# Patient Record
Sex: Female | Born: 1990 | Race: White | Hispanic: No | Marital: Married | Smoking: Never smoker
Health system: Southern US, Community
[De-identification: ages and names within clinical notes are randomized; demographics above are authoritative.]

## PROBLEM LIST (undated history)

## (undated) ENCOUNTER — Inpatient Hospital Stay: Payer: Self-pay

## (undated) DIAGNOSIS — D269 Other benign neoplasm of uterus, unspecified: Secondary | ICD-10-CM

## (undated) DIAGNOSIS — F32A Depression, unspecified: Secondary | ICD-10-CM

## (undated) DIAGNOSIS — D649 Anemia, unspecified: Secondary | ICD-10-CM

## (undated) DIAGNOSIS — F419 Anxiety disorder, unspecified: Secondary | ICD-10-CM

## (undated) DIAGNOSIS — E282 Polycystic ovarian syndrome: Secondary | ICD-10-CM

## (undated) DIAGNOSIS — J45909 Unspecified asthma, uncomplicated: Secondary | ICD-10-CM

## (undated) HISTORY — DX: Unspecified asthma, uncomplicated: J45.909

## (undated) HISTORY — DX: Anemia, unspecified: D64.9

## (undated) HISTORY — PX: WISDOM TOOTH EXTRACTION: SHX21

## (undated) HISTORY — DX: Polycystic ovarian syndrome: E28.2

## (undated) HISTORY — DX: Other benign neoplasm of uterus, unspecified: D26.9

## (undated) HISTORY — DX: Depression, unspecified: F32.A

## (undated) HISTORY — DX: Anxiety disorder, unspecified: F41.9

## (undated) HISTORY — PX: GASTRIC ROUX-EN-Y: SHX5262

---
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
## 2013-09-08 HISTORY — PX: WISDOM TOOTH EXTRACTION: SHX21

## 2017-01-04 DIAGNOSIS — E282 Polycystic ovarian syndrome: Secondary | ICD-10-CM | POA: Insufficient documentation

## 2017-02-24 HISTORY — PX: LAPAROSCOPIC GASTRIC BYPASS: SUR771

## 2017-04-13 ENCOUNTER — Encounter: Payer: Self-pay | Admitting: Obstetrics and Gynecology

## 2017-04-13 ENCOUNTER — Ambulatory Visit (INDEPENDENT_AMBULATORY_CARE_PROVIDER_SITE_OTHER): Payer: BC Managed Care – PPO | Admitting: Obstetrics and Gynecology

## 2017-04-13 VITALS — BP 110/76 | HR 83 | Ht 61.0 in | Wt 201.0 lb

## 2017-04-13 DIAGNOSIS — Z01419 Encounter for gynecological examination (general) (routine) without abnormal findings: Secondary | ICD-10-CM

## 2017-04-13 MED ORDER — NORETHIN-ETH ESTRAD-FE BIPHAS 1 MG-10 MCG / 10 MCG PO TABS: 1.0000 | ORAL_TABLET | Freq: Every day | ORAL | 3 refills | Status: DC

## 2017-04-13 NOTE — Patient Instructions (Signed)
Preventive Care 18-39 Years, Female Preventive care refers to lifestyle choices and visits with your health care provider that can promote health and wellness. What does preventive care include?  A yearly physical exam. This is also called an annual well check.  Dental exams once or twice a year.  Routine eye exams. Ask your health care provider how often you should have your eyes checked.  Personal lifestyle choices, including: ? Daily care of your teeth and gums. ? Regular physical activity. ? Eating a healthy diet. ? Avoiding tobacco and drug use. ? Limiting alcohol use. ? Practicing safe sex. ? Taking vitamin and mineral supplements as recommended by your health care provider. What happens during an annual well check? The services and screenings done by your health care provider during your annual well check will depend on your age, overall health, lifestyle risk factors, and family history of disease. Counseling Your health care provider may ask you questions about your:  Alcohol use.  Tobacco use.  Drug use.  Emotional well-being.  Home and relationship well-being.  Sexual activity.  Eating habits.  Work and work Statistician.  Method of birth control.  Menstrual cycle.  Pregnancy history.  Screening You may have the following tests or measurements:  Height, weight, and BMI.  Diabetes screening. This is done by checking your blood sugar (glucose) after you have not eaten for a while (fasting).  Blood pressure.  Lipid and cholesterol levels. These may be checked every 5 years starting at age 66.  Skin check.  Hepatitis C blood test.  Hepatitis B blood test.  Sexually transmitted disease (STD) testing.  BRCA-related cancer screening. This may be done if you have a family history of breast, ovarian, tubal, or peritoneal cancers.  Pelvic exam and Pap test. This may be done every 3 years starting at age 40. Starting at age 59, this may be done every 5  years if you have a Pap test in combination with an HPV test.  Discuss your test results, treatment options, and if necessary, the need for more tests with your health care provider. Vaccines Your health care provider may recommend certain vaccines, such as:  Influenza vaccine. This is recommended every year.  Tetanus, diphtheria, and acellular pertussis (Tdap, Td) vaccine. You may need a Td booster every 10 years.  Varicella vaccine. You may need this if you have not been vaccinated.  HPV vaccine. If you are 69 or younger, you may need three doses over 6 months.  Measles, mumps, and rubella (MMR) vaccine. You may need at least one dose of MMR. You may also need a second dose.  Pneumococcal 13-valent conjugate (PCV13) vaccine. You may need this if you have certain conditions and were not previously vaccinated.  Pneumococcal polysaccharide (PPSV23) vaccine. You may need one or two doses if you smoke cigarettes or if you have certain conditions.  Meningococcal vaccine. One dose is recommended if you are age 27-21 years and a first-year college student living in a residence hall, or if you have one of several medical conditions. You may also need additional booster doses.  Hepatitis A vaccine. You may need this if you have certain conditions or if you travel or work in places where you may be exposed to hepatitis A.  Hepatitis B vaccine. You may need this if you have certain conditions or if you travel or work in places where you may be exposed to hepatitis B.  Haemophilus influenzae type b (Hib) vaccine. You may need this if  you have certain risk factors.  Talk to your health care provider about which screenings and vaccines you need and how often you need them. This information is not intended to replace advice given to you by your health care provider. Make sure you discuss any questions you have with your health care provider. Document Released: 10/21/2001 Document Revised: 05/14/2016  Document Reviewed: 06/26/2015 Elsevier Interactive Patient Education  2017 [employer].

## 2017-04-13 NOTE — Progress Notes (Signed)
Patient ID: Wendy Johnston, female   DOB: [birth year], 26 y.o.   MRN: [id number]     Gynecology Annual Exam  PCP: Practice, [facility]  Chief Complaint:  Chief Complaint  Patient presents with  . Gynecologic Exam    History of Present Illness: Patient is a 26 y.o. G0P0000 presents for annual exam. The patient has no complaints today.   LMP: Patient's last menstrual period was 02/05/2017. Has had one menstrual cycle since her gastric bypass, lost 30lbs.  She completed 3 cycles of IUI before her bypass.  Is interested in contraception until cleared to try to achieve pregnancy by her bariatric surgeon.  The patient is sexually active. She currently uses none for contraception. She denies dyspareunia.  The patient does perform self breast exams.  There is no notable family history of breast or ovarian cancer in her family.  The patient wears seatbelts: yes.  The patient has regular exercise: not asked.    The patient denies current symptoms of depression.    Review of Systems: Review of Systems  Constitutional: Negative for chills and fever.  HENT: Negative for congestion.   Respiratory: Negative for cough and shortness of breath.   Cardiovascular: Negative for chest pain and palpitations.  Gastrointestinal: Negative for abdominal pain, constipation, diarrhea, heartburn, nausea and vomiting.  Genitourinary: Negative for dysuria, frequency and urgency.  Skin: Negative for itching and rash.  Neurological: Negative for dizziness and headaches.  Endo/Heme/Allergies: Negative for polydipsia.  Psychiatric/Behavioral: Negative for depression.    Past Medical History:  History reviewed. No pertinent past medical history.  Past Surgical History:  Past Surgical History:  Procedure Laterality Date  . 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 Contraception: none Last Pap: Results were: 03/14/15 no  abnormalities   Obstetric History: G0P0000  Family History:  History reviewed. No pertinent family history.  Social History:  Social History   Social History  . Marital status: Married    Spouse name: N/A  . Number of children: N/A  . Years of education: N/A   Occupational History  . Not on file.   Social History Main Topics  . Smoking status: Never Smoker  . Smokeless tobacco: Never Used  . Alcohol use No  . Drug use: No  . Sexual activity: Yes    Partners: Male    Birth control/ protection: None   Other Topics Concern  . Not on file   Social History Narrative  . No narrative on file    Allergies:  Allergies no known allergies  Medications: Prior to Admission medications   Medication Sig Start Date End Date Taking? Authorizing Provider  escitalopram (LEXAPRO) 10 MG tablet Take 10 mg by mouth daily.   Yes [provider]    Physical Exam Vitals: Blood pressure 110/76, pulse 83, height 5\' 1"  (1.549 m), weight 201 lb (91.2 kg), last menstrual period 02/05/2017.  General: NAD HEENT: normocephalic, anicteric Thyroid: no enlargement, no palpable nodules Pulmonary: No increased work of breathing, CTAB Cardiovascular: RRR, distal pulses 2+ Breast: Breast symmetrical, no tenderness, no palpable nodules or masses, no skin or nipple retraction present, no nipple discharge.  No axillary or supraclavicular lymphadenopathy. Abdomen: NABS, soft, non-tender, non-distended.  Umbilicus without lesions.  No hepatomegaly, splenomegaly or masses palpable. No evidence of hernia  Genitourinary:  External: Normal external female genitalia.  Normal urethral meatus, normal  Bartholin's and Skene's glands.    Vagina: Normal vaginal mucosa, no  evidence of prolapse.    Cervix: Grossly normal in appearance, no bleeding  Uterus: Non-enlarged, mobile, normal contour.  No CMT  Adnexa: ovaries non-enlarged, no adnexal masses  Rectal: deferred  Lymphatic: no evidence of inguinal  lymphadenopathy Extremities: no edema, erythema, or tenderness Neurologic: Grossly intact Psychiatric: mood appropriate, affect full  Female chaperone present for pelvic and breast  portions of the physical exam    Assessment: 26 y.o. [place] annual exam  Plan: Problem List Items Addressed This Visit    None    Visit Diagnoses    Encounter for gynecological examination without abnormal finding    -  Primary      1) 4) Gardasil Series discussed and if applicable offered to patient - Patient has previously completed 3 shot series   2) STI screening was offered and declined   3) ASCCP guidelines and rational discussed.  Patient opts for every 3 years screening interval  4) Contraception - Education given regarding options for contraception, including barrier methods, injectable contraception, IUD placement, oral contraceptives, NuvaRing, Nexplanon.  We discussed safe sex practices to reduce her furture risk of STI's.  Patient is interested in OCP  - Rx lo loestrin fe  5) Follow up 1 year for routine annual exam

## 2018-06-11 ENCOUNTER — Other Ambulatory Visit: Payer: Self-pay | Admitting: Obstetrics and Gynecology

## 2018-06-16 ENCOUNTER — Telehealth: Payer: Self-pay | Admitting: Obstetrics and Gynecology

## 2018-06-16 NOTE — Telephone Encounter (Signed)
Called and left voicemail for patient to call back to be schedule.mychart message sent

## 2018-06-16 NOTE — Telephone Encounter (Signed)
-----   Message from Malachy Mood, MD sent at 06/11/2018  8:01 PM EDT ----- Regarding: Needs annual  Patient needs annual exam.   Malachy Mood, MD, [facility], [facility] 06/11/2018, 8:01 PM

## 2018-07-06 ENCOUNTER — Other Ambulatory Visit: Payer: Self-pay

## 2018-07-06 MED ORDER — NORETHIN-ETH ESTRAD-FE BIPHAS 1 MG-10 MCG / 10 MCG PO TABS: 1.0000 | ORAL_TABLET | Freq: Every day | ORAL | 0 refills | Status: DC

## 2018-07-27 ENCOUNTER — Ambulatory Visit (INDEPENDENT_AMBULATORY_CARE_PROVIDER_SITE_OTHER): Payer: BC Managed Care – PPO | Admitting: Obstetrics and Gynecology

## 2018-07-27 ENCOUNTER — Other Ambulatory Visit (HOSPITAL_COMMUNITY)
Admission: RE | Admit: 2018-07-27 | Discharge: 2018-07-27 | Disposition: A | Discharge status: Home / Self Care | Payer: BC Managed Care – PPO | Source: Ambulatory Visit | Attending: Obstetrics and Gynecology | Admitting: Obstetrics and Gynecology

## 2018-07-27 ENCOUNTER — Encounter: Payer: Self-pay | Admitting: Obstetrics and Gynecology

## 2018-07-27 VITALS — BP 110/60 | HR 82 | Wt 160.0 lb

## 2018-07-27 DIAGNOSIS — Z01419 Encounter for gynecological examination (general) (routine) without abnormal findings: Secondary | ICD-10-CM | POA: Diagnosis not present

## 2018-07-27 DIAGNOSIS — Z124 Encounter for screening for malignant neoplasm of cervix: Secondary | ICD-10-CM

## 2018-07-27 DIAGNOSIS — Z113 Encounter for screening for infections with a predominantly sexual mode of transmission: Secondary | ICD-10-CM | POA: Diagnosis present

## 2018-07-27 DIAGNOSIS — Z1239 Encounter for other screening for malignant neoplasm of breast: Secondary | ICD-10-CM

## 2018-07-27 MED ORDER — NORETHIN-ETH ESTRAD-FE BIPHAS 1 MG-10 MCG / 10 MCG PO TABS: 1.0000 | ORAL_TABLET | Freq: Every day | ORAL | 3 refills | Status: DC

## 2018-07-27 NOTE — Progress Notes (Signed)
Gynecology Annual Exam   PCP: Practice, [facility]  Chief Complaint:  Chief Complaint  Patient presents with  . Gynecologic Exam    History of Present Illness: Patient is a 27 y.o. G0P0000 presents for annual exam. The patient has no complaints today.   LMP: Patient's last menstrual period was 07/07/2018 (exact date). Average Interval: regular, 28 days Duration of flow: 3 days Heavy Menses: no Clots: no Intermenstrual Bleeding: no Postcoital Bleeding: no Dysmenorrhea: no  The patient is sexually active. She currently uses OCP (estrogen/progesterone) for contraception. She denies dyspareunia.  The patient does perform self breast exams.  There is no notable family history of breast or ovarian cancer in her family.  The patient wears seatbelts: yes.   The patient has regular exercise: not asked.    The patient denies current symptoms of depression.    Review of Systems: Review of Systems  Constitutional: Negative for chills and fever.  HENT: Negative for congestion.   Respiratory: Negative for cough and shortness of breath.   Cardiovascular: Negative for chest pain and palpitations.  Gastrointestinal: Negative for abdominal pain, constipation, diarrhea, heartburn, nausea and vomiting.  Genitourinary: Negative for dysuria, frequency and urgency.  Skin: Negative for itching and rash.  Neurological: Negative for dizziness and headaches.  Endo/Heme/Allergies: Negative for polydipsia.  Psychiatric/Behavioral: Negative for depression.    Past Medical History:  History reviewed. No pertinent past medical history.  Past Surgical History:  Past Surgical History:  Procedure Laterality Date  . 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 Contraception: OCP (estrogen/progesterone) Last Pap: Results were:03/13/16 no abnormalities   Obstetric History: G0P0000  Family History:    Family History  Problem Relation Age of Onset  . Cervical cancer Mother 59       total hysterectomy    Social History:  Social History   Socioeconomic History  . Marital status: Married    Spouse name: Not on file  . Number of children: Not on file  . Years of education: Not on file  . Highest education level: Not on file  Occupational History  . Not on file  Social Needs  . Financial resource strain: Not on file  . Food insecurity:    Worry: Not on file    Inability: Not on file  . Transportation needs:    Medical: Not on file    Non-medical: Not on file  Tobacco Use  . Smoking status: Never Smoker  . Smokeless tobacco: Never Used  Substance and Sexual Activity  . Alcohol use: No  . Drug use: No  . Sexual activity: Yes    Partners: Male    Birth control/protection: Pill  Lifestyle  . Physical activity:    Days per week: Not on file    Minutes per session: Not on file  . Stress: Not on file  Relationships  . Social connections:    Talks on phone: Not on file    Gets together: Not on file    Attends religious service: Not on file    Active member of club or organization: Not on file    Attends meetings of clubs or organizations: Not on file    Relationship status: Not on file  . Intimate partner violence:    Fear of current or ex partner: Not on file    Emotionally abused: Not on file    Physically abused: Not on file  Forced sexual activity: Not on file  Other Topics Concern  . Not on file  Social History Narrative  . Not on file    Allergies:  No Known Allergies  Medications: Prior to Admission medications   Medication Sig Start Date End Date Taking? Authorizing Provider  escitalopram (LEXAPRO) 10 MG tablet Take 10 mg by mouth daily.   Yes [provider]  Norethindrone-Ethinyl Estradiol-Fe Biphas (LO LOESTRIN FE) 1 MG-10 MCG / 10 MCG tablet Take 1 tablet by mouth daily. 07/06/18  Yes Malachy Mood, MD    Physical Exam Vitals:  Blood pressure 110/60, pulse 82, weight 160 lb (72.6 kg), last menstrual period 07/07/2018.  General: NAD HEENT: normocephalic, anicteric Thyroid: no enlargement, no palpable nodules Pulmonary: No increased work of breathing, CTAB Cardiovascular: RRR, distal pulses 2+ Breast: Breast symmetrical, no tenderness, no palpable nodules or masses, no skin or nipple retraction present, no nipple discharge.  No axillary or supraclavicular lymphadenopathy. Abdomen: NABS, soft, non-tender, non-distended.  Umbilicus without lesions.  No hepatomegaly, splenomegaly or masses palpable. No evidence of hernia  Genitourinary:  External: Normal external female genitalia.  Normal urethral meatus, normal Bartholin's and Skene's glands.    Vagina: Normal vaginal mucosa, no evidence of prolapse.    Cervix: Grossly normal in appearance, no bleeding  Uterus: Non-enlarged, mobile, normal contour.  No CMT  Adnexa: ovaries non-enlarged, no adnexal masses  Rectal: deferred  Lymphatic: no evidence of inguinal lymphadenopathy Extremities: no edema, erythema, or tenderness Neurologic: Grossly intact Psychiatric: mood appropriate, affect full  Female chaperone present for pelvic and breast  portions of the physical exam    Assessment: 27 y.o. G0P0000 routine annual exam  Plan: Problem List Items Addressed This Visit    None    Visit Diagnoses    Encounter for gynecological examination without abnormal finding    -  Primary   Screening for malignant neoplasm of cervix       Relevant Orders   HEP, RPR, HIV Panel   Breast screening       Routine screening for STI (sexually transmitted infection)       Relevant Orders   Cytology - PAP   HEP, RPR, HIV Panel      2) STI screening  wasoffered and accepted  2)  ASCCP guidelines and rational discussed.  Patient opts for every 3 years screening interval  3) Contraception - the patient is currently using  OCP (estrogen/progesterone).  She is interested in  starting Contraception: IUD.  Patient plans IUD, and will schedule soon.  Risks and benefits of IUD discussed including the risks of irregular bleeding, cramping, infection, malpositioning, expulsion or uterine perforation of the IUD (1:1000 placements)  which may require further procedures such as laparoscopy.  IUDs while effective at preventing pregnancy do not prevent transmission of sexually transmitted diseases and use of barrier methods for this purpose was discussed.  Low overall incidence of failure with 99.7% efficacy rate in typical use.  The patient has not contraindication to IUD placement.  4) Routine healthcare maintenance including cholesterol, diabetes screening discussed managed by PCP  5) Return in about 1 year (around 07/28/2019) for annual.   Malachy Mood, MD, [place] 07/27/2018, 3:11 PM

## 2018-07-27 NOTE — Patient Instructions (Signed)
Preventive Care 18-39 Years, Female Preventive care refers to lifestyle choices and visits with your health care provider that can promote health and wellness. What does preventive care include?  A yearly physical exam. This is also called an annual well check.  Dental exams once or twice a year.  Routine eye exams. Ask your health care provider how often you should have your eyes checked.  Personal lifestyle choices, including: ? Daily care of your teeth and gums. ? Regular physical activity. ? Eating a healthy diet. ? Avoiding tobacco and drug use. ? Limiting alcohol use. ? Practicing safe sex. ? Taking vitamin and mineral supplements as recommended by your health care provider. What happens during an annual well check? The services and screenings done by your health care provider during your annual well check will depend on your age, overall health, lifestyle risk factors, and family history of disease. Counseling Your health care provider may ask you questions about your:  Alcohol use.  Tobacco use.  Drug use.  Emotional well-being.  Home and relationship well-being.  Sexual activity.  Eating habits.  Work and work Statistician.  Method of birth control.  Menstrual cycle.  Pregnancy history.  Screening You may have the following tests or measurements:  Height, weight, and BMI.  Diabetes screening. This is done by checking your blood sugar (glucose) after you have not eaten for a while (fasting).  Blood pressure.  Lipid and cholesterol levels. These may be checked every 5 years starting at age 66.  Skin check.  Hepatitis C blood test.  Hepatitis B blood test.  Sexually transmitted disease (STD) testing.  BRCA-related cancer screening. This may be done if you have a family history of breast, ovarian, tubal, or peritoneal cancers.  Pelvic exam and Pap test. This may be done every 3 years starting at age 40. Starting at age 59, this may be done every 5  years if you have a Pap test in combination with an HPV test.  Discuss your test results, treatment options, and if necessary, the need for more tests with your health care provider. Vaccines Your health care provider may recommend certain vaccines, such as:  Influenza vaccine. This is recommended every year.  Tetanus, diphtheria, and acellular pertussis (Tdap, Td) vaccine. You may need a Td booster every 10 years.  Varicella vaccine. You may need this if you have not been vaccinated.  HPV vaccine. If you are 69 or younger, you may need three doses over 6 months.  Measles, mumps, and rubella (MMR) vaccine. You may need at least one dose of MMR. You may also need a second dose.  Pneumococcal 13-valent conjugate (PCV13) vaccine. You may need this if you have certain conditions and were not previously vaccinated.  Pneumococcal polysaccharide (PPSV23) vaccine. You may need one or two doses if you smoke cigarettes or if you have certain conditions.  Meningococcal vaccine. One dose is recommended if you are age 27-21 years and a first-year college student living in a residence hall, or if you have one of several medical conditions. You may also need additional booster doses.  Hepatitis A vaccine. You may need this if you have certain conditions or if you travel or work in places where you may be exposed to hepatitis A.  Hepatitis B vaccine. You may need this if you have certain conditions or if you travel or work in places where you may be exposed to hepatitis B.  Haemophilus influenzae type b (Hib) vaccine. You may need this if  you have certain risk factors.  Talk to your health care provider about which screenings and vaccines you need and how often you need them. This information is not intended to replace advice given to you by your health care provider. Make sure you discuss any questions you have with your health care provider. Document Released: 10/21/2001 Document Revised: 05/14/2016  Document Reviewed: 06/26/2015 Elsevier Interactive Patient Education  [employer].

## 2018-07-28 LAB — HEP, RPR, HIV PANEL
HEP B S AG: NEGATIVE
HIV SCREEN 4TH GENERATION: NONREACTIVE
RPR: NONREACTIVE

## 2018-07-30 LAB — CYTOLOGY - PAP
CHLAMYDIA, DNA PROBE: NEGATIVE
Diagnosis: NEGATIVE
NEISSERIA GONORRHEA: NEGATIVE

## 2020-07-06 ENCOUNTER — Ambulatory Visit: Payer: BC Managed Care – PPO | Admitting: Obstetrics and Gynecology

## 2020-08-27 ENCOUNTER — Other Ambulatory Visit: Payer: Self-pay

## 2020-08-27 ENCOUNTER — Encounter: Payer: Self-pay | Admitting: Obstetrics and Gynecology

## 2020-08-27 ENCOUNTER — Ambulatory Visit (INDEPENDENT_AMBULATORY_CARE_PROVIDER_SITE_OTHER): Payer: BC Managed Care – PPO | Admitting: Obstetrics and Gynecology

## 2020-08-27 VITALS — BP 118/82 | Ht 61.0 in | Wt 169.0 lb

## 2020-08-27 DIAGNOSIS — Z6830 Body mass index (BMI) 30.0-30.9, adult: Secondary | ICD-10-CM

## 2020-08-27 DIAGNOSIS — Z01419 Encounter for gynecological examination (general) (routine) without abnormal findings: Secondary | ICD-10-CM | POA: Diagnosis not present

## 2020-08-27 DIAGNOSIS — Z3041 Encounter for surveillance of contraceptive pills: Secondary | ICD-10-CM

## 2020-08-27 DIAGNOSIS — Z713 Dietary counseling and surveillance: Secondary | ICD-10-CM

## 2020-08-27 DIAGNOSIS — E669 Obesity, unspecified: Secondary | ICD-10-CM

## 2020-08-27 DIAGNOSIS — Z1239 Encounter for other screening for malignant neoplasm of breast: Secondary | ICD-10-CM | POA: Diagnosis not present

## 2020-08-27 MED ORDER — PHENTERMINE HCL 37.5 MG PO TABS: 37.5000 mg | ORAL_TABLET | Freq: Every day | ORAL | 0 refills | Status: DC

## 2020-08-27 MED ORDER — MEDROXYPROGESTERONE ACETATE 10 MG PO TABS: 10.0000 mg | ORAL_TABLET | Freq: Every day | ORAL | 0 refills | Status: DC

## 2020-08-27 NOTE — Patient Instructions (Signed)
Wendy [email or web address]

## 2020-08-27 NOTE — Progress Notes (Signed)
Gynecology Annual Exam   PCP: Practice, [facility]  Chief Complaint:  Chief Complaint  Patient presents with  . Gynecologic Exam    Annual - Iron low having syncopal spells on menstrual, refill cymbalta. RM 5     History of Present Illness: Patient is a 29 y.o. G0P0000 presents for annual exam. The patient has no complaints today.   LMP: Patient's last menstrual period was 07/19/2020. No menstrual complaints  The patient is sexually active. She currently uses none for contraception. Discontinued Lo Loestrin FE in February.  She denies dyspareunia.  The patient does perform self breast exams.  There is no notable family history of breast or ovarian cancer in her family.  The patient wears seatbelts: yes.   The patient has regular exercise: not asked.    The patient denies current symptoms of depression.     Patientis a 29 y.o. [place] female, who presents for the evaluation of weight gain. She has gained 10 pounds primarily over 2 year. The patient states the following issues have contributed to her weight problem: lifestyle limitations.  The patient has no additional symptoms. The patient specifically denies memory loss, muscle weakness, excessive thirst, and polyuria. Weight related co-morbidities include PCOS. The patient's past medical history is notable for non-contributory.   Review of Systems: Review of Systems  Constitutional: Negative for chills and fever.  HENT: Negative for congestion.   Respiratory: Negative for cough and shortness of breath.   Cardiovascular: Negative for chest pain and palpitations.  Gastrointestinal: Negative for abdominal pain, constipation, diarrhea, heartburn, nausea and vomiting.  Genitourinary: Negative for dysuria, frequency and urgency.  Skin: Negative for itching and rash.  Neurological: Negative for dizziness and headaches.  Endo/Heme/Allergies: Negative for polydipsia.  Psychiatric/Behavioral: Negative for depression.     Past Medical History:  There are no problems to display for this patient.   Past Surgical History:  Past Surgical History:  Procedure Laterality Date  . LAPAROSCOPIC GASTRIC BYPASS  02/24/2017    Gynecologic History:  Patient's last menstrual period was 07/19/2020. Contraception: OCP (estrogen/progesterone) Last Pap: Results were:07/27/2018 no abnormalities   Obstetric History: G0P0000  Family History:  Family History  Problem Relation Age of Onset  . Cervical cancer Mother 47       total hysterectomy    Social History:  Social History   Socioeconomic History  . Marital status: Married    Spouse name: Not on file  . Number of children: Not on file  . Years of education: Not on file  . Highest education level: Not on file  Occupational History  . Not on file  Tobacco Use  . Smoking status: Never Smoker  . Smokeless tobacco: Never Used  Vaping Use  . Vaping Use: Never used  Substance and Sexual Activity  . Alcohol use: No  . Drug use: No  . Sexual activity: Yes    Partners: Male    Birth control/protection: Pill  Other Topics Concern  . Not on file  Social History Narrative  . Not on file   Social Determinants of Health   Financial Resource Strain: Not on file  Food Insecurity: Not on file  Transportation Needs: Not on file  Physical Activity: Not on file  Stress: Not on file  Social Connections: Not on file  Intimate Partner Violence: Not on file    Allergies:  No Known Allergies  Medications: Prior to Admission medications   Medication Sig Start Date End Date Taking? Authorizing Provider  Norethindrone-Ethinyl Estradiol-Fe Biphas (LO LOESTRIN FE) 1 MG-10 MCG / 10 MCG tablet Take 1 tablet by mouth daily. 07/27/18   Malachy Mood, MD    Physical Exam Vitals:  Blood pressure 118/82, height 5\' 1"  (1.549 m), weight 169 lb (76.7 kg), last menstrual period 07/19/2020.  Body mass index is 31.93 kg/m.  General: NAD HEENT: normocephalic,  anicteric Thyroid: no enlargement, no palpable nodules Pulmonary: No increased work of breathing, CTAB Cardiovascular: RRR, distal pulses 2+ Breast: Breast symmetrical, no tenderness, no palpable nodules or masses, no skin or nipple retraction present, no nipple discharge.  No axillary or supraclavicular lymphadenopathy. Abdomen: NABS, soft, non-tender, non-distended.  Umbilicus without lesions.  No hepatomegaly, splenomegaly or masses palpable. No evidence of hernia  Genitourinary:  External: Normal external female genitalia.  Normal urethral meatus, normal Bartholin's and Skene's glands.    Vagina: Normal vaginal mucosa, no evidence of prolapse.    Cervix: Grossly normal in appearance, no bleeding  Uterus: Non-enlarged, mobile, normal contour.  No CMT  Adnexa: ovaries non-enlarged, no adnexal masses  Rectal: deferred  Lymphatic: no evidence of inguinal lymphadenopathy Extremities: no edema, erythema, or tenderness Neurologic: Grossly intact Psychiatric: mood appropriate, affect full  Female chaperone present for pelvic and breast  portions of the physical exam  Immunization History  Administered Date(s) Administered  . Moderna Sars-Covid-2 Vaccination 11/03/2019, 12/01/2019    Assessment: 29 y.o. G0P0000 routine annual exam  Plan: Problem List Items Addressed This Visit   None   Visit Diagnoses    Encounter for weight loss counseling    -  Primary   Breast screening       Oral contraceptive pill surveillance       Class 1 obesity without serious comorbidity with body mass index (BMI) of 30.0 to 30.9 in adult, unspecified obesity type       Relevant Medications   phentermine (ADIPEX-P) 37.5 MG tablet     Annual  1) STI screening  was notoffered and therefore not obtained  2)  ASCCP guidelines and rational discussed.  Patient opts for every 3 years screening interval  3) Contraception - the patient is currently using  none.  She is attempting to conceive in the near  future New partner in [place] until February interested in [place] course +/- metformin Provera to start menses  4) Routine healthcare maintenance including cholesterol, diabetes screening discussed managed by PCP   5) Return in about 4 weeks (around 09/24/2020) for medication follow up phone.   Obesity   1) 1500 Calorie ADA Diet  2) Patient education given regarding appropriate lifestyle changes for weight loss including: regular physical activity, healthy coping strategies, caloric restriction and healthy eating patterns.  3) Patient will be started on weight loss medication. The risks and benefits and side effects of medication, such as Adipex (Phenteramine) ,  Tenuate (Diethylproprion), Belviq (lorcarsin), Contrave (buproprion/naltrexone), Qsymia (phentermine/topiramate), and Saxenda (liraglutide) is discussed. The pros and cons of suppressing appetite and boosting metabolism is discussed. Risks of tolerence and addiction is discussed for selected agents discussed. Use of medicine will ne short term, such as 3-4 months at a time followed by a period of time off of the medicine to avoid these risks and side effects for Adipex, Qsymia, and Tenuate discussed. Pt to call with any negative side effects and agrees to keep follow up appts.  4) Comorbidity Screening - hypothyroidism screening, diabetes, and hyperlipidemia screening offered  5) Encouraged weekly weight monitorig to track progress and sample 1 week food diary  6) 15 minutes face-to-face; counseling/coordination of care > 50 percent of visit  7) Follow up in 4 weeks to assess response   Malachy Mood, MD, [facility] 08/27/2020, 11:35 AM

## 2020-09-24 ENCOUNTER — Ambulatory Visit: Payer: BC Managed Care – PPO | Admitting: Obstetrics and Gynecology

## 2020-09-27 ENCOUNTER — Ambulatory Visit (INDEPENDENT_AMBULATORY_CARE_PROVIDER_SITE_OTHER): Payer: Self-pay | Admitting: Obstetrics and Gynecology

## 2020-09-27 ENCOUNTER — Other Ambulatory Visit: Payer: Self-pay

## 2020-09-27 VITALS — Wt 159.0 lb

## 2020-09-27 DIAGNOSIS — Z6830 Body mass index (BMI) 30.0-30.9, adult: Secondary | ICD-10-CM

## 2020-09-27 DIAGNOSIS — E669 Obesity, unspecified: Secondary | ICD-10-CM

## 2020-09-27 MED ORDER — PHENTERMINE HCL 37.5 MG PO TABS: 37.5000 mg | ORAL_TABLET | Freq: Every day | ORAL | 0 refills | Status: DC

## 2020-09-27 MED ORDER — METFORMIN HCL 500 MG PO TABS: 500.0000 mg | ORAL_TABLET | Freq: Two times a day (BID) | ORAL | 5 refills | Status: DC

## 2020-09-27 NOTE — Progress Notes (Signed)
I connected with Wendy Johnston on 09/27/20 at  3:10 PM EST by telephone and verified that I am speaking with the correct person using two identifiers.   I discussed the limitations, risks, security and privacy concerns of performing an evaluation and management service by telephone and the availability of in person appointments. I also discussed with the patient that there may be a patient responsible charge related to this service. The patient expressed understanding and agreed to proceed.  The patient was at home I spoke with the patient from my workstation phone The names of people involved in this encounter were: Jodi Mourning , and Malachy Mood    Gynecology Office Visit  Chief Complaint: No chief complaint on file.   History of Present Illness: Patientis a 30 y.o. [place] female, who presents for the evaluation of the desire to lose weight. She has lost 10lbs pounds 1 months. The patient states the following symptoms since starting her weight loss therapy: appetite suppression, energy, and weight loss.  The patient also reports no other ill effects. The patient specifically denies heart palpitations, anxiety, and insomnia.    Review of Systems: 10 point review of systems negative unless otherwise noted in HPI  Past Medical History:  No past medical history on file.  Past Surgical History:  Past Surgical History:  Procedure Laterality Date  . LAPAROSCOPIC GASTRIC BYPASS  02/24/2017    Gynecologic History: No LMP recorded. (Menstrual status: Oral contraceptives).  Obstetric History: G0P0000  Family History:  Family History  Problem Relation Age of Onset  . Cervical cancer Mother 13       total hysterectomy    Social History:  Social History   Socioeconomic History  . Marital status: Married    Spouse name: Not on file  . Number of children: Not on file  . Years of education: Not on file  . Highest education level: Not on file  Occupational History   . Not on file  Tobacco Use  . Smoking status: Never Smoker  . Smokeless tobacco: Never Used  Vaping Use  . Vaping Use: Never used  Substance and Sexual Activity  . Alcohol use: No  . Drug use: No  . Sexual activity: Yes    Partners: Male    Birth control/protection: Pill  Other Topics Concern  . Not on file  Social History Narrative  . Not on file   Social Determinants of Health   Financial Resource Strain: Not on file  Food Insecurity: Not on file  Transportation Needs: Not on file  Physical Activity: Not on file  Stress: Not on file  Social Connections: Not on file  Intimate Partner Violence: Not on file    Allergies:  No Known Allergies  Medications: Prior to Admission medications   Medication Sig Start Date End Date Taking? Authorizing Provider  DULoxetine (CYMBALTA) 60 MG capsule Take 60 mg by mouth daily.    [provider]  medroxyPROGESTERone (PROVERA) 10 MG tablet Take 1 tablet (10 mg total) by mouth daily. 08/27/20   Malachy Mood, MD  phentermine (ADIPEX-P) 37.5 MG tablet Take 1 tablet (37.5 mg total) by mouth daily before breakfast. 08/27/20   Malachy Mood, MD    Physical Exam There were no vitals taken for this visit. Wt Readings from Last 3 Encounters:  09/27/20 159 lb (72.1 kg)  08/27/20 169 lb (76.7 kg)  07/27/18 160 lb (72.6 kg)  Body mass index is 30.04 kg/m.  No physical exam as  this was a remote telephone visit to promote social distancing during the current COVID-19 Pandemic  Assessment: 30 y.o. G0P0000 follow up medical weight loss  Plan: Problem List Items Addressed This Visit   None   Visit Diagnoses    Class 1 obesity without serious comorbidity with body mass index (BMI) of 30.0 to 30.9 in adult, unspecified obesity type    -  Primary   Relevant Medications   metFORMIN (GLUCOPHAGE) 500 MG tablet   phentermine (ADIPEX-P) 37.5 MG tablet      1) 1500 Calorie ADA Diet  2) Patient education given regarding  appropriate lifestyle changes for weight loss including: regular physical activity, healthy coping strategies, caloric restriction and healthy eating patterns.  3) Patient will be started on weight loss medication. The risks and benefits and side effects of medication, such as Adipex (Phenteramine) ,  Tenuate (Diethylproprion), Belviq (lorcarsin), Contrave (buproprion/naltrexone), Qsymia (phentermine/topiramate), and Saxenda (liraglutide) is discussed. The pros and cons of suppressing appetite and boosting metabolism is discussed. Risks of tolerence and addiction is discussed for selected agents discussed. Use of medicine will ne short term, such as 3-4 months at a time followed by a period of time off of the medicine to avoid these risks and side effects for Adipex, Qsymia, and Tenuate discussed. Pt to call with any negative side effects and agrees to keep follow up appts.  4) Patient to take medication, with the benefits of appetite suppression and metabolism boost d/w pt, along with the side effects and risk factors of long term use that will be avoided with our use of short bursts of therapy. Rx provided. Will also add metformin 500mg  po bid.  Still no menses despite provera course.     5) Telephone 9:6min   6)  Return in about 4 weeks (around 10/25/2020) for medication follow.    Malachy Mood, MD, [facility], [facility] 09/27/2020, 3:40 PM

## 2020-10-01 ENCOUNTER — Other Ambulatory Visit: Payer: Self-pay | Admitting: Obstetrics and Gynecology

## 2020-10-01 DIAGNOSIS — N970 Female infertility associated with anovulation: Secondary | ICD-10-CM

## 2020-10-01 MED ORDER — LETROZOLE 2.5 MG PO TABS: 2.5000 mg | ORAL_TABLET | Freq: Every day | ORAL | 0 refills | Status: AC

## 2020-10-01 NOTE — Progress Notes (Signed)
LMP 09/30/2020 cycle I letrozle 2.5mg  po with day 21 progesterone 10/22/2020

## 2020-10-22 ENCOUNTER — Other Ambulatory Visit: Payer: BC Managed Care – PPO

## 2020-10-22 ENCOUNTER — Other Ambulatory Visit: Payer: Self-pay

## 2020-10-22 DIAGNOSIS — N970 Female infertility associated with anovulation: Secondary | ICD-10-CM

## 2020-10-23 LAB — PROGESTERONE: Progesterone: 11.7 ng/mL

## 2020-11-16 ENCOUNTER — Telehealth: Payer: Self-pay | Admitting: Oncology

## 2020-11-16 NOTE — Telephone Encounter (Signed)
Patient referred by Charlott Holler, FNP-C for Iron Def/Anemia.  Appt made for 11/26/20 Labs 10:30 am - Consult 11:00 am

## 2020-11-25 ENCOUNTER — Other Ambulatory Visit: Payer: Self-pay | Admitting: Oncology

## 2020-11-25 DIAGNOSIS — D508 Other iron deficiency anemias: Secondary | ICD-10-CM

## 2020-11-25 NOTE — Progress Notes (Signed)
[facility]  [address] [place],  [place]  [address] [phone]  Clinic Day:  11/26/2020  Referring physician: Practice, Darel [facility]*   HISTORY OF PRESENT ILLNESS:  The patient is a 30 y.o. female  who I was asked to consult upon for iron deficiency anemia.  The patient clams to have a long history of this.  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  She also had gastric bypass surgery a few years ago.   She has been more fatigued recently and has cravings for ice.  Of note, she took iron for years, but it led to severe hemorrhoids and rectal bleeding.  Furthermore, as it caused her GI upset, she eventually stopped taking iron pills.     PAST MEDICAL HISTORY:   Past Medical History:  Diagnosis Date  . Anemia   . Anxiety   . Depression   . Uterine adenomyoma    JUVENILE CYSTIC    PAST SURGICAL HISTORY:   Past Surgical History:  Procedure Laterality Date  . GASTRIC ROUX-EN-Y    . LAPAROSCOPIC GASTRIC BYPASS  02/24/2017  . WISDOM TOOTH EXTRACTION      CURRENT MEDICATIONS:   Current Outpatient Medications  Medication Sig Dispense Refill  . DULoxetine (CYMBALTA) 60 MG capsule Take 60 mg by mouth daily.    . medroxyPROGESTERone (PROVERA) 10 MG tablet Take 1 tablet (10 mg total) by mouth daily. 10 tablet 0  . metFORMIN (GLUCOPHAGE) 500 MG tablet Take 1 tablet (500 mg total) by mouth in the morning and at bedtime. Take one tablet by mouth daily for one week. Then increase to one tablet twice a day for one week.  Then two tablets twice a day. 60 tablet 5  . phentermine (ADIPEX-P) 37.5 MG tablet Take 1 tablet (37.5 mg total) by mouth daily before breakfast. 30 tablet 0   No current facility-administered medications for this visit.    ALLERGIES:  No Known Allergies  FAMILY HISTORY:   Family History  Problem Relation Age of Onset  . Cervical cancer Mother 57       total  hysterectomy    SOCIAL HISTORY:  The patient was born and raised in [place].  She is married to her 2nd husband.  She is an Automotive engineer.  There is no history of alcohol or tobacco abuse.    REVIEW OF SYSTEMS:  Review of Systems  Constitutional: Positive for fatigue. Negative for fever.       Night sweats  HENT:   Negative for hearing loss and sore throat.   Eyes: Negative for eye problems.  Respiratory: Negative for chest tightness, cough and hemoptysis.   Cardiovascular: Negative for chest pain and palpitations.  Gastrointestinal: Positive for constipation. Negative for abdominal distention, abdominal pain, blood in stool, diarrhea, nausea and vomiting.  Endocrine: Negative for hot flashes.  Genitourinary: Negative for difficulty urinating, dysuria, frequency, hematuria and nocturia.   Musculoskeletal: Negative for arthralgias, back pain, gait problem and myalgias.       Restless legs  Skin: Negative.  Negative for itching and rash.  Neurological: Negative.  Negative for dizziness, extremity weakness, gait problem, headaches, light-headedness and numbness.  Hematological: Negative.   Psychiatric/Behavioral: Negative.  Negative for depression and suicidal ideas. The patient is not nervous/anxious.      PHYSICAL EXAM:  Blood pressure 127/72, pulse (!) 58, temperature 98 F (36.7 C), resp. rate 14, height 5\' 1"  (1.549  m), weight 163 lb 14.4 oz (74.3 kg), SpO2 100 %. Wt Readings from Last 3 Encounters:  11/26/20 163 lb 14.4 oz (74.3 kg)  09/27/20 159 lb (72.1 kg)  08/27/20 169 lb (76.7 kg)   Body mass index is 30.97 kg/m. Performance status (ECOG): 0 - Asymptomatic Physical Exam Constitutional:      Appearance: Normal appearance. She is not ill-appearing.  HENT:     Mouth/Throat:     Mouth: Mucous membranes are moist.     Pharynx: Oropharynx is clear. No oropharyngeal exudate or posterior oropharyngeal erythema.  Cardiovascular:     Rate and Rhythm:  Normal rate and regular rhythm.     Heart sounds: No murmur heard. No friction rub. No gallop.   Pulmonary:     Effort: Pulmonary effort is normal. No respiratory distress.     Breath sounds: Normal breath sounds. No wheezing, rhonchi or rales.  Chest:  Breasts:     Right: No axillary adenopathy or supraclavicular adenopathy.     Left: No axillary adenopathy or supraclavicular adenopathy.    Abdominal:     General: Bowel sounds are normal. There is no distension.     Palpations: Abdomen is soft. There is no mass.     Tenderness: There is no abdominal tenderness.  Musculoskeletal:        General: No swelling.     Right lower leg: No edema.     Left lower leg: No edema.  Lymphadenopathy:     Cervical: No cervical adenopathy.     Upper Body:     Right upper body: No supraclavicular or axillary adenopathy.     Left upper body: No supraclavicular or axillary adenopathy.     Lower Body: No right inguinal adenopathy. No left inguinal adenopathy.  Skin:    General: Skin is warm.     Coloration: Skin is not jaundiced.     Findings: No lesion or rash.  Neurological:     General: No focal deficit present.     Mental Status: She is alert and oriented to person, place, and time. Mental status is at baseline.     Cranial Nerves: Cranial nerves are intact.  Psychiatric:        Mood and Affect: Mood normal.        Behavior: Behavior normal.        Thought Content: Thought content normal.    LABS:    Ref. Range 11/26/2020 00:00 11/26/2020 10:46  Iron Latest Ref Range: 28 - 170 ug/dL  20 (L)  UIBC Latest Units: ug/dL  475  TIBC Latest Ref Range: 250 - 450 ug/dL  495 (H)  Saturation Ratios Latest Ref Range: 10.4 - 31.8 %  4 (L)  Ferritin Latest Ref Range: 11 - 307 ng/mL  4 (L)  WBC Unknown 4.8   RBC Latest Ref Range: 3.87 - 5.11  4.44   Hemoglobin Latest Ref Range: 12.0 - 16.0  9.3 (A)   HCT Latest Ref Range: 36 - 46  30 (A)   MCV Latest Ref Range: 81 - 99  67 (A)   Platelets Latest  Ref Range: 150 - 399  322     ASSESSMENT & PLAN:  A 30 y.o. female who I was asked to consult upon for iron deficiency anemia, likely related to her previous gastric bypass surgery.  She understands this surgery irreversibly affects how iron is absorbed through the stomach.  To counteract this problem, I will arrange for her to receive IV Venofer  over the next few weeks to rapidly replenish her iron stores and normalize her hemoglobin.  I will see her back in 3 months to reassess her iron and hemoglobin levels to see how well she responded to her upcoming IV Venofer.  The patient understands all the plans discussed today and is in agreement with them.  I do appreciate Practice, Darel Hong* for his new consult.   Guilherme Schwenke Macarthur Critchley, MD

## 2020-11-26 ENCOUNTER — Inpatient Hospital Stay: Payer: BC Managed Care – PPO | Attending: Oncology

## 2020-11-26 ENCOUNTER — Other Ambulatory Visit: Payer: Self-pay | Admitting: Oncology

## 2020-11-26 ENCOUNTER — Other Ambulatory Visit: Payer: Self-pay

## 2020-11-26 ENCOUNTER — Encounter: Payer: Self-pay | Admitting: Oncology

## 2020-11-26 ENCOUNTER — Other Ambulatory Visit: Payer: Self-pay | Admitting: Hematology and Oncology

## 2020-11-26 ENCOUNTER — Inpatient Hospital Stay (INDEPENDENT_AMBULATORY_CARE_PROVIDER_SITE_OTHER): Payer: BC Managed Care – PPO | Admitting: Oncology

## 2020-11-26 DIAGNOSIS — D509 Iron deficiency anemia, unspecified: Secondary | ICD-10-CM | POA: Insufficient documentation

## 2020-11-26 DIAGNOSIS — D508 Other iron deficiency anemias: Secondary | ICD-10-CM | POA: Diagnosis not present

## 2020-11-26 LAB — CBC AND DIFFERENTIAL
HCT: 30 — AB (ref 36–46)
Hemoglobin: 9.3 — AB (ref 12.0–16.0)
Neutrophils Absolute: 2.64
Platelets: 322 (ref 150–399)
WBC: 4.8

## 2020-11-26 LAB — IRON AND TIBC
Iron: 20 ug/dL — ABNORMAL LOW (ref 28–170)
Saturation Ratios: 4 % — ABNORMAL LOW (ref 10.4–31.8)
TIBC: 495 ug/dL — ABNORMAL HIGH (ref 250–450)
UIBC: 475 ug/dL

## 2020-11-26 LAB — FERRITIN: Ferritin: 4 ng/mL — ABNORMAL LOW (ref 11–307)

## 2020-11-26 LAB — CBC
MCV: 67 — AB (ref 81–99)
RBC: 4.44 (ref 3.87–5.11)

## 2020-12-05 ENCOUNTER — Telehealth: Payer: Self-pay | Admitting: Oncology

## 2020-12-05 NOTE — Telephone Encounter (Signed)
Scheduled per 3/29 sch msg. Called and spoke with pt, confirmed all added appts

## 2020-12-07 ENCOUNTER — Other Ambulatory Visit: Payer: Self-pay | Admitting: Obstetrics and Gynecology

## 2020-12-07 MED ORDER — LETROZOLE 2.5 MG PO TABS: 2.5000 mg | ORAL_TABLET | Freq: Every day | ORAL | 0 refills | Status: AC

## 2020-12-07 MED ORDER — MEDROXYPROGESTERONE ACETATE 10 MG PO TABS: 10.0000 mg | ORAL_TABLET | Freq: Every day | ORAL | 0 refills | Status: DC

## 2020-12-10 ENCOUNTER — Inpatient Hospital Stay: Payer: BC Managed Care – PPO | Attending: Oncology

## 2020-12-10 ENCOUNTER — Other Ambulatory Visit: Payer: Self-pay

## 2020-12-10 VITALS — BP 119/70 | HR 56 | Temp 98.2°F | Resp 16 | Ht 61.0 in | Wt 162.0 lb

## 2020-12-10 DIAGNOSIS — D509 Iron deficiency anemia, unspecified: Secondary | ICD-10-CM | POA: Diagnosis not present

## 2020-12-10 DIAGNOSIS — Z79899 Other long term (current) drug therapy: Secondary | ICD-10-CM | POA: Diagnosis not present

## 2020-12-10 DIAGNOSIS — D508 Other iron deficiency anemias: Secondary | ICD-10-CM

## 2020-12-10 MED ORDER — SODIUM CHLORIDE 0.9 % IV SOLN
Freq: Once | INTRAVENOUS | Status: AC
  Filled 2020-12-10: qty 250

## 2020-12-10 MED ORDER — SODIUM CHLORIDE 0.9 % IV SOLN
200.0000 mg | Freq: Once | INTRAVENOUS | Status: AC
  Administered 2020-12-10: 200 mg via INTRAVENOUS
  Filled 2020-12-10: qty 200

## 2020-12-10 NOTE — Addendum Note (Signed)
Addended by: Juanetta Beets on: 12/10/2020 05:03 PM   Modules accepted: Orders

## 2020-12-10 NOTE — Progress Notes (Signed)
Pt d/c stable at 1600

## 2020-12-10 NOTE — Patient Instructions (Signed)

## 2020-12-12 ENCOUNTER — Inpatient Hospital Stay: Payer: BC Managed Care – PPO

## 2020-12-12 ENCOUNTER — Other Ambulatory Visit: Payer: Self-pay

## 2020-12-12 VITALS — BP 113/40 | HR 66 | Temp 98.2°F | Resp 18 | Ht 61.0 in | Wt 164.0 lb

## 2020-12-12 DIAGNOSIS — D509 Iron deficiency anemia, unspecified: Secondary | ICD-10-CM | POA: Diagnosis not present

## 2020-12-12 DIAGNOSIS — D508 Other iron deficiency anemias: Secondary | ICD-10-CM

## 2020-12-12 MED ORDER — SODIUM CHLORIDE 0.9 % IV SOLN
Freq: Once | INTRAVENOUS | Status: AC
  Filled 2020-12-12: qty 250

## 2020-12-12 MED ORDER — IRON SUCROSE 20 MG/ML IV SOLN
200.0000 mg | Freq: Once | INTRAVENOUS | Status: AC
  Administered 2020-12-12: 200 mg via INTRAVENOUS
  Filled 2020-12-12: qty 200

## 2020-12-12 NOTE — Patient Instructions (Signed)

## 2020-12-12 NOTE — Progress Notes (Signed)
Pt d/c stable at 1533

## 2020-12-14 ENCOUNTER — Other Ambulatory Visit: Payer: Self-pay

## 2020-12-14 ENCOUNTER — Inpatient Hospital Stay: Payer: BC Managed Care – PPO

## 2020-12-14 VITALS — BP 115/52 | HR 64 | Temp 98.4°F | Resp 18 | Ht 61.0 in | Wt 166.0 lb

## 2020-12-14 DIAGNOSIS — D508 Other iron deficiency anemias: Secondary | ICD-10-CM

## 2020-12-14 DIAGNOSIS — D509 Iron deficiency anemia, unspecified: Secondary | ICD-10-CM | POA: Diagnosis not present

## 2020-12-14 MED ORDER — SODIUM CHLORIDE 0.9 % IV SOLN
200.0000 mg | Freq: Once | INTRAVENOUS | Status: AC
  Administered 2020-12-14: 200 mg via INTRAVENOUS
  Filled 2020-12-14: qty 200

## 2020-12-14 MED ORDER — SODIUM CHLORIDE 0.9 % IV SOLN
Freq: Once | INTRAVENOUS | Status: AC
  Filled 2020-12-14: qty 250

## 2020-12-14 NOTE — Progress Notes (Signed)
Pt d/c stable at 1600

## 2020-12-14 NOTE — Patient Instructions (Signed)

## 2020-12-18 ENCOUNTER — Inpatient Hospital Stay: Payer: BC Managed Care – PPO

## 2020-12-18 ENCOUNTER — Other Ambulatory Visit: Payer: Self-pay

## 2020-12-18 VITALS — BP 117/52 | HR 66 | Temp 98.1°F | Resp 18

## 2020-12-18 DIAGNOSIS — D509 Iron deficiency anemia, unspecified: Secondary | ICD-10-CM | POA: Diagnosis not present

## 2020-12-18 DIAGNOSIS — D508 Other iron deficiency anemias: Secondary | ICD-10-CM

## 2020-12-18 MED ORDER — SODIUM CHLORIDE 0.9 % IV SOLN
Freq: Once | INTRAVENOUS | Status: AC
  Filled 2020-12-18: qty 250

## 2020-12-18 MED ORDER — SODIUM CHLORIDE 0.9 % IV SOLN
200.0000 mg | Freq: Once | INTRAVENOUS | Status: AC
  Administered 2020-12-18: 200 mg via INTRAVENOUS
  Filled 2020-12-18: qty 200

## 2020-12-18 NOTE — Patient Instructions (Signed)

## 2020-12-20 ENCOUNTER — Other Ambulatory Visit: Payer: Self-pay

## 2020-12-20 ENCOUNTER — Inpatient Hospital Stay: Payer: BC Managed Care – PPO

## 2020-12-20 VITALS — BP 109/59 | HR 68 | Temp 98.2°F | Resp 18 | Ht 61.0 in | Wt 164.0 lb

## 2020-12-20 DIAGNOSIS — D509 Iron deficiency anemia, unspecified: Secondary | ICD-10-CM | POA: Diagnosis not present

## 2020-12-20 DIAGNOSIS — D508 Other iron deficiency anemias: Secondary | ICD-10-CM

## 2020-12-20 MED ORDER — SODIUM CHLORIDE 0.9 % IV SOLN
200.0000 mg | Freq: Once | INTRAVENOUS | Status: AC
  Administered 2020-12-20: 200 mg via INTRAVENOUS
  Filled 2020-12-20: qty 200

## 2020-12-20 MED ORDER — SODIUM CHLORIDE 0.9 % IV SOLN
Freq: Once | INTRAVENOUS | Status: AC
  Filled 2020-12-20: qty 250

## 2020-12-20 NOTE — Patient Instructions (Signed)

## 2020-12-20 NOTE — Progress Notes (Signed)
Pt d/c stable at 1600

## 2021-02-15 ENCOUNTER — Other Ambulatory Visit: Payer: Self-pay | Admitting: Obstetrics and Gynecology

## 2021-02-15 MED ORDER — LETROZOLE 2.5 MG PO TABS: 2.5000 mg | ORAL_TABLET | Freq: Every day | ORAL | 0 refills | Status: AC

## 2021-02-15 NOTE — Progress Notes (Signed)
Cycle II letrozole 2.5mg  02/14/2021

## 2021-02-19 ENCOUNTER — Other Ambulatory Visit: Payer: Self-pay | Admitting: Obstetrics and Gynecology

## 2021-02-26 ENCOUNTER — Ambulatory Visit: Payer: BC Managed Care – PPO | Admitting: Oncology

## 2021-02-26 ENCOUNTER — Other Ambulatory Visit: Payer: BC Managed Care – PPO

## 2021-03-05 NOTE — Progress Notes (Signed)
[facility]  [address] [place],  [place]  [address] [phone]  Clinic Day:  03/12/2021  Referring physician: Philmore Pali, NP  This document serves as a record of services personally performed by Marice Potter, MD. It was created on their behalf by Curry,Lauren E, a trained medical scribe. The creation of this record is based on the scribe's personal observations and the provider's statements to them.  HISTORY OF PRESENT ILLNESS:  The patient is a 30 y.o. female  with iron deficiency anemia.  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  She also had gastric bypass surgery a few years ago.   She comes in today to reassess her iron and hemoglobin levels to see how well she responded to her IV Venofer.  Since receiving her IV iron , she has felt tremendously.  She brings to our attention that she is now a few weeks pregnant.  PHYSICAL EXAM:  Blood pressure 118/67, pulse 61, temperature 98.4 F (36.9 C), resp. rate 14, height 5\' 1"  (1.549 m), weight 161 lb 8 oz (73.3 kg), SpO2 98 %. Wt Readings from Last 3 Encounters:  03/12/21 161 lb 8 oz (73.3 kg)  12/20/20 164 lb (74.4 kg)  12/14/20 166 lb 0.6 oz (75.3 kg)   Body mass index is 30.52 kg/m. Performance status (ECOG): 0 - Asymptomatic Physical Exam Constitutional:      Appearance: Normal appearance. She is not ill-appearing.  HENT:     Mouth/Throat:     Mouth: Mucous membranes are moist.     Pharynx: Oropharynx is clear. No oropharyngeal exudate or posterior oropharyngeal erythema.  Cardiovascular:     Rate and Rhythm: Normal rate and regular rhythm.     Heart sounds: No murmur heard.   No friction rub. No gallop.  Pulmonary:     Effort: Pulmonary effort is normal. No respiratory distress.     Breath sounds: Normal breath sounds. No wheezing, rhonchi or rales.  Chest:  Breasts:    Right: No axillary adenopathy or  supraclavicular adenopathy.     Left: No axillary adenopathy or supraclavicular adenopathy.  Abdominal:     General: Bowel sounds are normal. There is no distension.     Palpations: Abdomen is soft. There is no mass.     Tenderness: There is no abdominal tenderness.  Musculoskeletal:        General: No swelling.     Right lower leg: No edema.     Left lower leg: No edema.  Lymphadenopathy:     Cervical: No cervical adenopathy.     Upper Body:     Right upper body: No supraclavicular or axillary adenopathy.     Left upper body: No supraclavicular or axillary adenopathy.     Lower Body: No right inguinal adenopathy. No left inguinal adenopathy.  Skin:    General: Skin is warm.     Coloration: Skin is not jaundiced.     Findings: No lesion or rash.  Neurological:     General: No focal deficit present.     Mental Status: She is alert and oriented to person, place, and time. Mental status is at baseline.     Cranial Nerves: Cranial nerves are intact.  Psychiatric:        Mood and Affect: Mood normal.        Behavior: Behavior normal.        Thought Content: Thought content normal.  LABS:     Ref. Range 11/26/2020 10:46 03/12/2021 00:00 03/12/2021 13:58  Iron Latest Ref Range: 28 - 170 ug/dL 20 (L)  145  UIBC Latest Units: ug/dL 475  263  TIBC Latest Ref Range: 250 - 450 ug/dL 495 (H)  408  Saturation Ratios Latest Ref Range: 10.4 - 31.8 % 4 (L)  36 (H)  Ferritin Latest Ref Range: 11 - 307 ng/mL 4 (L)  26     ASSESSMENT & PLAN:  A 30 y.o. female with iron deficiency anemia, likely related to her previous gastric bypass surgery.  I am very pleased to see how well her iron and hemoglobin levels responded to her IV Venofer.  Clinically, she is doing very well.  As that is the case, I will see her back in 6 months for repeat clinical assessment.  The patient understands all the plans discussed today and is in agreement with them.   I, Rita Ohara, am acting as scribe for Marice Potter, MD    I have reviewed this report as typed by the medical scribe, and it is complete and accurate.  Dequincy Macarthur Critchley, MD

## 2021-03-12 ENCOUNTER — Other Ambulatory Visit: Payer: Self-pay | Admitting: Oncology

## 2021-03-12 ENCOUNTER — Inpatient Hospital Stay (INDEPENDENT_AMBULATORY_CARE_PROVIDER_SITE_OTHER): Payer: BC Managed Care – PPO | Admitting: Oncology

## 2021-03-12 ENCOUNTER — Inpatient Hospital Stay: Payer: BC Managed Care – PPO | Attending: Oncology

## 2021-03-12 ENCOUNTER — Other Ambulatory Visit: Payer: Self-pay

## 2021-03-12 ENCOUNTER — Telehealth: Payer: Self-pay

## 2021-03-12 ENCOUNTER — Other Ambulatory Visit: Payer: Self-pay | Admitting: Hematology and Oncology

## 2021-03-12 VITALS — BP 118/67 | HR 61 | Temp 98.4°F | Resp 14 | Ht 61.0 in | Wt 161.5 lb

## 2021-03-12 DIAGNOSIS — D508 Other iron deficiency anemias: Secondary | ICD-10-CM | POA: Diagnosis not present

## 2021-03-12 DIAGNOSIS — D509 Iron deficiency anemia, unspecified: Secondary | ICD-10-CM | POA: Insufficient documentation

## 2021-03-12 LAB — FERRITIN: Ferritin: 26 ng/mL (ref 11–307)

## 2021-03-12 LAB — IRON AND TIBC
Iron: 145 ug/dL (ref 28–170)
Saturation Ratios: 36 % — ABNORMAL HIGH (ref 10.4–31.8)
TIBC: 408 ug/dL (ref 250–450)
UIBC: 263 ug/dL

## 2021-03-12 LAB — CBC AND DIFFERENTIAL
HCT: 41 (ref 36–46)
Hemoglobin: 13.7 (ref 12.0–16.0)
Neutrophils Absolute: 3.65
Platelets: 279 (ref 150–399)
WBC: 5.7

## 2021-03-12 LAB — CBC: RBC: 5.01 (ref 3.87–5.11)

## 2021-03-12 NOTE — Telephone Encounter (Signed)
Called and spoke with patient. She is scheduled with AMS 04/08/21.

## 2021-03-12 NOTE — Telephone Encounter (Signed)
Sch NOB appt next week w AMS. Encourage PNV use and healthy first trimester practices.

## 2021-03-12 NOTE — Telephone Encounter (Signed)
[facility] referring for routine physical exam. Voicemail is full unable to leave message.

## 2021-03-18 ENCOUNTER — Other Ambulatory Visit: Payer: Self-pay | Admitting: Obstetrics and Gynecology

## 2021-03-27 ENCOUNTER — Other Ambulatory Visit: Payer: Self-pay

## 2021-03-27 ENCOUNTER — Other Ambulatory Visit (HOSPITAL_COMMUNITY)
Admission: RE | Admit: 2021-03-27 | Discharge: 2021-03-27 | Disposition: A | Discharge status: Home / Self Care | Payer: BC Managed Care – PPO | Source: Ambulatory Visit | Attending: Obstetrics and Gynecology | Admitting: Obstetrics and Gynecology

## 2021-03-27 ENCOUNTER — Ambulatory Visit (INDEPENDENT_AMBULATORY_CARE_PROVIDER_SITE_OTHER): Payer: BC Managed Care – PPO | Admitting: Obstetrics and Gynecology

## 2021-03-27 ENCOUNTER — Encounter: Payer: Self-pay | Admitting: Obstetrics and Gynecology

## 2021-03-27 VITALS — BP 119/77 | Ht 61.0 in | Wt 160.0 lb

## 2021-03-27 DIAGNOSIS — Z3A01 Less than 8 weeks gestation of pregnancy: Secondary | ICD-10-CM

## 2021-03-27 DIAGNOSIS — O99211 Obesity complicating pregnancy, first trimester: Secondary | ICD-10-CM | POA: Diagnosis not present

## 2021-03-27 DIAGNOSIS — O99840 Bariatric surgery status complicating pregnancy, unspecified trimester: Secondary | ICD-10-CM | POA: Insufficient documentation

## 2021-03-27 DIAGNOSIS — N926 Irregular menstruation, unspecified: Secondary | ICD-10-CM | POA: Diagnosis not present

## 2021-03-27 DIAGNOSIS — O99210 Obesity complicating pregnancy, unspecified trimester: Secondary | ICD-10-CM | POA: Insufficient documentation

## 2021-03-27 DIAGNOSIS — O0991 Supervision of high risk pregnancy, unspecified, first trimester: Secondary | ICD-10-CM | POA: Insufficient documentation

## 2021-03-27 DIAGNOSIS — Z369 Encounter for antenatal screening, unspecified: Secondary | ICD-10-CM

## 2021-03-27 DIAGNOSIS — O0990 Supervision of high risk pregnancy, unspecified, unspecified trimester: Secondary | ICD-10-CM | POA: Insufficient documentation

## 2021-03-27 LAB — POCT URINALYSIS DIPSTICK OB: Glucose, UA: NEGATIVE

## 2021-03-27 LAB — POCT URINE PREGNANCY: Preg Test, Ur: POSITIVE — AB

## 2021-03-27 NOTE — Progress Notes (Signed)
New Obstetric Patient H&P   Chief Complaint: "Desires prenatal care"  History of Present Illness: Patient is a 30 y.o. G1P0000 Not Hispanic or [place] female, LMP 02/13/2021 presents with amenorrhea and positive home pregnancy test. Based on her  LMP, her EDD is Estimated Date of Delivery: 11/20/21 and her EGA is [redacted]w[redacted]d. Her last pap smear was 07/2018 years ago and was no abnormalities.    She had a urine pregnancy test which was positive 2 or 3 week(s)  ago. She was taking letrozole 2.5 mg to achieve ovulation. Since her LMP she claims she has experienced no issues. She denies vaginal bleeding. Her past medical history is notable for gastric bypass surgery and iron deficiency. First pregnancy.   Since her LMP, she admits to the use of tobacco products  no She claims she has gained  zero pounds since the start of her pregnancy.  There are cats in the home in the home  yes If yes Indoor, Outdoor (she has been wearing an N95 mask and washing her hands after changing the litter). She admits close contact with children on a regular basis  yes  She has had chicken pox in the past yes She has had Tuberculosis exposures, symptoms, or previously tested positive for TB   no Current or past history of domestic violence. no  Genetic Screening/Teratology Counseling: (Includes patient, baby's father, or anyone in either family with:)   60. Patient's age >/= 71 at [facility]  no 2. Thalassemia ([place], [place], [place], or Asian background): MCV<80  no 3. Neural tube defect (meningomyelocele, spina bifida, anencephaly)  no 4. Congenital heart defect  no  5. Down syndrome  no 6. Tay-Sachs (Jewish, [place])  no 7. Canavan's Disease  no 8. Sickle cell disease or trait (African)  no  9. Hemophilia or other blood disorders  no  10. Muscular dystrophy  no  11. Cystic fibrosis  no  12. Huntington's Chorea  no  13. Mental retardation/autism  no 14. Other inherited genetic or chromosomal disorder  no.  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 Maternal metabolic disorder (DM, PKU, etc)  no 16. Patient or FOB with a child with a birth defect not listed above no  16a. Patient or FOB with a birth defect themselves FOB with what sounds like craniosynostosis 17. Recurrent pregnancy loss, or stillbirth  no  18. Any medications since LMP other than prenatal vitamins (include vitamins, supplements, OTC meds, drugs, alcohol)  PNV 19. Any other genetic/environmental exposure to discuss  no   Infection History:   1. Lives with someone with TB or TB exposed  no  2. Patient or partner has history of genital herpes  no 3. Rash or viral illness since LMP  no 4. History of STI (GC, CT, HPV, syphilis, HIV)  no 5. History of recent travel :  no  Other pertinent information:  s/p Roux-en-y gastric bypass in 2018. She has lost 80 pounds since the surgery.  She also achieved this pregnancy using letrozole, as she has a history of PCOS.    Review of Systems:10 point review of systems negative unless otherwise noted in HPI  Past Medical History:  Diagnosis Date   Anemia    Anxiety    Depression    Uterine adenomyoma    JUVENILE CYSTIC    Past Surgical History:  Procedure Laterality Date   GASTRIC ROUX-EN-Y     LAPAROSCOPIC GASTRIC BYPASS  02/24/2017   WISDOM  TOOTH EXTRACTION      Gynecologic History: Patient's last menstrual period was 02/13/2021.  Obstetric History: G1P0000  Family History  Problem Relation Age of Onset   Cervical cancer Mother 65       total hysterectomy    Social History   Socioeconomic History   Marital status: Married    Spouse name: FRANCOIS   Number of children: 0   Years of education: 12 + 4   Highest education level: Not on file  Occupational History   Occupation: THIRD GRADE SCHOOL TEACHER  Tobacco Use   Smoking status: Never   Smokeless tobacco: Never  Vaping Use   Vaping Use: Never used   Substance and Sexual Activity   Alcohol use: No   Drug use: No   Sexual activity: Yes    Partners: Male    Birth control/protection: Pill  Other Topics Concern   Not on file  Social History Narrative   Not on file   Social Determinants of Health   Financial Resource Strain: Not on file  Food Insecurity: Not on file  Transportation Needs: Not on file  Physical Activity: Not on file  Stress: Not on file  Social Connections: Not on file  Intimate Partner Violence: Not on file   Allergies: No Known Allergies  Prior to Admission medications: PNV    Physical Exam BP 119/77   Ht 5\' 1"  (1.549 m)   Wt 160 lb (72.6 kg)   LMP 02/13/2021   BMI 30.23 kg/m   Physical Exam Constitutional:      General: She is not in acute distress.    Appearance: Normal appearance. She is well-developed.  Genitourinary:     Vulva, bladder and urethral meatus normal.     Right Labia: No rash, tenderness, lesions, skin changes or Bartholin's cyst.    Left Labia: No tenderness, skin changes, Bartholin's cyst or rash.    No inguinal adenopathy present in the right or left side.    Pelvic Tanner Score: 5/5.     Right Adnexa: not tender, not full and no mass present.    Left Adnexa: not tender, not full and no mass present.    No cervical motion tenderness, friability, lesion or polyp.     Uterus is not enlarged, fixed or tender.     Uterus is anteverted.     No urethral tenderness or mass present.     Pelvic exam was performed with patient in the lithotomy position.  HENT:     Head: Normocephalic and atraumatic.  Eyes:     General: No scleral icterus.    Conjunctiva/sclera: Conjunctivae normal.  Cardiovascular:     Rate and Rhythm: Normal rate and regular rhythm.     Heart sounds: No murmur heard.   No friction rub. No gallop.  Pulmonary:     Effort: Pulmonary effort is normal. No respiratory distress.     Breath sounds: Normal breath sounds. No wheezing or rales.  Abdominal:      General: Bowel sounds are normal. There is no distension.     Palpations: Abdomen is soft. There is no mass.     Tenderness: There is no abdominal tenderness. There is no guarding or rebound.     Hernia: There is no hernia in the left inguinal area or right inguinal area.  Musculoskeletal:        General: Normal range of motion.     Cervical back: Normal range of motion and neck supple.  Lymphadenopathy:  Lower Body: No right inguinal adenopathy. No left inguinal adenopathy.  Neurological:     General: No focal deficit present.     Mental Status: She is alert and oriented to person, place, and time.     Cranial Nerves: No cranial nerve deficit.  Skin:    General: Skin is warm and dry.     Findings: No erythema.  Psychiatric:        Mood and Affect: Mood normal.        Behavior: Behavior normal.        Judgment: Judgment normal.      Female Chaperone present during breast and/or pelvic exam.   Assessment: 30 y.o. G1P0000 at [redacted]w[redacted]d presenting to initiate prenatal care  Plan: 1) Avoid alcoholic beverages. 2) Patient encouraged not to smoke.  3) Discontinue the use of all non-medicinal drugs and chemicals.  4) Take prenatal vitamins daily.  5) Nutrition, food safety (fish, cheese advisories, and high nitrite foods) and exercise discussed. 6) Hospital and practice style discussed with cross coverage system.  7) Genetic Screening, such as with 1st Trimester Screening, cell free fetal DNA, AFP testing, and Ultrasound, as well as with amniocentesis and CVS as appropriate, is discussed with patient. At the conclusion of today's visit patient requested genetic testing 8) Patient is asked about travel to areas at risk for the Zika virus, and counseled to avoid travel and exposure to mosquitoes or sexual partners who may have themselves been exposed to the virus. Testing is discussed, and will be ordered as appropriate.  9) Nutrition consult for roux-en-y history.   10) ultrasound with  dating by MD soon.    Prentice Docker, MD 03/27/2021 10:53 AM

## 2021-03-29 LAB — CYTOLOGY - PAP
Chlamydia: NEGATIVE
Comment: NEGATIVE
Comment: NEGATIVE
Comment: NORMAL
Diagnosis: NEGATIVE
High risk HPV: NEGATIVE
Neisseria Gonorrhea: NEGATIVE

## 2021-03-29 LAB — HGB FRACTIONATION CASCADE
Hgb A2: 2.4 % (ref 1.8–3.2)
Hgb A: 97.6 % (ref 96.4–98.8)
Hgb F: 0 % (ref 0.0–2.0)
Hgb S: 0 %

## 2021-03-29 LAB — RPR+RH+ABO+RUB AB+AB SCR+CB...
Antibody Screen: NEGATIVE
HIV Screen 4th Generation wRfx: NONREACTIVE
Hematocrit: 42.2 % (ref 34.0–46.6)
Hemoglobin: 13.7 g/dL (ref 11.1–15.9)
Hepatitis B Surface Ag: NEGATIVE
MCH: 27 pg (ref 26.6–33.0)
MCHC: 32.5 g/dL (ref 31.5–35.7)
MCV: 83 fL (ref 79–97)
Platelets: 320 10*3/uL (ref 150–450)
RBC: 5.07 x10E6/uL (ref 3.77–5.28)
RDW: 12.6 % (ref 11.7–15.4)
RPR Ser Ql: NONREACTIVE
Rh Factor: POSITIVE
Rubella Antibodies, IGG: 1.87 index (ref >0.99)
Varicella zoster IgG: 1124 index (ref >165)
WBC: 5.8 10*3/uL (ref 3.4–10.8)

## 2021-03-29 LAB — URINE CULTURE

## 2021-03-29 NOTE — Progress Notes (Signed)
Was sent to my inbox

## 2021-04-08 ENCOUNTER — Encounter: Payer: BC Managed Care – PPO | Admitting: Obstetrics and Gynecology

## 2021-04-10 ENCOUNTER — Ambulatory Visit (INDEPENDENT_AMBULATORY_CARE_PROVIDER_SITE_OTHER): Payer: BC Managed Care – PPO | Admitting: Obstetrics & Gynecology

## 2021-04-10 ENCOUNTER — Encounter: Payer: Self-pay | Admitting: Obstetrics & Gynecology

## 2021-04-10 ENCOUNTER — Other Ambulatory Visit: Payer: Self-pay

## 2021-04-10 VITALS — BP 100/70 | Wt 161.0 lb

## 2021-04-10 DIAGNOSIS — N926 Irregular menstruation, unspecified: Secondary | ICD-10-CM | POA: Diagnosis not present

## 2021-04-10 DIAGNOSIS — Z3A08 8 weeks gestation of pregnancy: Secondary | ICD-10-CM

## 2021-04-10 DIAGNOSIS — O0991 Supervision of high risk pregnancy, unspecified, first trimester: Secondary | ICD-10-CM

## 2021-04-10 DIAGNOSIS — O99840 Bariatric surgery status complicating pregnancy, unspecified trimester: Secondary | ICD-10-CM

## 2021-04-10 NOTE — Patient Instructions (Signed)

## 2021-04-10 NOTE — Progress Notes (Signed)
ULTRASOUND REPORT  Location: [facility] Date of Service: 04/10/2021   Indications:dating Findings:  Wendy Johnston intrauterine pregnancy is visualized with a CRL consistent with 7 w6d gestation, giving an (U/S) EDD of 11/21/21. The (U/S) EDD is consistent with the clinically established EDD of 11/20/21.  FHR: 150 BPM CRL measurement: 14.5 mm Yolk sac is visualized and appears normal. 4.53m Amnion: visualized and appears normal   Right Ovary is normal in appearance. Left Ovary is normal appearance. Corpus luteal cyst:  is not visualized Survey of the adnexa demonstrates no adnexal masses. There is no free peritoneal fluid in the cul de sac.  Impression: 1. 726w6diable [place] Intrauterine pregnancy by U/S. 2. (U/S) EDD is consistent with Clinically established EDD of 11/20/21.  Recommendations: 1.Clinical correlation with the patient's History and Physical Exam. 2. Keep EDC  RoHoyt KochMD

## 2021-04-10 NOTE — Progress Notes (Signed)
  Subjective  Min nausea.  Breast T No VB or pain  Objective  BP 100/70   Wt 161 lb (73 kg)   LMP 02/13/2021   BMI 30.42 kg/m  General: NAD Pumonary: no increased work of breathing Abdomen: gravid, non-tender Extremities: no edema Psychiatric: mood appropriate, affect full  Assessment  30 y.o. G1P0000 at 90w0dby  11/20/2021, by Last Menstrual Period presenting for routine prenatal visit  Plan   Problem List Items Addressed This Visit      Other   Previous gastric bypass affecting pregnancy, antepartum  Other Visit Diagnoses    Supervision of high risk pregnancy in first trimester    -  Primary   Missed period       [redacted] weeks gestation of pregnancy        PNV NIPT and Vit B12,D and CBC nv See [place] today  PBarnett Applebaum MD, [facility], [facility] 04/10/2021  2:27 PM

## 2021-04-26 ENCOUNTER — Ambulatory Visit (INDEPENDENT_AMBULATORY_CARE_PROVIDER_SITE_OTHER): Payer: BC Managed Care – PPO | Admitting: Obstetrics

## 2021-04-26 ENCOUNTER — Other Ambulatory Visit: Payer: Self-pay

## 2021-04-26 VITALS — BP 118/74 | Wt 158.0 lb

## 2021-04-26 DIAGNOSIS — O99840 Bariatric surgery status complicating pregnancy, unspecified trimester: Secondary | ICD-10-CM

## 2021-04-26 DIAGNOSIS — O0990 Supervision of high risk pregnancy, unspecified, unspecified trimester: Secondary | ICD-10-CM

## 2021-04-26 DIAGNOSIS — Z3A10 10 weeks gestation of pregnancy: Secondary | ICD-10-CM

## 2021-04-26 DIAGNOSIS — Z1379 Encounter for other screening for genetic and chromosomal anomalies: Secondary | ICD-10-CM

## 2021-04-26 DIAGNOSIS — O0991 Supervision of high risk pregnancy, unspecified, first trimester: Secondary | ICD-10-CM

## 2021-04-26 NOTE — Progress Notes (Signed)
  Routine Prenatal Care Visit  Subjective  Wendy Johnston is a 30 y.o. G1P0000 at 27w2dbeing seen today for ongoing prenatal care.  She is currently monitored for the following issues for this high-risk pregnancy and has Iron deficiency anemia; Supervision of high risk pregnancy, antepartum; Previous gastric bypass affecting pregnancy, antepartum; and Obesity affecting pregnancy on their problem list.  ----------------------------------------------------------------------------------- Patient reports no complaints.    . Vag. Bleeding: None.   . Leaking Fluid denies.  ----------------------------------------------------------------------------------- The following portions of the patient's history were reviewed and updated as appropriate: allergies, current medications, past family history, past medical history, past social history, past surgical history and problem list. Problem list updated.  Objective  Blood pressure 118/74, weight 158 lb (71.7 kg), last menstrual period 02/13/2021. Pregravid weight 160 lb (72.6 kg) Total Weight Gain -2 lb (-0.907 kg) Urinalysis: Urine Protein    Urine Glucose    Fetal Status:           General:  Alert, oriented and cooperative. Patient is in no acute distress.  Skin: Skin is warm and dry. No rash noted.   Cardiovascular: Normal heart rate noted  Respiratory: Normal respiratory effort, no problems with respiration noted  Abdomen: Soft, gravid, appropriate for gestational age. Pain/Pressure: Absent     Pelvic:  Cervical exam deferred        Extremities: Normal range of motion.     Mental Status: Normal mood and affect. Normal behavior. Normal judgment and thought content.   Assessment   30y.o. G1P0000 at 137w2dy  11/20/2021, by Last Menstrual Period presenting for routine prenatal visit  Plan   pregnancy 1 Problems (from 03/27/21 to present)    Problem Noted Resolved   Supervision of high risk pregnancy, antepartum 03/27/2021 by JaWill BonnetMD No   Overview Signed 04/26/2021 10:31 AM by FrImagene RichesCNM     Nursing Staff Provider  Office Location  [facility] Dating    Language  English Anatomy [place]  Flu Vaccine   Genetic Screen  NIPS:   TDaP vaccine    Hgb A1C or  GTT Early : Third trimester :   Covid    LAB RESULTS   Rhogam   Blood Type A/Positive/-- (07/20 1150)   Feeding Plan  Antibody Negative (07/20 1150)  Contraception  Rubella 1.87 (07/20 1150)  Circumcision  RPR Non Reactive (07/20 1150)   Pediatrician   HBsAg Negative (07/20 1150)   Support Person  HIV Non Reactive (07/20 1150)  Prenatal Classes  Varicella     GBS  (For PCN allergy, check sensitivities)   BTL Consent     VBAC Consent  Pap      Hgb Electro      CF      SMA               Previous gastric bypass affecting pregnancy, antepartum 03/27/2021 by JaWill BonnetMD No   Obesity affecting pregnancy 03/27/2021 by JaWill BonnetMD No       Preterm labor symptoms and general obstetric precautions including but not limited to vaginal bleeding, contractions, leaking of fluid and fetal movement were reviewed in detail with the patient. Please refer to After Visit Summary for other counseling recommendations.  She did not know that she had a nutrition appt.  Return in about 4 weeks (around 05/24/2021) for return OB.  MaImagene RichesCNM  04/26/2021 10:32 AM

## 2021-04-26 NOTE — Progress Notes (Signed)
No vb. No lof.  

## 2021-05-04 LAB — MATERNIT 21 PLUS CORE, BLOOD
Fetal Fraction: 10
Result (T21): NEGATIVE
Trisomy 13 (Patau syndrome): NEGATIVE
Trisomy 18 (Edwards syndrome): NEGATIVE
Trisomy 21 (Down syndrome): NEGATIVE

## 2021-05-06 LAB — INHERITEST CORE(CF97,SMA,FRAX)

## 2021-05-23 ENCOUNTER — Ambulatory Visit (INDEPENDENT_AMBULATORY_CARE_PROVIDER_SITE_OTHER): Payer: BC Managed Care – PPO | Admitting: Obstetrics and Gynecology

## 2021-05-23 ENCOUNTER — Other Ambulatory Visit: Payer: Self-pay

## 2021-05-23 ENCOUNTER — Encounter: Payer: Self-pay | Admitting: Obstetrics and Gynecology

## 2021-05-23 VITALS — BP 110/70 | Ht 61.0 in | Wt 163.4 lb

## 2021-05-23 DIAGNOSIS — Z3A14 14 weeks gestation of pregnancy: Secondary | ICD-10-CM

## 2021-05-23 DIAGNOSIS — O0990 Supervision of high risk pregnancy, unspecified, unspecified trimester: Secondary | ICD-10-CM

## 2021-05-23 DIAGNOSIS — O0992 Supervision of high risk pregnancy, unspecified, second trimester: Secondary | ICD-10-CM

## 2021-05-23 DIAGNOSIS — O99840 Bariatric surgery status complicating pregnancy, unspecified trimester: Secondary | ICD-10-CM

## 2021-05-23 DIAGNOSIS — O0991 Supervision of high risk pregnancy, unspecified, first trimester: Secondary | ICD-10-CM

## 2021-05-23 LAB — POCT URINALYSIS DIPSTICK OB
Glucose, UA: NEGATIVE
POC,PROTEIN,UA: NEGATIVE

## 2021-05-23 NOTE — Progress Notes (Signed)
Routine Prenatal Care Visit  Subjective  Wendy Johnston is a 30 y.o. G1P0000 at 30w1dbeing seen today for ongoing prenatal care.  She is currently monitored for the following issues for this low-risk pregnancy and has Iron deficiency anemia; Supervision of high risk pregnancy, antepartum; Previous gastric bypass affecting pregnancy, antepartum; and Obesity affecting pregnancy on their problem list.  ----------------------------------------------------------------------------------- Patient reports no complaints.   Contractions: Not present. Vag. Bleeding: None.  Movement: Absent. Denies leaking of fluid.  ----------------------------------------------------------------------------------- The following portions of the patient's history were reviewed and updated as appropriate: allergies, current medications, past family history, past medical history, past social history, past surgical history and problem list. Problem list updated.   Objective  Blood pressure 110/70, height '5\' 1"'$  (1.549 m), weight 163 lb 6.4 oz (74.1 kg), last menstrual period 02/13/2021. Pregravid weight 160 lb (72.6 kg) Total Weight Gain 3 lb 6.4 oz (1.542 kg) Urinalysis:      Fetal Status: Fetal Heart Rate (bpm): 150   Movement: Absent     General:  Alert, oriented and cooperative. Patient is in no acute distress.  Skin: Skin is warm and dry. No rash noted.   Cardiovascular: Normal heart rate noted  Respiratory: Normal respiratory effort, no problems with respiration noted  Abdomen: Soft, gravid, appropriate for gestational age. Pain/Pressure: Absent     Pelvic:  Cervical exam deferred        Extremities: Normal range of motion.  Edema: None  Mental Status: Normal mood and affect. Normal behavior. Normal judgment and thought content.     Assessment   30y.o. G1P0000 at 152w1dy  11/20/2021, by Last Menstrual Period presenting for routine prenatal visit  Plan   pregnancy 1 Problems (from 03/27/21 to  present)     Problem Noted Resolved   Supervision of high risk pregnancy, antepartum 03/27/2021 by JaWill BonnetMD No   Overview Addendum 05/07/2021 12:56 PM by FrImagene RichesCNM     Nursing Staff Provider  Office Location  [facility] Dating    Language  English Anatomy [place]  Flu Vaccine   Genetic Screen  NIPS: maternT negative- xy. Inheritest neg  TDaP vaccine    Hgb A1C or  GTT Early : Third trimester :   Covid    LAB RESULTS   Rhogam   Blood Type A/Positive/-- (07/20 1150)   Feeding Plan  Antibody Negative (07/20 1150)  Contraception  Rubella 1.87 (07/20 1150)  Circumcision  RPR Non Reactive (07/20 1150)   Pediatrician   HBsAg Negative (07/20 1150)   Support Person  HIV Non Reactive (07/20 1150)  Prenatal Classes  Varicella     GBS  (For PCN allergy, check sensitivities)   BTL Consent     VBAC Consent  Pap      Hgb Electro      CF      SMA              Previous gastric bypass affecting pregnancy, antepartum 03/27/2021 by JaWill BonnetMD No   Obesity affecting pregnancy 03/27/2021 by JaWill BonnetMD No       Vitamin labs ordered, after 4:30 today and lab closed.  Provided with nutritional information.  Patient taking bariatric vitamin. Discussed travel during pregnancy Discussed prenatal classes Anatomy [place]   Gestational age appropriate obstetric precautions including but not limited to vaginal bleeding, contractions, leaking of fluid and fetal movement were reviewed in detail with the patient.  Return in about 4 weeks (around 06/20/2021) for [place] after anatomy US.  Homero Fellers MD [facility], [facility] 05/23/2021, 5:28 PM

## 2021-05-23 NOTE — Patient Instructions (Addendum)
Extra labs to order and check vitamin levels during pregnancy:     Second Trimester of Pregnancy The second trimester of pregnancy is from week 13 through week 27. This is months 4 through 6 of pregnancy. The second trimester is often a time when you feel your best. Your body has adjusted to being pregnant, and you begin to feel better physically. During the second trimester: Morning sickness has lessened or stopped completely. You may have more energy. You may have an increase in appetite. The second trimester is also a time when the unborn baby (fetus) is growing rapidly. At the end of the sixth month, the fetus may be up to 12 inches long and weigh about 1 pounds. You will likely begin to feel the baby move (quickening) between 16 and 20 weeks of pregnancy. Body changes during your second trimester Your body continues to go through many changes during your second trimester. The changes vary and generally return to normal after the baby is born. Physical changes Your weight will continue to increase. You will notice your lower abdomen bulging out. You may begin to get stretch marks on your hips, abdomen, and breasts. Your breasts will continue to grow and to become tender. Dark spots or blotches (chloasma or mask of pregnancy) may develop on your face. A dark line from your belly button to the pubic area (linea nigra) may appear. You may have changes in your hair. These can include thickening of your hair, rapid growth, and changes in texture. Some people also have hair loss during or after pregnancy, or hair that feels dry or thin. Health changes You may develop headaches. You may have heartburn. You may develop constipation. You may develop hemorrhoids or swollen, bulging veins (varicose veins). Your gums may bleed and may be sensitive to brushing and flossing. You may urinate more often because the fetus is pressing on your bladder. You may have back pain. This is caused  by: Weight gain. Pregnancy hormones that are relaxing the joints in your pelvis. A shift in weight and the muscles that support your balance. Follow these instructions at home: Medicines Follow your health care provider's instructions regarding medicine use. Specific medicines may be either safe or unsafe to take during pregnancy. Do not take any medicines unless approved by your health care provider. Take a prenatal vitamin that contains at least 600 micrograms (mcg) of folic acid. Eating and drinking Eat a healthy diet that includes fresh fruits and vegetables, whole grains, good sources of protein such as meat, eggs, or tofu, and low-fat dairy products. Avoid raw meat and unpasteurized juice, milk, and cheese. These carry germs that can harm you and your baby. You may need to take these actions to prevent or treat constipation: Drink enough fluid to keep your urine pale yellow. Eat foods that are high in fiber, such as beans, whole grains, and fresh fruits and vegetables. Limit foods that are high in fat and processed sugars, such as fried or sweet foods. Activity Exercise only as directed by your health care provider. Most people can continue their usual exercise routine during pregnancy. Try to exercise for 30 minutes at least 5 days a week. Stop exercising if you develop contractions in your uterus. Stop exercising if you develop pain or cramping in the lower abdomen or lower back. Avoid exercising if it is very hot or humid or if you are at a high altitude. Avoid heavy lifting. If you choose to, you may have sex unless  your health care provider tells you not to. Relieving pain and discomfort Wear a supportive bra to prevent discomfort from breast tenderness. Take warm sitz baths to soothe any pain or discomfort caused by hemorrhoids. Use hemorrhoid cream if your health care provider approves. Rest with your legs raised (elevated) if you have leg cramps or low back pain. If you develop  varicose veins: Wear support hose as told by your health care provider. Elevate your feet for 15 minutes, 3-4 times a day. Limit salt in your diet. Safety Wear your seat belt at all times when driving or riding in a car. Talk with your health care provider if someone is verbally or physically abusive to you. Lifestyle Do not use hot tubs, steam rooms, or saunas. Do not douche. Do not use tampons or scented sanitary pads. Avoid cat litter boxes and soil used by cats. These carry germs that can cause birth defects in the baby and possibly loss of the fetus by miscarriage or stillbirth. Do not use herbal remedies, alcohol, illegal drugs, or medicines that are not approved by your health care provider. Chemicals in these products can harm your baby. Do not use any products that contain nicotine or tobacco, such as cigarettes, e-cigarettes, and chewing tobacco. If you need help quitting, ask your health care provider. General instructions During a routine prenatal visit, your health care provider will do a physical exam and other tests. He or she will also discuss your overall health. Keep all follow-up visits. This is important. Ask your health care provider for a referral to a local prenatal education class. Ask for help if you have counseling or nutritional needs during pregnancy. Your health care provider can offer advice or refer you to specialists for help with various needs. Where to find more information American Pregnancy Association: americanpregnancy.[place] and Gynecologists: PoolDevices.com.pt Office on [employer] Health: KeywordPortfolios.com.br Contact a health care provider if you have: A headache that does not go away when you take medicine. Vision changes or you see spots in front of your eyes. Mild pelvic cramps, pelvic pressure, or nagging pain in the abdominal area. Persistent nausea, vomiting, or diarrhea. A bad-smelling  vaginal discharge or foul-smelling urine. Pain when you urinate. Sudden or extreme swelling of your face, hands, ankles, feet, or legs. A fever. Get help right away if you: Have fluid leaking from your vagina. Have spotting or bleeding from your vagina. Have severe abdominal cramping or pain. Have difficulty breathing. Have chest pain. Have fainting spells. Have not felt your baby move for the time period told by your health care provider. Have new or increased pain, swelling, or redness in an arm or leg. Summary The second trimester of pregnancy is from week 13 through week 27 (months 4 through 6). Do not use herbal remedies, alcohol, illegal drugs, or medicines that are not approved by your health care provider. Chemicals in these products can harm your baby. Exercise only as directed by your health care provider. Most people can continue their usual exercise routine during pregnancy. Keep all follow-up visits. This is important. This information is not intended to replace advice given to you by your health care provider. Make sure you discuss any questions you have with your health care provider. Document Revised: 02/01/2020 Document Reviewed: 12/08/2019 Elsevier Patient Education  2022 [employer].

## 2021-05-24 ENCOUNTER — Other Ambulatory Visit: Payer: BC Managed Care – PPO

## 2021-05-24 DIAGNOSIS — O99840 Bariatric surgery status complicating pregnancy, unspecified trimester: Secondary | ICD-10-CM

## 2021-05-26 LAB — IRON AND TIBC
Iron Saturation: 10 % — ABNORMAL LOW (ref 15–55)
Iron: 34 ug/dL (ref 27–159)
Total Iron Binding Capacity: 344 ug/dL (ref 250–450)
UIBC: 310 ug/dL (ref 131–425)

## 2021-05-26 LAB — VITAMIN B1

## 2021-05-26 LAB — FERRITIN: Ferritin: 17 ng/mL (ref 15–150)

## 2021-05-26 LAB — VITAMIN D 25 HYDROXY (VIT D DEFICIENCY, FRACTURES): Vit D, 25-Hydroxy: 38.5 ng/mL (ref 30.0–100.0)

## 2021-05-26 LAB — VITAMIN B12: Vitamin B-12: 163 pg/mL — ABNORMAL LOW (ref 232–1245)

## 2021-05-26 LAB — CALCIUM: Calcium: 8.4 mg/dL — ABNORMAL LOW (ref 8.7–10.2)

## 2021-05-26 LAB — FOLATE: Folate: 10.2 ng/mL

## 2021-06-01 ENCOUNTER — Encounter: Payer: Self-pay | Admitting: Oncology

## 2021-06-13 ENCOUNTER — Other Ambulatory Visit: Payer: Self-pay | Admitting: Obstetrics and Gynecology

## 2021-06-13 ENCOUNTER — Telehealth: Payer: Self-pay

## 2021-06-13 DIAGNOSIS — O0991 Supervision of high risk pregnancy, unspecified, first trimester: Secondary | ICD-10-CM

## 2021-06-13 NOTE — Telephone Encounter (Signed)
mar/sw patient to schedule referral received from [email or web address].

## 2021-06-13 NOTE — Telephone Encounter (Signed)
Contacted patient via phone. Left voicemail for patient to call back to confirm. My chart message sent

## 2021-06-20 ENCOUNTER — Other Ambulatory Visit: Payer: Self-pay

## 2021-06-20 ENCOUNTER — Ambulatory Visit (INDEPENDENT_AMBULATORY_CARE_PROVIDER_SITE_OTHER): Payer: BC Managed Care – PPO | Admitting: Obstetrics

## 2021-06-20 VITALS — BP 116/84 | Wt 166.0 lb

## 2021-06-20 DIAGNOSIS — Z3A18 18 weeks gestation of pregnancy: Secondary | ICD-10-CM

## 2021-06-20 DIAGNOSIS — O0990 Supervision of high risk pregnancy, unspecified, unspecified trimester: Secondary | ICD-10-CM

## 2021-06-20 NOTE — Progress Notes (Signed)
No vb. No lof.  

## 2021-06-20 NOTE — Progress Notes (Signed)
Routine Prenatal Care Visit  Subjective  Wendy Johnston is a 30 y.o. G1P0000 at [redacted]w[redacted]d being seen today for ongoing prenatal care.  She is currently monitored for the following issues for this low-risk pregnancy and has Iron deficiency anemia; Supervision of high risk pregnancy, antepartum; Previous gastric bypass affecting pregnancy, antepartum; and Obesity affecting pregnancy on their problem list.  ----------------------------------------------------------------------------------- Patient reports no complaints. Her anatomy scan is scheduled. Feeling flutters. She does not know gender and does not want to know till she delivers.  Contractions: Not present. Vag. Bleeding: None.  Movement: Present. Leaking Fluid denies.  ----------------------------------------------------------------------------------- The following portions of the patient's history were reviewed and updated as appropriate: allergies, current medications, past family history, past medical history, past social history, past surgical history and problem list. Problem list updated.  Objective  Blood pressure 116/84, weight 166 lb (75.3 kg), last menstrual period 02/13/2021. Pregravid weight 160 lb (72.6 kg) Total Weight Gain 6 lb (2.722 kg) Urinalysis: Urine Protein    Urine Glucose    Fetal Status:     Movement: Present     General:  Alert, oriented and cooperative. Patient is in no acute distress.  Skin: Skin is warm and dry. No rash noted.   Cardiovascular: Normal heart rate noted  Respiratory: Normal respiratory effort, no problems with respiration noted  Abdomen: Soft, gravid, appropriate for gestational age. Pain/Pressure: Absent     Pelvic:  Cervical exam deferred        Extremities: Normal range of motion.     Mental Status: Normal mood and affect. Normal behavior. Normal judgment and thought content.   Assessment   30 y.o. G1P0000 at [redacted]w[redacted]d by  11/20/2021, by Last Menstrual Period presenting for routine  prenatal visit  Plan   pregnancy 1 Problems (from 03/27/21 to present)    Problem Noted Resolved   Supervision of high risk pregnancy, antepartum 03/27/2021 by Will Bonnet, MD No   Overview Addendum 06/20/2021  2:33 PM by Imagene Riches, CNM     Nursing Staff Provider  Office Location  [facility] Dating   LMP = 7 wk [place]  Language  English Anatomy US  scheduled  Flu Vaccine   Genetic Screen  NIPS: maternT negative- xy. Inheritest neg  TDaP vaccine    Hgb A1C or  GTT Early : Third trimester :   Covid    LAB RESULTS   Rhogam  Not needed Blood Type A/Positive/-- (07/20 1150)   Feeding Plan  Antibody Negative (07/20 1150)  Contraception  Rubella 1.87 (07/20 1150)  Circumcision  RPR Non Reactive (07/20 1150)   Pediatrician   HBsAg Negative (07/20 1150)   Support Person  HIV Non Reactive (07/20 1150)  Prenatal Classes  Discussed Varicella Immune    GBS  (For PCN allergy, check sensitivities)   BTL Consent     VBAC Consent  Pap  2022 NIL    Hgb Electro   n/a    CF neg     SMA neg              Previous gastric bypass affecting pregnancy, antepartum 03/27/2021 by Will Bonnet, MD No   Obesity affecting pregnancy 03/27/2021 by Will Bonnet, MD No       Preterm labor symptoms and general obstetric precautions including but not limited to vaginal bleeding, contractions, leaking of fluid and fetal movement were reviewed in detail with the patient. Please refer to After Visit Summary for other counseling recommendations.   Return  in about 4 weeks (around 07/18/2021) for return OB.  Imagene Riches, CNM  06/20/2021 2:34 PM

## 2021-06-28 ENCOUNTER — Telehealth: Payer: Self-pay | Admitting: Oncology

## 2021-06-28 NOTE — Telephone Encounter (Signed)
Patient called stated she was feeling weak again and wanted Dr Bobby Rumpf to check her Labs out again.  She is currently around [redacted] weeks pregnant.  She saw Dr Bobby Rumpf last as Wendy Johnston

## 2021-07-01 ENCOUNTER — Other Ambulatory Visit: Payer: Self-pay

## 2021-07-01 ENCOUNTER — Encounter: Payer: Self-pay | Admitting: *Deleted

## 2021-07-01 ENCOUNTER — Other Ambulatory Visit: Payer: Self-pay | Admitting: Oncology

## 2021-07-01 ENCOUNTER — Ambulatory Visit: Payer: BC Managed Care – PPO | Admitting: *Deleted

## 2021-07-01 ENCOUNTER — Ambulatory Visit: Payer: BC Managed Care – PPO | Attending: Obstetrics and Gynecology

## 2021-07-01 VITALS — BP 133/62 | HR 65

## 2021-07-01 DIAGNOSIS — O0991 Supervision of high risk pregnancy, unspecified, first trimester: Secondary | ICD-10-CM | POA: Insufficient documentation

## 2021-07-01 DIAGNOSIS — D508 Other iron deficiency anemias: Secondary | ICD-10-CM

## 2021-07-01 DIAGNOSIS — O99212 Obesity complicating pregnancy, second trimester: Secondary | ICD-10-CM | POA: Insufficient documentation

## 2021-07-01 DIAGNOSIS — O0990 Supervision of high risk pregnancy, unspecified, unspecified trimester: Secondary | ICD-10-CM

## 2021-07-01 DIAGNOSIS — O99840 Bariatric surgery status complicating pregnancy, unspecified trimester: Secondary | ICD-10-CM

## 2021-07-01 NOTE — Progress Notes (Signed)
[facility]  [address] [place],  [place]  [id number] [phone]  Clinic Day:  07/02/2021  Referring physician: Philmore Pali, NP  This document serves as a record of services personally performed by Marice Potter, MD. It was created on their behalf by Curry,Lauren E, a trained medical scribe. The creation of this record is based on the scribe's personal observations and the provider's statements to them.  HISTORY OF PRESENT ILLNESS:  The patient is a 30 y.o. female  with iron deficiency anemia.  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  She also had gastric bypass surgery a few years ago.   She comes in today off-schedule as she is concerned she may be iron deficient again.  She has increased fatigue.  She also has had tingling in her hands and around her lips.  Of note, the patient is 20-weeks pregnant.  .  PHYSICAL EXAM:  Blood pressure 138/67, pulse 70, temperature 98.2 F (36.8 C), resp. rate 14, height 5\' 1"  (1.549 m), weight 166 lb 12.8 oz (75.7 kg), last menstrual period 02/13/2021, SpO2 97 %. Wt Readings from Last 3 Encounters:  07/02/21 166 lb 12.8 oz (75.7 kg)  06/20/21 166 lb (75.3 kg)  05/23/21 163 lb 6.4 oz (74.1 kg)   Body mass index is 31.52 kg/m. Performance status (ECOG): 0 - Asymptomatic Physical Exam Constitutional:      Appearance: Normal appearance. She is not ill-appearing.  HENT:     Mouth/Throat:     Mouth: Mucous membranes are moist.     Pharynx: Oropharynx is clear. No oropharyngeal exudate or posterior oropharyngeal erythema.  Cardiovascular:     Rate and Rhythm: Normal rate and regular rhythm.     Heart sounds: No murmur heard.   No friction rub. No gallop.  Pulmonary:     Effort: Pulmonary effort is normal. No respiratory distress.     Breath sounds: Normal breath sounds. No wheezing, rhonchi or rales.  Abdominal:     General: Bowel sounds are  normal. There is no distension.     Palpations: Abdomen is soft. There is no mass.     Tenderness: There is no abdominal tenderness.  Musculoskeletal:        General: No swelling.     Right lower leg: No edema.     Left lower leg: No edema.  Lymphadenopathy:     Cervical: No cervical adenopathy.     Upper Body:     Right upper body: No supraclavicular or axillary adenopathy.     Left upper body: No supraclavicular or axillary adenopathy.     Lower Body: No right inguinal adenopathy. No left inguinal adenopathy.  Skin:    General: Skin is warm.     Coloration: Skin is not jaundiced.     Findings: No lesion or rash.  Neurological:     General: No focal deficit present.     Mental Status: She is alert and oriented to person, place, and time. Mental status is at baseline.  Psychiatric:        Mood and Affect: Mood normal.        Behavior: Behavior normal.        Thought Content: Thought content normal.   LABS:    Ref. Range 07/02/2021 00:00  WBC Unknown 9.6  RBC Latest Ref Range: 3.87 - 5.11  4.47  Hemoglobin Latest Ref Range: 12.0 - 16.0  12.6  HCT Latest Ref Range: 36 - 46  36  MCV Latest Ref Range: 81 - 99  80 (A)  Platelets Latest Ref Range: 150 - 399  238  NEUT# Unknown 7.20    Ref. Range 07/02/2021 16:04  Iron Latest Ref Range: 28 - 170 ug/dL 39  UIBC Latest Units: ug/dL 499  TIBC Latest Ref Range: 250 - 450 ug/dL 538 (H)  Saturation Ratios Latest Ref Range: 10.4 - 31.8 % 7 (L)  Ferritin Latest Ref Range: 11 - 307 ng/mL 5 (L)  Folate Latest Ref Range: >5.9 ng/mL 8.7  Vitamin B12 Latest Ref Range: 180 - 914 pg/mL 112 (L)   ASSESSMENT & PLAN:  A 30 y.o. female with iron deficiency anemia, likely related to her previous gastric bypass surgery.  The patient is also 20-weeks pregnant.  Her labs today show that she is low in both iron and B12.  As she is out of her 1st trimester, I will arrange for her to receive IV iron over these next few weeks.  Furthermore, she will be  placed on a protracted course of B12 to where she will take 1000 mcg daily x 7 days, then weekly x 4 weeks, then monthly indefinitely.  I will see her back in 4 months for repeat clinical assessment.  The patient understands all the plans discussed today and is in agreement with them.    I, Rita Ohara, am acting as scribe for Marice Potter, MD    I have reviewed this report as typed by the medical scribe, and it is complete and accurate.  Dequincy Macarthur Critchley, MD

## 2021-07-02 ENCOUNTER — Other Ambulatory Visit: Payer: Self-pay | Admitting: Oncology

## 2021-07-02 ENCOUNTER — Telehealth: Payer: Self-pay | Admitting: Oncology

## 2021-07-02 ENCOUNTER — Inpatient Hospital Stay (INDEPENDENT_AMBULATORY_CARE_PROVIDER_SITE_OTHER): Payer: BC Managed Care – PPO | Admitting: Oncology

## 2021-07-02 ENCOUNTER — Other Ambulatory Visit: Payer: Self-pay | Admitting: Hematology and Oncology

## 2021-07-02 ENCOUNTER — Other Ambulatory Visit: Payer: Self-pay | Admitting: *Deleted

## 2021-07-02 ENCOUNTER — Inpatient Hospital Stay: Payer: BC Managed Care – PPO | Attending: Oncology

## 2021-07-02 VITALS — BP 138/67 | HR 70 | Temp 98.2°F | Resp 14 | Ht 61.0 in | Wt 166.8 lb

## 2021-07-02 DIAGNOSIS — R5383 Other fatigue: Secondary | ICD-10-CM | POA: Diagnosis not present

## 2021-07-02 DIAGNOSIS — Z331 Pregnant state, incidental: Secondary | ICD-10-CM | POA: Diagnosis not present

## 2021-07-02 DIAGNOSIS — D509 Iron deficiency anemia, unspecified: Secondary | ICD-10-CM | POA: Diagnosis present

## 2021-07-02 DIAGNOSIS — E538 Deficiency of other specified B group vitamins: Secondary | ICD-10-CM | POA: Diagnosis not present

## 2021-07-02 DIAGNOSIS — N920 Excessive and frequent menstruation with regular cycle: Secondary | ICD-10-CM | POA: Insufficient documentation

## 2021-07-02 DIAGNOSIS — O99840 Bariatric surgery status complicating pregnancy, unspecified trimester: Secondary | ICD-10-CM

## 2021-07-02 DIAGNOSIS — D508 Other iron deficiency anemias: Secondary | ICD-10-CM | POA: Diagnosis not present

## 2021-07-02 DIAGNOSIS — Z9884 Bariatric surgery status: Secondary | ICD-10-CM | POA: Insufficient documentation

## 2021-07-02 DIAGNOSIS — Z362 Encounter for other antenatal screening follow-up: Secondary | ICD-10-CM

## 2021-07-02 DIAGNOSIS — Z79899 Other long term (current) drug therapy: Secondary | ICD-10-CM | POA: Insufficient documentation

## 2021-07-02 LAB — FERRITIN: Ferritin: 5 ng/mL — ABNORMAL LOW (ref 11–307)

## 2021-07-02 LAB — CBC AND DIFFERENTIAL
HCT: 36 (ref 36–46)
Hemoglobin: 12.6 (ref 12.0–16.0)
Neutrophils Absolute: 7.2
Platelets: 238 (ref 150–399)
WBC: 9.6

## 2021-07-02 LAB — FOLATE: Folate: 8.7 ng/mL (ref >5.9)

## 2021-07-02 LAB — IRON AND TIBC
Iron: 39 ug/dL (ref 28–170)
Saturation Ratios: 7 % — ABNORMAL LOW (ref 10.4–31.8)
TIBC: 538 ug/dL — ABNORMAL HIGH (ref 250–450)
UIBC: 499 ug/dL

## 2021-07-02 LAB — VITAMIN B12: Vitamin B-12: 112 pg/mL — ABNORMAL LOW (ref 180–914)

## 2021-07-02 LAB — CBC
MCV: 80 — AB (ref 81–99)
RBC: 4.47 (ref 3.87–5.11)

## 2021-07-02 NOTE — Telephone Encounter (Signed)
Per 10/25 LOS, patient scheduled for Feb 2023 Appt's.  Gave patient Appt Summary

## 2021-07-04 ENCOUNTER — Other Ambulatory Visit: Payer: Self-pay

## 2021-07-04 ENCOUNTER — Encounter: Payer: Self-pay | Admitting: Obstetrics & Gynecology

## 2021-07-04 ENCOUNTER — Ambulatory Visit (INDEPENDENT_AMBULATORY_CARE_PROVIDER_SITE_OTHER): Payer: BC Managed Care – PPO | Admitting: Obstetrics & Gynecology

## 2021-07-04 VITALS — BP 120/80 | Wt 168.0 lb

## 2021-07-04 DIAGNOSIS — Z3A20 20 weeks gestation of pregnancy: Secondary | ICD-10-CM

## 2021-07-04 DIAGNOSIS — O0992 Supervision of high risk pregnancy, unspecified, second trimester: Secondary | ICD-10-CM

## 2021-07-04 DIAGNOSIS — O99840 Bariatric surgery status complicating pregnancy, unspecified trimester: Secondary | ICD-10-CM

## 2021-07-04 LAB — POCT URINALYSIS DIPSTICK OB
Glucose, UA: NEGATIVE
POC,PROTEIN,UA: NEGATIVE

## 2021-07-04 NOTE — Patient Instructions (Signed)

## 2021-07-04 NOTE — Progress Notes (Signed)
  Subjective  Fetal Movement? yes Contractions? no Leaking Fluid? no Vaginal Bleeding? no  Objective  BP 120/80   Wt 168 lb (76.2 kg)   LMP 02/13/2021 (Exact Date)   BMI 31.74 kg/m  General: NAD Pumonary: no increased work of breathing Abdomen: gravid, non-tender Extremities: no edema Psychiatric: mood appropriate, affect full  Assessment  30 y.o. G1P0000 at [redacted]w[redacted]d by  11/20/2021, by Last Menstrual Period presenting for routine prenatal visit  Plan   Problem List Items Addressed This Visit      Other   Supervision of high risk pregnancy, antepartum - Primary   Previous gastric bypass affecting pregnancy, antepartum  Other Visit Diagnoses    [redacted] weeks gestation of pregnancy        PNV [place] discussed.  Cont monthly MFM [place] due to h/o gastric bypass Plan week of home BS checks at 26 weeks (avoid glucola due to prior bypass surgery)    Will still need other labs to include Vit D and B12 and Lytes at that time as well  July 2022 Problems (from 03/27/21 to present)    Problem Noted Resolved   Supervision of high risk pregnancy, antepartum     Overview Addendum 07/04/2021  3:58 PM by Gae Dry, MD     Nursing Staff Provider  Office Location  [facility] Dating   LMP = 7 wk [place]  Language  English Anatomy US  [facility] nml  Flu Vaccine   Genetic Screen  NIPS: maternT negative- xy. Inheritest neg  TDaP vaccine    Hgb A1C or  GTT Early : Third trimester :   Covid    LAB RESULTS   Rhogam  Not needed Blood Type A/Positive/-- (07/20 1150)   Feeding Plan  Antibody Negative (07/20 1150)  Contraception  Rubella 1.87 (07/20 1150)  Circumcision  RPR Non Reactive (07/20 1150)   Pediatrician   HBsAg Negative (07/20 1150)   Support Person  HIV Non Reactive (07/20 1150)  Prenatal Classes  Discussed Varicella Immune    GBS  (For PCN allergy, check sensitivities)   BTL Consent     VBAC Consent  Pap  2022 NIL    Hgb Electro   n/a    CF neg     SMA neg              Previous gastric  bypass affecting pregnancy, antepartum     Obesity affecting pregnancy         Barnett Applebaum, MD, [place], [facility] 07/04/2021  4:07 PM

## 2021-07-08 ENCOUNTER — Encounter: Payer: Self-pay | Admitting: Oncology

## 2021-07-08 DIAGNOSIS — E538 Deficiency of other specified B group vitamins: Secondary | ICD-10-CM | POA: Insufficient documentation

## 2021-07-11 ENCOUNTER — Telehealth: Payer: Self-pay | Admitting: Oncology

## 2021-07-11 NOTE — Telephone Encounter (Signed)
07/11/21 spoke with patient and scheduled IV iron

## 2021-07-16 ENCOUNTER — Encounter: Payer: Self-pay | Admitting: Oncology

## 2021-07-16 NOTE — Addendum Note (Signed)
Addended by: Juanetta Beets on: 07/16/2021 04:32 PM   Modules accepted: Orders

## 2021-07-18 MED FILL — Iron Sucrose Inj 20 MG/ML (Fe Equiv): INTRAVENOUS | Qty: 15 | Status: AC

## 2021-07-19 ENCOUNTER — Other Ambulatory Visit: Payer: Self-pay

## 2021-07-19 ENCOUNTER — Encounter: Payer: Self-pay | Admitting: Advanced Practice Midwife

## 2021-07-19 ENCOUNTER — Inpatient Hospital Stay: Payer: BC Managed Care – PPO | Attending: Oncology

## 2021-07-19 ENCOUNTER — Ambulatory Visit (INDEPENDENT_AMBULATORY_CARE_PROVIDER_SITE_OTHER): Payer: BC Managed Care – PPO | Admitting: Advanced Practice Midwife

## 2021-07-19 VITALS — BP 118/68 | Wt 171.0 lb

## 2021-07-19 VITALS — BP 105/62 | HR 81 | Temp 98.3°F | Resp 20 | Wt 171.1 lb

## 2021-07-19 DIAGNOSIS — O0992 Supervision of high risk pregnancy, unspecified, second trimester: Secondary | ICD-10-CM

## 2021-07-19 DIAGNOSIS — O99840 Bariatric surgery status complicating pregnancy, unspecified trimester: Secondary | ICD-10-CM

## 2021-07-19 DIAGNOSIS — D509 Iron deficiency anemia, unspecified: Secondary | ICD-10-CM | POA: Diagnosis present

## 2021-07-19 DIAGNOSIS — E538 Deficiency of other specified B group vitamins: Secondary | ICD-10-CM | POA: Insufficient documentation

## 2021-07-19 DIAGNOSIS — D508 Other iron deficiency anemias: Secondary | ICD-10-CM

## 2021-07-19 DIAGNOSIS — Z3A22 22 weeks gestation of pregnancy: Secondary | ICD-10-CM

## 2021-07-19 DIAGNOSIS — O99212 Obesity complicating pregnancy, second trimester: Secondary | ICD-10-CM

## 2021-07-19 MED ORDER — SODIUM CHLORIDE 0.9 % IV SOLN
300.0000 mg | Freq: Once | INTRAVENOUS | Status: AC
  Administered 2021-07-19: 300 mg via INTRAVENOUS
  Filled 2021-07-19: qty 10

## 2021-07-19 MED ORDER — SODIUM CHLORIDE 0.9 % IV SOLN
Freq: Once | INTRAVENOUS | Status: AC
Start: 2021-07-19 — End: 2021-07-19

## 2021-07-19 NOTE — Addendum Note (Signed)
Addended by: Tamala Julian R on: 07/19/2021 10:20 AM   Modules accepted: Orders

## 2021-07-19 NOTE — Progress Notes (Addendum)
Routine Prenatal Care Visit  Subjective  Wendy Johnston is a 30 y.o. G1P0000 at [redacted]w[redacted]d being seen today for ongoing prenatal care.  She is currently monitored for the following issues for this high-risk pregnancy and has Iron deficiency anemia; Supervision of high risk pregnancy, antepartum; Previous gastric bypass affecting pregnancy, antepartum; Obesity affecting pregnancy; B12 deficiency; and PCOS (polycystic ovarian syndrome) on their problem list.  ----------------------------------------------------------------------------------- Patient reports no complaints.  She has an iron infusion in the next week with her provider in [place]. Contractions: Not present. Vag. Bleeding: None.  Movement: Present. Leaking Fluid denies.  ----------------------------------------------------------------------------------- The following portions of the patient's history were reviewed and updated as appropriate: allergies, current medications, past family history, past medical history, past social history, past surgical history and problem list. Problem list updated.  Objective  Blood pressure 118/68, weight 171 lb (77.6 kg), last menstrual period 02/13/2021. Pregravid weight 160 lb (72.6 kg) Total Weight Gain 11 lb (4.99 kg) Urinalysis: Urine Protein    Urine Glucose    Fetal Status: Fetal Heart Rate (bpm): 134 Fundal Height: 22 cm Movement: Present     General:  Alert, oriented and cooperative. Patient is in no acute distress.  Skin: Skin is warm and dry. No rash noted.   Cardiovascular: Normal heart rate noted  Respiratory: Normal respiratory effort, no problems with respiration noted  Abdomen: Soft, gravid, appropriate for gestational age. Pain/Pressure: Absent     Pelvic:  Cervical exam deferred        Extremities: Normal range of motion.  Edema: None  Mental Status: Normal mood and affect. Normal behavior. Normal judgment and thought content.   Assessment   30 y.o. G1P0000 at [redacted]w[redacted]d by   11/20/2021, by Last Menstrual Period presenting for routine prenatal visit  Plan   July 2022 Problems (from 03/27/21 to present)     Problem Noted Resolved   Supervision of high risk pregnancy, antepartum 03/27/2021 by Will Bonnet, MD No   Overview Addendum 07/04/2021  3:58 PM by Gae Dry, MD     Nursing Staff Provider  Office Location  [facility] Dating   LMP = 7 wk [place]  Language  English Anatomy US  [facility] nml  Flu Vaccine   Genetic Screen  NIPS: maternT negative- xy. Inheritest neg  TDaP vaccine    Hgb A1C or  GTT Early : Third trimester :   Covid    LAB RESULTS   Rhogam  Not needed Blood Type A/Positive/-- (07/20 1150)   Feeding Plan  Antibody Negative (07/20 1150)  Contraception  Rubella 1.87 (07/20 1150)  Circumcision  RPR Non Reactive (07/20 1150)   Pediatrician   HBsAg Negative (07/20 1150)   Support Person  HIV Non Reactive (07/20 1150)  Prenatal Classes  Discussed Varicella Immune    GBS  (For PCN allergy, check sensitivities)   BTL Consent     VBAC Consent  Pap  2022 NIL    Hgb Electro   n/a    CF neg     SMA neg              Previous gastric bypass affecting pregnancy, antepartum 03/27/2021 by Will Bonnet, MD No   Obesity affecting pregnancy 03/27/2021 by Will Bonnet, MD No        Preterm labor symptoms and general obstetric precautions including but not limited to vaginal bleeding, contractions, leaking of fluid and fetal movement were reviewed in detail with the patient. Please refer to After Visit Summary  for other counseling recommendations.   Return in about 4 weeks (around 08/16/2021) for rob.  Rod Can, CNM 07/19/2021 10:13 AM

## 2021-07-19 NOTE — Progress Notes (Signed)
ROB - no concerns. RM 4

## 2021-07-19 NOTE — Patient Instructions (Signed)

## 2021-07-22 MED FILL — Iron Sucrose Inj 20 MG/ML (Fe Equiv): INTRAVENOUS | Qty: 15 | Status: AC

## 2021-07-23 ENCOUNTER — Inpatient Hospital Stay: Payer: BC Managed Care – PPO

## 2021-07-23 ENCOUNTER — Other Ambulatory Visit: Payer: Self-pay

## 2021-07-23 VITALS — BP 109/62 | HR 74 | Temp 98.0°F | Resp 18 | Ht 61.0 in | Wt 175.1 lb

## 2021-07-23 DIAGNOSIS — D508 Other iron deficiency anemias: Secondary | ICD-10-CM

## 2021-07-23 DIAGNOSIS — D509 Iron deficiency anemia, unspecified: Secondary | ICD-10-CM | POA: Diagnosis not present

## 2021-07-23 MED ORDER — SODIUM CHLORIDE 0.9 % IV SOLN: Freq: Once | INTRAVENOUS | Status: AC

## 2021-07-23 MED ORDER — SODIUM CHLORIDE 0.9 % IV SOLN
300.0000 mg | Freq: Once | INTRAVENOUS | Status: AC
  Administered 2021-07-23: 300 mg via INTRAVENOUS
  Filled 2021-07-23: qty 100

## 2021-07-23 NOTE — Patient Instructions (Signed)

## 2021-07-26 ENCOUNTER — Ambulatory Visit: Payer: BC Managed Care – PPO | Attending: Obstetrics

## 2021-07-26 ENCOUNTER — Other Ambulatory Visit: Payer: Self-pay | Admitting: *Deleted

## 2021-07-26 ENCOUNTER — Ambulatory Visit: Payer: BC Managed Care – PPO | Admitting: *Deleted

## 2021-07-26 ENCOUNTER — Other Ambulatory Visit: Payer: Self-pay

## 2021-07-26 ENCOUNTER — Encounter: Payer: Self-pay | Admitting: *Deleted

## 2021-07-26 VITALS — BP 127/66 | HR 76

## 2021-07-26 DIAGNOSIS — O99212 Obesity complicating pregnancy, second trimester: Secondary | ICD-10-CM | POA: Insufficient documentation

## 2021-07-26 DIAGNOSIS — O99840 Bariatric surgery status complicating pregnancy, unspecified trimester: Secondary | ICD-10-CM | POA: Diagnosis not present

## 2021-07-26 DIAGNOSIS — O0990 Supervision of high risk pregnancy, unspecified, unspecified trimester: Secondary | ICD-10-CM

## 2021-07-26 DIAGNOSIS — Z9884 Bariatric surgery status: Secondary | ICD-10-CM

## 2021-07-26 DIAGNOSIS — E669 Obesity, unspecified: Secondary | ICD-10-CM | POA: Diagnosis not present

## 2021-07-26 DIAGNOSIS — O99842 Bariatric surgery status complicating pregnancy, second trimester: Secondary | ICD-10-CM

## 2021-07-26 DIAGNOSIS — Z362 Encounter for other antenatal screening follow-up: Secondary | ICD-10-CM | POA: Diagnosis not present

## 2021-07-26 DIAGNOSIS — Z3A23 23 weeks gestation of pregnancy: Secondary | ICD-10-CM

## 2021-07-29 MED FILL — Iron Sucrose Inj 20 MG/ML (Fe Equiv): INTRAVENOUS | Qty: 15 | Status: AC

## 2021-07-30 ENCOUNTER — Encounter: Payer: Self-pay | Admitting: Oncology

## 2021-07-30 ENCOUNTER — Inpatient Hospital Stay: Payer: BC Managed Care – PPO

## 2021-07-30 ENCOUNTER — Other Ambulatory Visit: Payer: Self-pay

## 2021-07-30 ENCOUNTER — Ambulatory Visit: Payer: BC Managed Care – PPO

## 2021-07-30 VITALS — BP 112/61 | HR 66 | Temp 98.0°F | Resp 18 | Ht 61.0 in | Wt 175.1 lb

## 2021-07-30 DIAGNOSIS — D508 Other iron deficiency anemias: Secondary | ICD-10-CM

## 2021-07-30 DIAGNOSIS — D509 Iron deficiency anemia, unspecified: Secondary | ICD-10-CM | POA: Diagnosis not present

## 2021-07-30 MED ORDER — SODIUM CHLORIDE 0.9 % IV SOLN
300.0000 mg | Freq: Once | INTRAVENOUS | Status: AC
  Administered 2021-07-30: 300 mg via INTRAVENOUS
  Filled 2021-07-30: qty 300

## 2021-07-30 MED ORDER — SODIUM CHLORIDE 0.9 % IV SOLN
Freq: Once | INTRAVENOUS | Status: AC
Start: 2021-07-30 — End: 2021-07-30

## 2021-07-30 MED ORDER — CYANOCOBALAMIN 1000 MCG/ML IJ SOLN
1000.0000 ug | Freq: Once | INTRAMUSCULAR | Status: AC
  Administered 2021-07-30: 1000 ug via INTRAMUSCULAR
  Filled 2021-07-30: qty 1

## 2021-07-30 NOTE — Patient Instructions (Signed)

## 2021-08-06 ENCOUNTER — Other Ambulatory Visit: Payer: Self-pay

## 2021-08-06 ENCOUNTER — Encounter: Payer: Self-pay | Admitting: Oncology

## 2021-08-06 ENCOUNTER — Inpatient Hospital Stay: Payer: BC Managed Care – PPO

## 2021-08-06 VITALS — BP 123/50 | HR 69 | Temp 97.8°F | Resp 18 | Wt 177.0 lb

## 2021-08-06 DIAGNOSIS — D508 Other iron deficiency anemias: Secondary | ICD-10-CM

## 2021-08-06 DIAGNOSIS — D509 Iron deficiency anemia, unspecified: Secondary | ICD-10-CM | POA: Diagnosis not present

## 2021-08-06 MED ORDER — CYANOCOBALAMIN 1000 MCG/ML IJ SOLN
1000.0000 ug | Freq: Once | INTRAMUSCULAR | Status: AC
  Administered 2021-08-06: 1000 ug via INTRAMUSCULAR

## 2021-08-13 ENCOUNTER — Ambulatory Visit: Payer: BC Managed Care – PPO

## 2021-08-14 ENCOUNTER — Other Ambulatory Visit: Payer: Self-pay

## 2021-08-14 ENCOUNTER — Ambulatory Visit (INDEPENDENT_AMBULATORY_CARE_PROVIDER_SITE_OTHER): Payer: BC Managed Care – PPO | Admitting: Obstetrics and Gynecology

## 2021-08-14 VITALS — BP 100/70 | Wt 178.0 lb

## 2021-08-14 DIAGNOSIS — O99840 Bariatric surgery status complicating pregnancy, unspecified trimester: Secondary | ICD-10-CM

## 2021-08-14 DIAGNOSIS — Z113 Encounter for screening for infections with a predominantly sexual mode of transmission: Secondary | ICD-10-CM

## 2021-08-14 DIAGNOSIS — Z131 Encounter for screening for diabetes mellitus: Secondary | ICD-10-CM

## 2021-08-14 DIAGNOSIS — Z3A26 26 weeks gestation of pregnancy: Secondary | ICD-10-CM

## 2021-08-14 DIAGNOSIS — Z130 Encounter for screening for diseases of the blood and blood-forming organs and certain disorders involving the immune mechanism: Secondary | ICD-10-CM

## 2021-08-14 DIAGNOSIS — O0992 Supervision of high risk pregnancy, unspecified, second trimester: Secondary | ICD-10-CM

## 2021-08-14 MED ORDER — ACCU-CHEK SOFTCLIX LANCETS MISC: 1.0000 | Freq: Three times a day (TID) | 0 refills | Status: DC

## 2021-08-14 MED ORDER — ACCU-CHEK GUIDE W/DEVICE KIT: PACK | 0 refills | Status: DC

## 2021-08-14 MED ORDER — ACCU-CHEK GUIDE VI STRP: ORAL_STRIP | 12 refills | Status: DC

## 2021-08-14 NOTE — Progress Notes (Signed)
ROB- no concerns 

## 2021-08-14 NOTE — Addendum Note (Signed)
Addended by: Ardeth Perfect B on: 23/05/5319 04:17 PM   Modules accepted: Orders

## 2021-08-14 NOTE — Progress Notes (Addendum)
  Subjective  Fetal Movement? yes Contractions? no Leaking Fluid? no Vaginal Bleeding? no PNVs? yes Getting B12 injections and just had Fe infusion this wk for anemia. Breast/condoms. Seeing MFM for u/s.  Objective  BP 100/70   Wt 178 lb (80.7 kg)   LMP 02/13/2021 (Exact Date)   BMI 33.63 kg/m  General: NAD Pulmonary: no increased work of breathing Abdomen: gravid, non-tender Extremities: no edema Psychiatric: mood appropriate, affect full  Assessment  30 y.o. G1P0000 at [redacted]w[redacted]d by  11/20/2021, by Last Menstrual Period presenting for routine prenatal visit  Plan   Problem List Items Addressed This Visit       Other   Previous gastric bypass affecting pregnancy, antepartum   Relevant Medications   glucose blood (ACCU-CHEK GUIDE) test strip   Accu-Chek Softclix Lancets lancets   Blood Glucose Monitoring Suppl (ACCU-CHEK GUIDE) w/Device KIT   Other Relevant Orders   B12   CBC with Differential/Platelet   Comprehensive metabolic panel   Folate   Hemoglobin A1c   Hepatitis B surface antigen   HIV Antibody (routine testing w rflx)   Iron, TIBC and Ferritin Panel   RPR   Other Visit Diagnoses     Supervision of high risk pregnancy in second trimester    -  Primary   Relevant Medications   glucose blood (ACCU-CHEK GUIDE) test strip   Accu-Chek Softclix Lancets lancets   Blood Glucose Monitoring Suppl (ACCU-CHEK GUIDE) w/Device KIT   Other Relevant Orders   B12   CBC with Differential/Platelet   Comprehensive metabolic panel   Folate   Hemoglobin A1c   Hepatitis B surface antigen   HIV Antibody (routine testing w rflx)   Iron, TIBC and Ferritin Panel   RPR   [redacted] weeks gestation of pregnancy       Relevant Medications   glucose blood (ACCU-CHEK GUIDE) test strip   Accu-Chek Softclix Lancets lancets   Blood Glucose Monitoring Suppl (ACCU-CHEK GUIDE) w/Device KIT   Other Relevant Orders   Comprehensive metabolic panel   Hemoglobin A1c   Screening for STD  (sexually transmitted disease)       Relevant Orders   Hepatitis B surface antigen   HIV Antibody (routine testing w rflx)   RPR   Screening for deficiency anemia       Relevant Orders   B12   CBC with Differential/Platelet   Folate   Iron, TIBC and Ferritin Panel   Screening for diabetes mellitus       Relevant Medications   glucose blood (ACCU-CHEK GUIDE) test strip   Accu-Chek Softclix Lancets lancets   Blood Glucose Monitoring Suppl (ACCU-CHEK GUIDE) w/Device KIT   Other Relevant Orders   Hemoglobin A1c     Needs 28 wk labs in 2 wks without glucola. Will do glucometer Rx to pharm (per pharm, her [employer] requires Accu-chek guide). Pt to do fasting and 2 PP BS QD for 1 wk then submit values to [place].    Return in about 2 wk labs, 4 wks ROB, 6 wk ROB, 8 wk ROB.   Firas Guardado B. Whitnee Orzel, PA-C [facility],  08/14/2021  4:17 PM

## 2021-08-15 ENCOUNTER — Inpatient Hospital Stay: Payer: BC Managed Care – PPO | Attending: Oncology

## 2021-08-15 ENCOUNTER — Other Ambulatory Visit: Payer: Self-pay | Admitting: Pharmacist

## 2021-08-15 VITALS — BP 120/51 | HR 57 | Temp 98.2°F | Resp 18 | Wt 180.0 lb

## 2021-08-15 DIAGNOSIS — Z79899 Other long term (current) drug therapy: Secondary | ICD-10-CM | POA: Diagnosis not present

## 2021-08-15 DIAGNOSIS — D509 Iron deficiency anemia, unspecified: Secondary | ICD-10-CM | POA: Diagnosis not present

## 2021-08-15 DIAGNOSIS — E538 Deficiency of other specified B group vitamins: Secondary | ICD-10-CM | POA: Insufficient documentation

## 2021-08-15 DIAGNOSIS — D508 Other iron deficiency anemias: Secondary | ICD-10-CM

## 2021-08-15 MED ORDER — CYANOCOBALAMIN 1000 MCG/ML IJ SOLN
1000.0000 ug | Freq: Once | INTRAMUSCULAR | Status: AC
  Administered 2021-08-15: 1000 ug via INTRAMUSCULAR
  Filled 2021-08-15: qty 1

## 2021-08-15 NOTE — Patient Instructions (Signed)
Vitamin B12 Deficiency ?Vitamin B12 deficiency occurs when the body does not have enough vitamin B12, which is an important vitamin. The body needs this vitamin: ?To make red blood cells. ?To make DNA. This is the genetic material inside cells. ?To help the nerves work properly so they can carry messages from the brain to the body. ?Vitamin B12 deficiency can cause various health problems, such as a low red blood cell count (anemia) or nerve damage. ?What are the causes? ?This condition may be caused by: ?Not eating enough foods that contain vitamin B12. ?Not having enough stomach acid and digestive fluids to properly absorb vitamin B12 from the food that you eat. ?Certain digestive system diseases that make it hard to absorb vitamin B12. These diseases include Crohn's disease, chronic pancreatitis, and cystic fibrosis. ?A condition in which the body does not make enough of a protein (intrinsic factor), resulting in too few red blood cells (pernicious anemia). ?Having a surgery in which part of the stomach or small intestine is removed. ?Taking certain medicines that make it hard for the body to absorb vitamin B12. These medicines include: ?Heartburn medicines (antacids and proton pump inhibitors). ?Certain antibiotic medicines. ?Some medicines that are used to treat diabetes, tuberculosis, gout, or high cholesterol. ?What increases the risk? ?The following factors may make you more likely to develop a B12 deficiency: ?Being older than age 50. ?Eating a vegetarian or vegan diet, especially while you are pregnant. ?Eating a poor diet while you are pregnant. ?Taking certain medicines. ?Having alcoholism. ?What are the signs or symptoms? ?In some cases, there are no symptoms of this condition. If the condition leads to anemia or nerve damage, various symptoms can occur, such as: ?Weakness. ?Fatigue. ?Loss of appetite. ?Weight loss. ?Numbness or tingling in your hands and feet. ?Redness and burning of the  tongue. ?Confusion or memory problems. ?Depression. ?Sensory problems, such as color blindness, ringing in the ears, or loss of taste. ?Diarrhea or constipation. ?Trouble walking. ?If anemia is severe, symptoms can include: ?Shortness of breath. ?Dizziness. ?Rapid heart rate (tachycardia). ?How is this diagnosed? ?This condition may be diagnosed with a blood test to measure the level of vitamin B12 in your blood. You may also have other tests, including: ?A group of tests that measure certain characteristics of blood cells (complete blood count, CBC). ?A blood test to measure intrinsic factor. ?A procedure where a thin tube with a camera on the end is used to look into your stomach or intestines (endoscopy). ?Other tests may be needed to discover the cause of B12 deficiency. ?How is this treated? ?Treatment for this condition depends on the cause. This condition may be treated by: ?Changing your eating and drinking habits, such as: ?Eating more foods that contain vitamin B12. ?Drinking less alcohol or no alcohol. ?Getting vitamin B12 injections. ?Taking vitamin B12 supplements. Your health care provider will tell you which dosage is best for you. ?Follow these instructions at home: ?Eating and drinking ? ?Eat lots of healthy foods that contain vitamin B12, including: ?Meats and poultry. This includes beef, pork, chicken, turkey, and organ meats, such as liver. ?Seafood. This includes clams, rainbow trout, salmon, tuna, and haddock. ?Eggs. ?Cereal and dairy products that are fortified. This means that vitamin B12 has been added to the food. Check the label on the package to see if the food is fortified. ?The items listed above may not be a complete list of recommended foods and beverages. Contact a dietitian for more information. ?General instructions ?Get any   injections that are prescribed by your health care provider. ?Take supplements only as told by your health care provider. Follow the directions carefully. ?Do  not drink alcohol if your health care provider tells you not to. In some cases, you may only be asked to limit alcohol use. ?Keep all follow-up visits as told by your health care provider. This is important. ?Contact a health care provider if: ?Your symptoms come back. ?Get help right away if you: ?Develop shortness of breath. ?Have a rapid heart rate. ?Have chest pain. ?Become dizzy or lose consciousness. ?Summary ?Vitamin B12 deficiency occurs when the body does not have enough vitamin B12. ?The main causes of vitamin B12 deficiency include dietary deficiency, digestive diseases, pernicious anemia, and having a surgery in which part of the stomach or small intestine is removed. ?In some cases, there are no symptoms of this condition. If the condition leads to anemia or nerve damage, various symptoms can occur, such as weakness, shortness of breath, and numbness. ?Treatment may include getting vitamin B12 injections or taking vitamin B12 supplements. Eat lots of healthy foods that contain vitamin B12. ?This information is not intended to replace advice given to you by your health care provider. Make sure you discuss any questions you have with your health care provider. ?Document Revised: 02/20/2021 Document Reviewed: 05/04/2018 ?Elsevier Patient Education ? 2022 [employer] ? ?

## 2021-08-19 ENCOUNTER — Encounter: Payer: Self-pay | Admitting: Oncology

## 2021-08-19 NOTE — Addendum Note (Signed)
Addended by: Juanetta Beets on: 08/19/2021 02:24 PM   Modules accepted: Orders

## 2021-08-20 ENCOUNTER — Ambulatory Visit: Payer: BC Managed Care – PPO

## 2021-08-21 ENCOUNTER — Inpatient Hospital Stay: Payer: BC Managed Care – PPO

## 2021-08-21 ENCOUNTER — Other Ambulatory Visit: Payer: Self-pay

## 2021-08-21 VITALS — BP 121/60 | HR 70 | Temp 98.0°F | Resp 18 | Wt 181.0 lb

## 2021-08-21 DIAGNOSIS — D508 Other iron deficiency anemias: Secondary | ICD-10-CM

## 2021-08-21 DIAGNOSIS — D509 Iron deficiency anemia, unspecified: Secondary | ICD-10-CM | POA: Diagnosis not present

## 2021-08-21 MED ORDER — CYANOCOBALAMIN 1000 MCG/ML IJ SOLN
1000.0000 ug | Freq: Once | INTRAMUSCULAR | Status: AC
  Administered 2021-08-21: 1000 ug via INTRAMUSCULAR
  Filled 2021-08-21: qty 1

## 2021-08-21 NOTE — Patient Instructions (Signed)
Vitamin B12 Injection °What is this medication? °Vitamin B12 (VAHY tuh min B12) prevents and treats low vitamin B12 levels in your body. It is used in people who do not get enough vitamin B12 from their diet or when their digestive tract does not absorb enough. Vitamin B12 plays an important role in maintaining the health of your nervous system and red blood cells. °This medicine may be used for other purposes; ask your health care provider or pharmacist if you have questions. °COMMON BRAND NAME(S): B-12 Compliance Kit, B-12 Injection Kit, Cyomin, Dodex, LA-12, Nutri-Twelve, Physicians EZ Use B-12, Primabalt °What should I tell my care team before I take this medication? °They need to know if you have any of these conditions: °Kidney disease °Leber's disease °Megaloblastic anemia °An unusual or allergic reaction to cyanocobalamin, cobalt, other medications, foods, dyes, or preservatives °Pregnant or trying to get pregnant °Breast-feeding °How should I use this medication? °This medication is injected into a muscle or deeply under the skin. It is usually given in a clinic or care team's office. However, your care team may teach you how to inject yourself. Follow all instructions. °Talk to your care team about the use of this medication in children. Special care may be needed. °Overdosage: If you think you have taken too much of this medicine contact a poison control center or emergency room at once. °NOTE: This medicine is only for you. Do not share this medicine with others. °What if I miss a dose? °If you are given your dose at a clinic or care team's office, call to reschedule your appointment. If you give your own injections, and you miss a dose, take it as soon as you can. If it is almost time for your next dose, take only that dose. Do not take double or extra doses. °What may interact with this medication? °Colchicine °Heavy alcohol intake °This list may not describe all possible interactions. Give your health  care provider a list of all the medicines, herbs, non-prescription drugs, or dietary supplements you use. Also tell them if you smoke, drink alcohol, or use illegal drugs. Some items may interact with your medicine. °What should I watch for while using this medication? °Visit your care team regularly. You may need blood work done while you are taking this medication. °You may need to follow a special diet. Talk to your care team. Limit your alcohol intake and avoid smoking to get the best benefit. °What side effects may I notice from receiving this medication? °Side effects that you should report to your care team as soon as possible: °Allergic reactions--skin rash, itching, hives, swelling of the face, lips, tongue, or throat °Swelling of the ankles, hands, or feet °Trouble breathing °Side effects that usually do not require medical attention (report to your care team if they continue or are bothersome): °Diarrhea °This list may not describe all possible side effects. Call your doctor for medical advice about side effects. You may report side effects to FDA at 1-800-FDA-1088. °Where should I keep my medication? °Keep out of the reach of children. °Store at room temperature between 15 and 30 degrees C (59 and 85 degrees F). Protect from light. Throw away any unused medication after the expiration date. °NOTE: This sheet is a summary. It may not cover all possible information. If you have questions about this medicine, talk to your doctor, pharmacist, or health care provider. °© 2022 Elsevier/Gold Standard (2020-11-07 00:00:00) ° °

## 2021-08-25 ENCOUNTER — Encounter: Payer: Self-pay | Admitting: Obstetrics and Gynecology

## 2021-08-26 NOTE — Telephone Encounter (Signed)
Pt did fasting glucose and 2-2hr post prandial. I think these look fine, correct?

## 2021-08-28 ENCOUNTER — Other Ambulatory Visit: Payer: Self-pay

## 2021-08-28 ENCOUNTER — Other Ambulatory Visit: Payer: BC Managed Care – PPO

## 2021-08-28 DIAGNOSIS — O99840 Bariatric surgery status complicating pregnancy, unspecified trimester: Secondary | ICD-10-CM

## 2021-08-28 DIAGNOSIS — Z113 Encounter for screening for infections with a predominantly sexual mode of transmission: Secondary | ICD-10-CM

## 2021-08-28 DIAGNOSIS — Z131 Encounter for screening for diabetes mellitus: Secondary | ICD-10-CM

## 2021-08-28 DIAGNOSIS — Z3A26 26 weeks gestation of pregnancy: Secondary | ICD-10-CM

## 2021-08-28 DIAGNOSIS — O0992 Supervision of high risk pregnancy, unspecified, second trimester: Secondary | ICD-10-CM

## 2021-08-28 DIAGNOSIS — Z130 Encounter for screening for diseases of the blood and blood-forming organs and certain disorders involving the immune mechanism: Secondary | ICD-10-CM

## 2021-08-29 LAB — COMPREHENSIVE METABOLIC PANEL
ALT: 6 IU/L (ref 0–32)
AST: 13 IU/L (ref 0–40)
Albumin/Globulin Ratio: 1.6 (ref 1.2–2.2)
Albumin: 3.3 g/dL — ABNORMAL LOW (ref 3.9–5.0)
Alkaline Phosphatase: 60 IU/L (ref 44–121)
BUN/Creatinine Ratio: 22 (ref 9–23)
BUN: 10 mg/dL (ref 6–20)
Bilirubin Total: <0.2 mg/dL (ref 0.0–1.2)
CO2: 20 mmol/L (ref 20–29)
Calcium: 8.5 mg/dL — ABNORMAL LOW (ref 8.7–10.2)
Chloride: 107 mmol/L — ABNORMAL HIGH (ref 96–106)
Creatinine, Ser: 0.46 mg/dL — ABNORMAL LOW (ref 0.57–1.00)
Globulin, Total: 2.1 g/dL (ref 1.5–4.5)
Glucose: 81 mg/dL (ref 70–99)
Potassium: 4.3 mmol/L (ref 3.5–5.2)
Sodium: 139 mmol/L (ref 134–144)
Total Protein: 5.4 g/dL — ABNORMAL LOW (ref 6.0–8.5)
eGFR: 132 mL/min/{1.73_m2} (ref >59)

## 2021-08-29 LAB — CBC WITH DIFFERENTIAL/PLATELET
Basophils Absolute: 0.1 10*3/uL (ref 0.0–0.2)
Basos: 1 %
EOS (ABSOLUTE): 0 10*3/uL (ref 0.0–0.4)
Eos: 0 %
Hematocrit: 40.5 % (ref 34.0–46.6)
Hemoglobin: 13.2 g/dL (ref 11.1–15.9)
Immature Grans (Abs): 0.3 10*3/uL — ABNORMAL HIGH (ref 0.0–0.1)
Immature Granulocytes: 4 %
Lymphocytes Absolute: 1.4 10*3/uL (ref 0.7–3.1)
Lymphs: 18 %
MCH: 27.6 pg (ref 26.6–33.0)
MCHC: 32.6 g/dL (ref 31.5–35.7)
MCV: 85 fL (ref 79–97)
Monocytes Absolute: 0.4 10*3/uL (ref 0.1–0.9)
Monocytes: 6 %
Neutrophils Absolute: 5.5 10*3/uL (ref 1.4–7.0)
Neutrophils: 71 %
Platelets: 203 10*3/uL (ref 150–450)
RBC: 4.79 x10E6/uL (ref 3.77–5.28)
RDW: 13.5 % (ref 11.7–15.4)
WBC: 7.8 10*3/uL (ref 3.4–10.8)

## 2021-08-29 LAB — HEMOGLOBIN A1C
Est. average glucose Bld gHb Est-mCnc: 97 mg/dL
Hgb A1c MFr Bld: 5 % (ref 4.8–5.6)

## 2021-08-29 LAB — IRON,TIBC AND FERRITIN PANEL
Ferritin: 116 ng/mL (ref 15–150)
Iron Saturation: 23 % (ref 15–55)
Iron: 87 ug/dL (ref 27–159)
Total Iron Binding Capacity: 373 ug/dL (ref 250–450)
UIBC: 286 ug/dL (ref 131–425)

## 2021-08-29 LAB — HIV ANTIBODY (ROUTINE TESTING W REFLEX): HIV Screen 4th Generation wRfx: NONREACTIVE

## 2021-08-29 LAB — RPR: RPR Ser Ql: NONREACTIVE

## 2021-08-29 LAB — HEPATITIS B SURFACE ANTIGEN: Hepatitis B Surface Ag: NEGATIVE

## 2021-08-29 LAB — VITAMIN B12: Vitamin B-12: 289 pg/mL (ref 232–1245)

## 2021-08-29 LAB — FOLATE: Folate: 4.9 ng/mL (ref >3.0)

## 2021-08-30 ENCOUNTER — Ambulatory Visit: Payer: BC Managed Care – PPO | Admitting: *Deleted

## 2021-08-30 ENCOUNTER — Encounter: Payer: Self-pay | Admitting: *Deleted

## 2021-08-30 ENCOUNTER — Other Ambulatory Visit: Payer: Self-pay

## 2021-08-30 ENCOUNTER — Ambulatory Visit: Payer: BC Managed Care – PPO | Attending: Obstetrics and Gynecology

## 2021-08-30 ENCOUNTER — Other Ambulatory Visit: Payer: Self-pay | Admitting: *Deleted

## 2021-08-30 VITALS — BP 136/68 | HR 71

## 2021-08-30 DIAGNOSIS — O99840 Bariatric surgery status complicating pregnancy, unspecified trimester: Secondary | ICD-10-CM | POA: Insufficient documentation

## 2021-08-30 DIAGNOSIS — E669 Obesity, unspecified: Secondary | ICD-10-CM | POA: Diagnosis not present

## 2021-08-30 DIAGNOSIS — O99213 Obesity complicating pregnancy, third trimester: Secondary | ICD-10-CM | POA: Insufficient documentation

## 2021-08-30 DIAGNOSIS — Z3A28 28 weeks gestation of pregnancy: Secondary | ICD-10-CM

## 2021-08-30 DIAGNOSIS — Z9884 Bariatric surgery status: Secondary | ICD-10-CM | POA: Diagnosis present

## 2021-08-30 DIAGNOSIS — O0990 Supervision of high risk pregnancy, unspecified, unspecified trimester: Secondary | ICD-10-CM

## 2021-08-30 DIAGNOSIS — O99843 Bariatric surgery status complicating pregnancy, third trimester: Secondary | ICD-10-CM | POA: Diagnosis not present

## 2021-08-30 DIAGNOSIS — Z6831 Body mass index (BMI) 31.0-31.9, adult: Secondary | ICD-10-CM

## 2021-09-08 NOTE — L&D Delivery Note (Signed)
Delivery Note ?Primary OB: [facility] ?Delivery Physician: Barnett Applebaum, MD ?Gestational Age: Full term ?Antepartum complications: ART pregnancy (Letrozol), history of gastric bypass surgery ?Intrapartum complications: None ? ?A viable Female was delivered via vertex presentation. "Wendy Johnston" ?Apgars:8 ,9  ?Weight:  pending .   ?Placenta status: spontaneous and Intact.  Cord: 3+ vessels;  with the following complications: nuchal. ? ?Anesthesia:  none ?Episiotomy:  none ?Lacerations:  2nd ?Suture Repair: 2.0 vicryl ?Est. Blood Loss (mL):   500 mL ? ?Mom to postpartum.  Baby to Couplet care / Skin to Skin. ? ?Barnett Applebaum, MD, [place] ?[facility], [place] ?11/23/2021  8:36 AM ?[phone] ? ?

## 2021-09-12 ENCOUNTER — Other Ambulatory Visit: Payer: BC Managed Care – PPO

## 2021-09-12 ENCOUNTER — Ambulatory Visit: Payer: BC Managed Care – PPO | Admitting: Oncology

## 2021-09-12 ENCOUNTER — Other Ambulatory Visit: Payer: Self-pay

## 2021-09-12 ENCOUNTER — Ambulatory Visit (INDEPENDENT_AMBULATORY_CARE_PROVIDER_SITE_OTHER): Payer: BC Managed Care – PPO | Admitting: Obstetrics

## 2021-09-12 VITALS — BP 128/74

## 2021-09-12 DIAGNOSIS — Z23 Encounter for immunization: Secondary | ICD-10-CM | POA: Diagnosis not present

## 2021-09-12 DIAGNOSIS — O0990 Supervision of high risk pregnancy, unspecified, unspecified trimester: Secondary | ICD-10-CM

## 2021-09-12 DIAGNOSIS — Z3A30 30 weeks gestation of pregnancy: Secondary | ICD-10-CM

## 2021-09-12 NOTE — Progress Notes (Signed)
TDAP today. No vb. No lof.

## 2021-09-12 NOTE — Progress Notes (Signed)
Routine Prenatal Care Visit  Subjective  Wendy Johnston is a 31 y.o. G1P0000 at [redacted]w[redacted]d being seen today for ongoing prenatal care.  She is currently monitored for the following issues for this high-risk pregnancy and has Iron deficiency anemia; Supervision of high risk pregnancy, antepartum; Previous gastric bypass affecting pregnancy, antepartum; Obesity affecting pregnancy; B12 deficiency; and PCOS (polycystic ovarian syndrome) on their problem list.  ----------------------------------------------------------------------------------- Patient reports no complaints.   Contractions: Not present. Vag. Bleeding: None.  Movement: Present. Leaking Fluid denies.  ----------------------------------------------------------------------------------- The following portions of the patient's history were reviewed and updated as appropriate: allergies, current medications, past family history, past medical history, past social history, past surgical history and problem list. Problem list updated.  Objective  Blood pressure 128/74, last menstrual period 02/13/2021. Pregravid weight 160 lb (72.6 kg) Total Weight Gain 18 lb (8.165 kg) Urinalysis: Urine Protein    Urine Glucose    Fetal Status:     Movement: Present     General:  Alert, oriented and cooperative. Patient is in no acute distress.  Skin: Skin is warm and dry. No rash noted.   Cardiovascular: Normal heart rate noted  Respiratory: Normal respiratory effort, no problems with respiration noted  Abdomen: Soft, gravid, appropriate for gestational age. Pain/Pressure: Absent     Pelvic:  Cervical exam deferred        Extremities: Normal range of motion.     Mental Status: Normal mood and affect. Normal behavior. Normal judgment and thought content.   Assessment   31 y.o. G1P0000 at [redacted]w[redacted]d by  11/20/2021, by Last Menstrual Period presenting for routine prenatal visit  Plan   July 2022 Problems (from 03/27/21 to present)    Problem Noted  Resolved   Supervision of high risk pregnancy, antepartum 03/27/2021 by Will Bonnet, MD No   Overview Addendum 07/04/2021  3:58 PM by Gae Dry, MD     Nursing Staff Provider  Office Location  [facility] Dating   LMP = 7 wk [place]  Language  English Anatomy US  [facility] nml  Flu Vaccine   Genetic Screen  NIPS: maternT negative- xy. Inheritest neg  TDaP vaccine    Hgb A1C or  GTT Early : Third trimester :   Covid    LAB RESULTS   Rhogam  Not needed Blood Type A/Positive/-- (07/20 1150)   Feeding Plan  Antibody Negative (07/20 1150)  Contraception  Rubella 1.87 (07/20 1150)  Circumcision  RPR Non Reactive (07/20 1150)   Pediatrician   HBsAg Negative (07/20 1150)   Support Person  HIV Non Reactive (07/20 1150)  Prenatal Classes  Discussed Varicella Immune    GBS  (For PCN allergy, check sensitivities)   BTL Consent     VBAC Consent  Pap  2022 NIL    Hgb Electro   n/a    CF neg     SMA neg              Previous gastric bypass affecting pregnancy, antepartum 03/27/2021 by Will Bonnet, MD No   Obesity affecting pregnancy 03/27/2021 by Will Bonnet, MD No       Preterm labor symptoms and general obstetric precautions including but not limited to vaginal bleeding, contractions, leaking of fluid and fetal movement were reviewed in detail with the patient. Please refer to After Visit Summary for other counseling recommendations.   Return in about 2 weeks (around 09/26/2021) for return OB. She will need repeat labs ( hx of gastric  bypass) at 34 weeks. Her husband is a [place] citiz[place] dn[place] come to the [place] for the end of her pregnancy.  Imagene Riches, CNM  09/12/2021 4:32 PM    Imagene Riches, CNM  09/12/2021 4:19 PM

## 2021-09-17 ENCOUNTER — Ambulatory Visit: Payer: BC Managed Care – PPO

## 2021-09-19 ENCOUNTER — Inpatient Hospital Stay: Payer: BC Managed Care – PPO | Attending: Oncology

## 2021-09-19 ENCOUNTER — Other Ambulatory Visit: Payer: Self-pay

## 2021-09-19 VITALS — BP 125/69 | HR 76 | Temp 97.9°F | Resp 18 | Wt 184.0 lb

## 2021-09-19 DIAGNOSIS — Z9884 Bariatric surgery status: Secondary | ICD-10-CM | POA: Diagnosis present

## 2021-09-19 DIAGNOSIS — E538 Deficiency of other specified B group vitamins: Secondary | ICD-10-CM | POA: Diagnosis present

## 2021-09-19 DIAGNOSIS — D508 Other iron deficiency anemias: Secondary | ICD-10-CM | POA: Insufficient documentation

## 2021-09-19 MED ORDER — CYANOCOBALAMIN 1000 MCG/ML IJ SOLN
1000.0000 ug | Freq: Once | INTRAMUSCULAR | Status: AC
  Administered 2021-09-19: 1000 ug via INTRAMUSCULAR
  Filled 2021-09-19: qty 1

## 2021-09-19 NOTE — Patient Instructions (Signed)
Vitamin B12 Injection °What is this medication? °Vitamin B12 (VAHY tuh min B12) prevents and treats low vitamin B12 levels in your body. It is used in people who do not get enough vitamin B12 from their diet or when their digestive tract does not absorb enough. Vitamin B12 plays an important role in maintaining the health of your nervous system and red blood cells. °This medicine may be used for other purposes; ask your health care provider or pharmacist if you have questions. °COMMON BRAND NAME(S): B-12 Compliance Kit, B-12 Injection Kit, Cyomin, Dodex, LA-12, Nutri-Twelve, Physicians EZ Use B-12, Primabalt °What should I tell my care team before I take this medication? °They need to know if you have any of these conditions: °Kidney disease °Leber's disease °Megaloblastic anemia °An unusual or allergic reaction to cyanocobalamin, cobalt, other medications, foods, dyes, or preservatives °Pregnant or trying to get pregnant °Breast-feeding °How should I use this medication? °This medication is injected into a muscle or deeply under the skin. It is usually given in a clinic or care team's office. However, your care team may teach you how to inject yourself. Follow all instructions. °Talk to your care team about the use of this medication in children. Special care may be needed. °Overdosage: If you think you have taken too much of this medicine contact a poison control center or emergency room at once. °NOTE: This medicine is only for you. Do not share this medicine with others. °What if I miss a dose? °If you are given your dose at a clinic or care team's office, call to reschedule your appointment. If you give your own injections, and you miss a dose, take it as soon as you can. If it is almost time for your next dose, take only that dose. Do not take double or extra doses. °What may interact with this medication? °Colchicine °Heavy alcohol intake °This list may not describe all possible interactions. Give your health  care provider a list of all the medicines, herbs, non-prescription drugs, or dietary supplements you use. Also tell them if you smoke, drink alcohol, or use illegal drugs. Some items may interact with your medicine. °What should I watch for while using this medication? °Visit your care team regularly. You may need blood work done while you are taking this medication. °You may need to follow a special diet. Talk to your care team. Limit your alcohol intake and avoid smoking to get the best benefit. °What side effects may I notice from receiving this medication? °Side effects that you should report to your care team as soon as possible: °Allergic reactions--skin rash, itching, hives, swelling of the face, lips, tongue, or throat °Swelling of the ankles, hands, or feet °Trouble breathing °Side effects that usually do not require medical attention (report to your care team if they continue or are bothersome): °Diarrhea °This list may not describe all possible side effects. Call your doctor for medical advice about side effects. You may report side effects to FDA at 1-800-FDA-1088. °Where should I keep my medication? °Keep out of the reach of children. °Store at room temperature between 15 and 30 degrees C (59 and 85 degrees F). Protect from light. Throw away any unused medication after the expiration date. °NOTE: This sheet is a summary. It may not cover all possible information. If you have questions about this medicine, talk to your doctor, pharmacist, or health care provider. °© 2022 Elsevier/Gold Standard (2020-11-07 00:00:00) ° °

## 2021-09-26 ENCOUNTER — Ambulatory Visit (INDEPENDENT_AMBULATORY_CARE_PROVIDER_SITE_OTHER): Payer: BC Managed Care – PPO | Admitting: Advanced Practice Midwife

## 2021-09-26 ENCOUNTER — Encounter: Payer: Self-pay | Admitting: Advanced Practice Midwife

## 2021-09-26 ENCOUNTER — Other Ambulatory Visit: Payer: Self-pay

## 2021-09-26 VITALS — BP 100/60 | Wt 187.0 lb

## 2021-09-26 DIAGNOSIS — Z3A32 32 weeks gestation of pregnancy: Secondary | ICD-10-CM

## 2021-09-26 DIAGNOSIS — O99840 Bariatric surgery status complicating pregnancy, unspecified trimester: Secondary | ICD-10-CM

## 2021-09-26 DIAGNOSIS — O0993 Supervision of high risk pregnancy, unspecified, third trimester: Secondary | ICD-10-CM

## 2021-09-26 NOTE — Progress Notes (Signed)
Routine Prenatal Care Visit  Subjective  Wendy Johnston is a 31 y.o. G1P0000 at [redacted]w[redacted]d being seen today for ongoing prenatal care.  She is currently monitored for the following issues for this high-risk pregnancy and has Iron deficiency anemia; Supervision of high risk pregnancy, antepartum; Previous gastric bypass affecting pregnancy, antepartum; Obesity affecting pregnancy; B12 deficiency; and PCOS (polycystic ovarian syndrome) on their problem list.  ----------------------------------------------------------------------------------- Patient reports no complaints.  She has a growth scan with MFM tomorrow.  Contractions: Not present. Vag. Bleeding: None.  Movement: Present. Leaking Fluid denies.  ----------------------------------------------------------------------------------- The following portions of the patient's history were reviewed and updated as appropriate: allergies, current medications, past family history, past medical history, past social history, past surgical history and problem list. Problem list updated.  Objective  Blood pressure 100/60, weight 187 lb (84.8 kg), last menstrual period 02/13/2021. Pregravid weight 160 lb (72.6 kg) Total Weight Gain 27 lb (12.2 kg) Urinalysis: Urine Protein    Urine Glucose    Fetal Status: Fetal Heart Rate (bpm): 122 Fundal Height: 33 cm Movement: Present     General:  Alert, oriented and cooperative. Patient is in no acute distress.  Skin: Skin is warm and dry. No rash noted.   Cardiovascular: Normal heart rate noted  Respiratory: Normal respiratory effort, no problems with respiration noted  Abdomen: Soft, gravid, appropriate for gestational age. Pain/Pressure: Absent     Pelvic:  Cervical exam deferred        Extremities: Normal range of motion.  Edema: None  Mental Status: Normal mood and affect. Normal behavior. Normal judgment and thought content.   Assessment   31 y.o. G1P0000 at [redacted]w[redacted]d by  11/20/2021, by Last Menstrual Period  presenting for routine prenatal visit  Plan   July 2022 Problems (from 03/27/21 to present)    Problem Noted Resolved   Supervision of high risk pregnancy, antepartum 03/27/2021 by Will Bonnet, MD No   Overview Addendum 07/04/2021  3:58 PM by Gae Dry, MD     Nursing Staff Provider  Office Location  [facility] Dating   LMP = 7 wk [place]  Language  English Anatomy US  [facility] nml  Flu Vaccine   Genetic Screen  NIPS: maternT negative- xy. Inheritest neg  TDaP vaccine    Hgb A1C or  GTT Early : Third trimester :   Covid    LAB RESULTS   Rhogam  Not needed Blood Type A/Positive/-- (07/20 1150)   Feeding Plan  Antibody Negative (07/20 1150)  Contraception  Rubella 1.87 (07/20 1150)  Circumcision  RPR Non Reactive (07/20 1150)   Pediatrician   HBsAg Negative (07/20 1150)   Support Person  HIV Non Reactive (07/20 1150)  Prenatal Classes  Discussed Varicella Immune    GBS  (For PCN allergy, check sensitivities)   BTL Consent     VBAC Consent  Pap  2022 NIL    Hgb Electro   n/a    CF neg     SMA neg              Previous gastric bypass affecting pregnancy, antepartum 03/27/2021 by Will Bonnet, MD No   Obesity affecting pregnancy 03/27/2021 by Will Bonnet, MD No       Preterm labor symptoms and general obstetric precautions including but not limited to vaginal bleeding, contractions, leaking of fluid and fetal movement were reviewed in detail with the patient.   Return in about 2 weeks (around 10/10/2021) for rob.  Opal Sidles  Dennard Nip, [place] 09/26/2021 4:18 PM

## 2021-09-27 ENCOUNTER — Ambulatory Visit: Payer: BC Managed Care – PPO | Admitting: *Deleted

## 2021-09-27 ENCOUNTER — Ambulatory Visit: Payer: BC Managed Care – PPO | Attending: Obstetrics and Gynecology

## 2021-09-27 ENCOUNTER — Encounter: Payer: BC Managed Care – PPO | Admitting: Advanced Practice Midwife

## 2021-09-27 VITALS — BP 127/59 | HR 64

## 2021-09-27 DIAGNOSIS — O99840 Bariatric surgery status complicating pregnancy, unspecified trimester: Secondary | ICD-10-CM

## 2021-09-27 DIAGNOSIS — O99843 Bariatric surgery status complicating pregnancy, third trimester: Secondary | ICD-10-CM | POA: Diagnosis not present

## 2021-09-27 DIAGNOSIS — O99213 Obesity complicating pregnancy, third trimester: Secondary | ICD-10-CM | POA: Diagnosis present

## 2021-09-27 DIAGNOSIS — E669 Obesity, unspecified: Secondary | ICD-10-CM | POA: Diagnosis not present

## 2021-09-27 DIAGNOSIS — O0990 Supervision of high risk pregnancy, unspecified, unspecified trimester: Secondary | ICD-10-CM

## 2021-09-27 DIAGNOSIS — Z6831 Body mass index (BMI) 31.0-31.9, adult: Secondary | ICD-10-CM | POA: Diagnosis present

## 2021-09-27 DIAGNOSIS — Z9884 Bariatric surgery status: Secondary | ICD-10-CM | POA: Diagnosis present

## 2021-09-27 DIAGNOSIS — Z3A32 32 weeks gestation of pregnancy: Secondary | ICD-10-CM

## 2021-09-30 ENCOUNTER — Other Ambulatory Visit: Payer: Self-pay | Admitting: *Deleted

## 2021-09-30 DIAGNOSIS — O99843 Bariatric surgery status complicating pregnancy, third trimester: Secondary | ICD-10-CM

## 2021-10-11 ENCOUNTER — Ambulatory Visit (INDEPENDENT_AMBULATORY_CARE_PROVIDER_SITE_OTHER): Payer: BC Managed Care – PPO | Admitting: Obstetrics

## 2021-10-11 ENCOUNTER — Other Ambulatory Visit: Payer: Self-pay

## 2021-10-11 VITALS — BP 124/80 | Wt 194.0 lb

## 2021-10-11 DIAGNOSIS — O0993 Supervision of high risk pregnancy, unspecified, third trimester: Secondary | ICD-10-CM

## 2021-10-11 NOTE — Progress Notes (Signed)
No vb. No lof.  

## 2021-10-11 NOTE — Progress Notes (Signed)
Routine Prenatal Care Visit  Subjective  Wendy Johnston is a 31 y.o. G1P0000 at [redacted]w[redacted]d being seen today for ongoing prenatal care.  She is currently monitored for the following issues for this high-risk pregnancy and has Iron deficiency anemia; Supervision of high risk pregnancy, antepartum; Previous gastric bypass affecting pregnancy, antepartum; Obesity affecting pregnancy; B12 deficiency; and PCOS (polycystic ovarian syndrome) on their problem list.  ----------------------------------------------------------------------------------- Patient reports no complaints.   Contractions: Not present. Vag. Bleeding: None.  Movement: Present. Leaking Fluid denies.  ----------------------------------------------------------------------------------- The following portions of the patient's history were reviewed and updated as appropriate: allergies, current medications, past family history, past medical history, past social history, past surgical history and problem list. Problem list updated.  Objective  Blood pressure 124/80, weight 194 lb (88 kg), last menstrual period 02/13/2021. Pregravid weight 160 lb (72.6 kg) Total Weight Gain 34 lb (15.4 kg) Urinalysis: Urine Protein    Urine Glucose    Fetal Status:     Movement: Present     General:  Alert, oriented and cooperative. Patient is in no acute distress.  Skin: Skin is warm and dry. No rash noted.   Cardiovascular: Normal heart rate noted  Respiratory: Normal respiratory effort, no problems with respiration noted  Abdomen: Soft, gravid, appropriate for gestational age. Pain/Pressure: Absent     Pelvic:  Cervical exam deferred        Extremities: Normal range of motion.     Mental Status: Normal mood and affect. Normal behavior. Normal judgment and thought content.   Assessment   31 y.o. G1P0000 at [redacted]w[redacted]d by  11/20/2021, by Last Menstrual Period presenting for routine prenatal visit  Plan   July 2022 Problems (from 03/27/21 to present)     Problem Noted Resolved   Supervision of high risk pregnancy, antepartum 03/27/2021 by Will Bonnet, MD No   Overview Addendum 07/04/2021  3:58 PM by Gae Dry, MD     Nursing Staff Provider  Office Location  [facility] Dating   LMP = 7 wk [place]  Language  English Anatomy US  [facility] nml  Flu Vaccine   Genetic Screen  NIPS: maternT negative- xy. Inheritest neg  TDaP vaccine    Hgb A1C or  GTT Early : Third trimester :   Covid    LAB RESULTS   Rhogam  Not needed Blood Type A/Positive/-- (07/20 1150)   Feeding Plan  Antibody Negative (07/20 1150)  Contraception  Rubella 1.87 (07/20 1150)  Circumcision  RPR Non Reactive (07/20 1150)   Pediatrician   HBsAg Negative (07/20 1150)   Support Person  HIV Non Reactive (07/20 1150)  Prenatal Classes  Discussed Varicella Immune    GBS  (For PCN allergy, check sensitivities)   BTL Consent     VBAC Consent  Pap  2022 NIL    Hgb Electro   n/a    CF neg     SMA neg              Previous gastric bypass affecting pregnancy, antepartum 03/27/2021 by Will Bonnet, MD No   Obesity affecting pregnancy 03/27/2021 by Will Bonnet, MD No       Preterm labor symptoms and general obstetric precautions including but not limited to vaginal bleeding, contractions, leaking of fluid and fetal movement were reviewed in detail with the patient. Please refer to After Visit Summary for other counseling recommendations.   Return in about 2 weeks (around 10/25/2021) for return OB, GBS, cultures.  Joycelyn Schmid  Lynett Fish, CNM  10/11/2021 4:31 PM

## 2021-10-17 ENCOUNTER — Other Ambulatory Visit: Payer: Self-pay

## 2021-10-17 ENCOUNTER — Inpatient Hospital Stay: Payer: BC Managed Care – PPO | Attending: Oncology

## 2021-10-17 VITALS — BP 135/76 | HR 75 | Temp 97.8°F | Resp 18 | Wt 194.0 lb

## 2021-10-17 DIAGNOSIS — E538 Deficiency of other specified B group vitamins: Secondary | ICD-10-CM | POA: Diagnosis not present

## 2021-10-17 DIAGNOSIS — Z79899 Other long term (current) drug therapy: Secondary | ICD-10-CM | POA: Insufficient documentation

## 2021-10-17 DIAGNOSIS — D508 Other iron deficiency anemias: Secondary | ICD-10-CM

## 2021-10-17 MED ORDER — CYANOCOBALAMIN 1000 MCG/ML IJ SOLN
1000.0000 ug | Freq: Once | INTRAMUSCULAR | Status: AC
  Administered 2021-10-17: 1000 ug via INTRAMUSCULAR
  Filled 2021-10-17: qty 1

## 2021-10-17 NOTE — Patient Instructions (Signed)
Vitamin B12 Injection °What is this medication? °Vitamin B12 (VAHY tuh min B12) prevents and treats low vitamin B12 levels in your body. It is used in people who do not get enough vitamin B12 from their diet or when their digestive tract does not absorb enough. Vitamin B12 plays an important role in maintaining the health of your nervous system and red blood cells. °This medicine may be used for other purposes; ask your health care provider or pharmacist if you have questions. °COMMON BRAND NAME(S): B-12 Compliance Kit, B-12 Injection Kit, Cyomin, Dodex, LA-12, Nutri-Twelve, Physicians EZ Use B-12, Primabalt °What should I tell my care team before I take this medication? °They need to know if you have any of these conditions: °Kidney disease °Leber's disease °Megaloblastic anemia °An unusual or allergic reaction to cyanocobalamin, cobalt, other medications, foods, dyes, or preservatives °Pregnant or trying to get pregnant °Breast-feeding °How should I use this medication? °This medication is injected into a muscle or deeply under the skin. It is usually given in a clinic or care team's office. However, your care team may teach you how to inject yourself. Follow all instructions. °Talk to your care team about the use of this medication in children. Special care may be needed. °Overdosage: If you think you have taken too much of this medicine contact a poison control center or emergency room at once. °NOTE: This medicine is only for you. Do not share this medicine with others. °What if I miss a dose? °If you are given your dose at a clinic or care team's office, call to reschedule your appointment. If you give your own injections, and you miss a dose, take it as soon as you can. If it is almost time for your next dose, take only that dose. Do not take double or extra doses. °What may interact with this medication? °Colchicine °Heavy alcohol intake °This list may not describe all possible interactions. Give your health  care provider a list of all the medicines, herbs, non-prescription drugs, or dietary supplements you use. Also tell them if you smoke, drink alcohol, or use illegal drugs. Some items may interact with your medicine. °What should I watch for while using this medication? °Visit your care team regularly. You may need blood work done while you are taking this medication. °You may need to follow a special diet. Talk to your care team. Limit your alcohol intake and avoid smoking to get the best benefit. °What side effects may I notice from receiving this medication? °Side effects that you should report to your care team as soon as possible: °Allergic reactions--skin rash, itching, hives, swelling of the face, lips, tongue, or throat °Swelling of the ankles, hands, or feet °Trouble breathing °Side effects that usually do not require medical attention (report to your care team if they continue or are bothersome): °Diarrhea °This list may not describe all possible side effects. Call your doctor for medical advice about side effects. You may report side effects to FDA at 1-800-FDA-1088. °Where should I keep my medication? °Keep out of the reach of children. °Store at room temperature between 15 and 30 degrees C (59 and 85 degrees F). Protect from light. Throw away any unused medication after the expiration date. °NOTE: This sheet is a summary. It may not cover all possible information. If you have questions about this medicine, talk to your doctor, pharmacist, or health care provider. °© 2022 Elsevier/Gold Standard (2020-11-07 00:00:00) ° °

## 2021-10-22 ENCOUNTER — Ambulatory Visit (INDEPENDENT_AMBULATORY_CARE_PROVIDER_SITE_OTHER): Payer: BC Managed Care – PPO | Admitting: Licensed Practical Nurse

## 2021-10-22 ENCOUNTER — Other Ambulatory Visit: Payer: Self-pay

## 2021-10-22 ENCOUNTER — Encounter: Payer: Self-pay | Admitting: Licensed Practical Nurse

## 2021-10-22 VITALS — BP 120/64 | Wt 194.0 lb

## 2021-10-22 DIAGNOSIS — F32A Depression, unspecified: Secondary | ICD-10-CM

## 2021-10-22 DIAGNOSIS — O99343 Other mental disorders complicating pregnancy, third trimester: Secondary | ICD-10-CM

## 2021-10-22 DIAGNOSIS — O0993 Supervision of high risk pregnancy, unspecified, third trimester: Secondary | ICD-10-CM

## 2021-10-22 DIAGNOSIS — Z3400 Encounter for supervision of normal first pregnancy, unspecified trimester: Secondary | ICD-10-CM

## 2021-10-22 DIAGNOSIS — Z3A36 36 weeks gestation of pregnancy: Secondary | ICD-10-CM

## 2021-10-22 DIAGNOSIS — Z3A35 35 weeks gestation of pregnancy: Secondary | ICD-10-CM

## 2021-10-22 LAB — POCT URINALYSIS DIPSTICK OB
Glucose, UA: NEGATIVE
POC,PROTEIN,UA: NEGATIVE

## 2021-10-22 MED ORDER — SERTRALINE HCL 25 MG PO TABS
ORAL_TABLET | ORAL | 2 refills | Status: DC
Start: 2021-10-22 — End: 2022-08-07

## 2021-10-22 NOTE — Progress Notes (Signed)
Routine Prenatal Care Visit  Subjective  Wendy Johnston is a 31 y.o. G1P0000 at [redacted]w[redacted]d being seen today for ongoing prenatal care.  She is currently monitored for the following issues for this low-risk pregnancy and has Iron deficiency anemia; Supervision of high risk pregnancy, antepartum; Previous gastric bypass affecting pregnancy, antepartum; Obesity affecting pregnancy; B12 deficiency; and PCOS (polycystic ovarian syndrome) on their problem list.  ----------------------------------------------------------------------------------- Patient reports express concern for PPD, has a hx of depression feels like she is starting ot have symptoms-gets upset easily, especially if something is out ofd control. Worries that this can spiral during PP. Has spoken with her partner and created  a plan.  Is open to medication if needed, may consider starting Zoloft prior to birth.  -ready for baby, reviewed signs of labor.  Contractions: Not present. Vag. Bleeding: None.  Movement: Present. Leaking Fluid denies.  ----------------------------------------------------------------------------------- The following portions of the patient's history were reviewed and updated as appropriate: allergies, current medications, past family history, past medical history, past social history, past surgical history and problem list. Problem list updated.  Objective  Blood pressure 120/64, weight 194 lb (88 kg), last menstrual period 02/13/2021. Pregravid weight 160 lb (72.6 kg) Total Weight Gain 34 lb (15.4 kg) Urinalysis: Urine Protein Negative  Urine Glucose Negative  Fetal Status: Fetal Heart Rate (bpm): 130 Fundal Height: 36 cm Movement: Present  Presentation: Vertex  General:  Alert, oriented and cooperative. Patient is in no acute distress.  Skin: Skin is warm and dry. No rash noted.   Cardiovascular: Normal heart rate noted  Respiratory: Normal respiratory effort, no problems with respiration noted  Abdomen:  Soft, gravid, appropriate for gestational age. Pain/Pressure: Present     Pelvic:  Cervical exam deferred        Extremities: Normal range of motion.  Edema: None  Mental Status: Normal mood and affect. Normal behavior. Normal judgment and thought content.   Assessment   31 y.o. G1P0000 at [redacted]w[redacted]d by  11/20/2021, by Last Menstrual Period presenting for routine prenatal visit  Plan   July 2022 Problems (from 03/27/21 to present)     Problem Noted Resolved   Supervision of high risk pregnancy, antepartum 03/27/2021 by Will Bonnet, MD No   Overview Addendum 07/04/2021  3:58 PM by Gae Dry, MD     Nursing Staff Provider  Office Location  [facility] Dating   LMP = 7 wk [place]  Language  English Anatomy US  [facility] nml  Flu Vaccine   Genetic Screen  NIPS: maternT negative- xy. Inheritest neg  TDaP vaccine    Hgb A1C or  GTT Early : Third trimester :   Covid    LAB RESULTS   Rhogam  Not needed Blood Type A/Positive/-- (07/20 1150)   Feeding Plan  Antibody Negative (07/20 1150)  Contraception  Rubella 1.87 (07/20 1150)  Circumcision  RPR Non Reactive (07/20 1150)   Pediatrician   HBsAg Negative (07/20 1150)   Support Person  HIV Non Reactive (07/20 1150)  Prenatal Classes  Discussed Varicella Immune    GBS  (For PCN allergy, check sensitivities)   BTL Consent     VBAC Consent  Pap  2022 NIL    Hgb Electro   n/a    CF neg     SMA neg              Previous gastric bypass affecting pregnancy, antepartum 03/27/2021 by Will Bonnet, MD No   Obesity affecting pregnancy 03/27/2021  by Will Bonnet, MD No        Preterm labor symptoms and general obstetric precautions including but not limited to vaginal bleeding, contractions, leaking of fluid and fetal movement were reviewed in detail with the patient. Please refer to After Visit Summary for other counseling recommendations.   Return in about 1 week (around 10/29/2021) for ROB.  HAS FU [place] 2/17  Gastric bypass and  36 wk labs collected   Script for Zoloft sent, may start now or closer to your due date, or at the time of birth.   Roberto Scales, CNM  Mosetta Pigeon, [facility]  10/22/21  4:51 PM

## 2021-10-23 LAB — FOLATE: Folate: 3.9 ng/mL

## 2021-10-23 LAB — IRON,TIBC AND FERRITIN PANEL
Ferritin: 18 ng/mL (ref 15–150)
Iron Saturation: 17 % (ref 15–55)
Iron: 84 ug/dL (ref 27–159)
Total Iron Binding Capacity: 498 ug/dL — ABNORMAL HIGH (ref 250–450)
UIBC: 414 ug/dL (ref 131–425)

## 2021-10-23 LAB — CBC WITH DIFFERENTIAL/PLATELET
Basophils Absolute: 0 x10E3/uL (ref 0.0–0.2)
Basos: 1 %
EOS (ABSOLUTE): 0 x10E3/uL (ref 0.0–0.4)
Eos: 1 %
Hematocrit: 38.5 % (ref 34.0–46.6)
Hemoglobin: 12.8 g/dL (ref 11.1–15.9)
Immature Grans (Abs): 0.1 x10E3/uL (ref 0.0–0.1)
Immature Granulocytes: 1 %
Lymphocytes Absolute: 1.5 x10E3/uL (ref 0.7–3.1)
Lymphs: 19 %
MCH: 27.5 pg (ref 26.6–33.0)
MCHC: 33.2 g/dL (ref 31.5–35.7)
MCV: 83 fL (ref 79–97)
Monocytes Absolute: 0.7 x10E3/uL (ref 0.1–0.9)
Monocytes: 8 %
Neutrophils Absolute: 5.5 x10E3/uL (ref 1.4–7.0)
Neutrophils: 70 %
Platelets: 217 x10E3/uL (ref 150–450)
RBC: 4.65 x10E6/uL (ref 3.77–5.28)
RDW: 13 % (ref 11.7–15.4)
WBC: 7.8 x10E3/uL (ref 3.4–10.8)

## 2021-10-23 LAB — VITAMIN B12: Vitamin B-12: 269 pg/mL (ref 232–1245)

## 2021-10-25 ENCOUNTER — Other Ambulatory Visit: Payer: Self-pay

## 2021-10-25 ENCOUNTER — Ambulatory Visit: Payer: BC Managed Care – PPO | Attending: Obstetrics and Gynecology

## 2021-10-25 ENCOUNTER — Ambulatory Visit: Payer: BC Managed Care – PPO | Admitting: *Deleted

## 2021-10-25 VITALS — BP 135/63 | HR 66

## 2021-10-25 DIAGNOSIS — O99213 Obesity complicating pregnancy, third trimester: Secondary | ICD-10-CM | POA: Diagnosis not present

## 2021-10-25 DIAGNOSIS — O99843 Bariatric surgery status complicating pregnancy, third trimester: Secondary | ICD-10-CM | POA: Diagnosis not present

## 2021-10-25 DIAGNOSIS — O0990 Supervision of high risk pregnancy, unspecified, unspecified trimester: Secondary | ICD-10-CM | POA: Diagnosis present

## 2021-10-25 DIAGNOSIS — Z3A36 36 weeks gestation of pregnancy: Secondary | ICD-10-CM | POA: Diagnosis not present

## 2021-10-25 DIAGNOSIS — E669 Obesity, unspecified: Secondary | ICD-10-CM | POA: Diagnosis not present

## 2021-10-25 DIAGNOSIS — O99840 Bariatric surgery status complicating pregnancy, unspecified trimester: Secondary | ICD-10-CM

## 2021-10-26 LAB — CULTURE, BETA STREP (GROUP B ONLY): Strep Gp B Culture: NEGATIVE

## 2021-10-28 NOTE — Progress Notes (Signed)
[facility]  [address] [place],  [place]  [address] [phone]  Clinic Day:  11/01/2021  Referring physician: Philmore Pali, NP  This document serves as a record of services personally performed by Marice Potter, MD. It was created on their behalf by Curry,Lauren E, a trained medical scribe. The creation of this record is based on the scribe's personal observations and the provider's statements to them.  HISTORY OF PRESENT ILLNESS:  The patient is a 31 y.o. female  with iron deficiency anemia.  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  She also had gastric bypass surgery a few years ago.   She comes in today to reassess her labs after recently receiving IV iron.  She has definitely felt better since receiving her IV iron formulation.  She denies having increased fatigue or any overt forms of blood loss.  Of note, the patient is now 37-weeks pregnant.   PHYSICAL EXAM:  Blood pressure (!) 142/82, pulse 67, temperature 97.8 F (36.6 C), temperature source Oral, resp. rate 18, height 5' 1.75" (1.568 m), weight 197 lb (89.4 kg), last menstrual period 02/13/2021, SpO2 98 %. Wt Readings from Last 3 Encounters:  11/01/21 197 lb (89.4 kg)  10/29/21 197 lb (89.4 kg)  10/22/21 194 lb (88 kg)   Body mass index is 36.32 kg/m. Performance status (ECOG): 0 - Asymptomatic Physical Exam Constitutional:      Appearance: Normal appearance. She is not ill-appearing.  HENT:     Mouth/Throat:     Mouth: Mucous membranes are moist.     Pharynx: Oropharynx is clear. No oropharyngeal exudate or posterior oropharyngeal erythema.  Cardiovascular:     Rate and Rhythm: Normal rate and regular rhythm.     Heart sounds: No murmur heard.   No friction rub. No gallop.  Pulmonary:     Effort: Pulmonary effort is normal. No respiratory distress.     Breath sounds: Normal breath sounds. No wheezing, rhonchi  or rales.  Abdominal:     General: Bowel sounds are normal. There is no distension.     Palpations: Abdomen is soft. There is no mass.     Tenderness: There is no abdominal tenderness.  Musculoskeletal:        General: No swelling.     Right lower leg: No edema.     Left lower leg: No edema.  Lymphadenopathy:     Cervical: No cervical adenopathy.     Upper Body:     Right upper body: No supraclavicular or axillary adenopathy.     Left upper body: No supraclavicular or axillary adenopathy.     Lower Body: No right inguinal adenopathy. No left inguinal adenopathy.  Skin:    General: Skin is warm.     Coloration: Skin is not jaundiced.     Findings: No lesion or rash.  Neurological:     General: No focal deficit present.     Mental Status: She is alert and oriented to person, place, and time. Mental status is at baseline.  Psychiatric:        Mood and Affect: Mood normal.        Behavior: Behavior normal.        Thought Content: Thought content normal.   LABS:     Latest Reference Range & Units 11/01/21 16:11  Iron 28 - 170 ug/dL 77  UIBC ug/dL 523  TIBC 250 - 450 ug/dL  600 (H)  Saturation Ratios 10.4 - 31.8 % 13  Ferritin 11 - 307 ng/mL 8 (L)  (H): Data is abnormally high (L): Data is abnormally low  ASSESSMENT & PLAN:  A 31 y.o. female with iron deficiency anemia, likely related to her previous gastric bypass surgery.  The patient is also 37-weeks pregnant.  Her labs today show that both her hemoglobin and MCV have improved.  However, her iron parameters remain very suboptimal, likely due to the ongoing demands of her full-term pregnancy.  After she delivers her baby, I will have her receive another course of IV iron to replenish her iron stores.  Otherwise, I will see this patient back in 4 months to reassess her iron parameters.  The patient understands all the plans discussed today and is in agreement with them.    I, Rita Ohara, am acting as scribe for Marice Potter, MD    I have reviewed this report as typed by the medical scribe, and it is complete and accurate.  Lakenya Riendeau Macarthur Critchley, MD

## 2021-10-29 ENCOUNTER — Other Ambulatory Visit: Payer: Self-pay

## 2021-10-29 ENCOUNTER — Ambulatory Visit (INDEPENDENT_AMBULATORY_CARE_PROVIDER_SITE_OTHER): Payer: BC Managed Care – PPO | Admitting: Obstetrics

## 2021-10-29 VITALS — BP 120/80 | Wt 197.0 lb

## 2021-10-29 DIAGNOSIS — Z3A36 36 weeks gestation of pregnancy: Secondary | ICD-10-CM

## 2021-10-29 DIAGNOSIS — Z3400 Encounter for supervision of normal first pregnancy, unspecified trimester: Secondary | ICD-10-CM

## 2021-10-29 LAB — POCT URINALYSIS DIPSTICK OB
Glucose, UA: NEGATIVE
POC,PROTEIN,UA: NEGATIVE

## 2021-10-29 NOTE — Progress Notes (Signed)
Routine Prenatal Care Visit  Subjective  Wendy Johnston is a 31 y.o. G1P0000 at [redacted]w[redacted]d being seen today for ongoing prenatal care.  She is currently monitored for the following issues for this low-risk pregnancy and has Iron deficiency anemia; Supervision of high risk pregnancy, antepartum; Previous gastric bypass affecting pregnancy, antepartum; Obesity affecting pregnancy; B12 deficiency; and PCOS (polycystic ovarian syndrome) on their problem list.  ----------------------------------------------------------------------------------- Patient reports she has not started on the zoloft that lydia prescirbed , but she is having some mood "irritability" and is considering initiating the RX.   Contractions: Not present. Vag. Bleeding: None.  Movement: Present. Leaking Fluid denies.  ----------------------------------------------------------------------------------- The following portions of the patient's history were reviewed and updated as appropriate: allergies, current medications, past family history, past medical history, past social history, past surgical history and problem list. Problem list updated.  Objective  Blood pressure 120/80, weight 197 lb (89.4 kg), last menstrual period 02/13/2021. Pregravid weight 160 lb (72.6 kg) Total Weight Gain 37 lb (16.8 kg) Urinalysis: Urine Protein    Urine Glucose    Fetal Status:     Movement: Present     General:  Alert, oriented and cooperative. Patient is in no acute distress.  Skin: Skin is warm and dry. No rash noted.   Cardiovascular: Normal heart rate noted  Respiratory: Normal respiratory effort, no problems with respiration noted  Abdomen: Soft, gravid, appropriate for gestational age. Pain/Pressure: Absent     Pelvic:  Cervical exam deferred        Extremities: Normal range of motion.     Mental Status: Normal mood and affect. Normal behavior. Normal judgment and thought content.   Assessment   31 y.o. G1P0000 at [redacted]w[redacted]d by   11/20/2021, by Last Menstrual Period presenting for routine prenatal visit  Plan   July 2022 Problems (from 03/27/21 to present)    Problem Noted Resolved   Supervision of high risk pregnancy, antepartum 03/27/2021 by Will Bonnet, MD No   Overview Addendum 10/28/2021  5:45 PM by Imagene Riches, CNM     Nursing Staff Provider  Office Location  [facility] Dating   LMP = 7 wk [place]  Language  English Anatomy US  [facility] nml  Flu Vaccine   Genetic Screen  NIPS: maternT negative- xy. Inheritest neg  TDaP vaccine    Hgb A1C or  GTT Early : Third trimester :   Covid    LAB RESULTS   Rhogam  Not needed Blood Type A/Positive/-- (07/20 1150)   Feeding Plan  Antibody Negative (07/20 1150)  Contraception  Rubella 1.87 (07/20 1150)  Circumcision  RPR Non Reactive (07/20 1150)   Pediatrician   HBsAg Negative (07/20 1150)   Support Person  HIV Non Reactive (07/20 1150)  Prenatal Classes  Discussed Varicella Immune    GBS  (For PCN allergy, check sensitivities) negative  BTL Consent     VBAC Consent  Pap  2022 NIL    Hgb Electro   n/a    CF neg     SMA neg              Previous gastric bypass affecting pregnancy, antepartum 03/27/2021 by Will Bonnet, MD No   Obesity affecting pregnancy 03/27/2021 by Will Bonnet, MD No       Term labor symptoms and general obstetric precautions including but not limited to vaginal bleeding, contractions, leaking of fluid and fetal movement were reviewed in detail with the patient. Please refer to After  Visit Summary for other counseling recommendations.  GBS and cultures today. Additional time spent discussing her mood- she is " Type A" and struggles with not being able to control things.Encouraged to begin taking Sertralin e  In the evenings and daily. aslo suggested a SAD lamp for light therapy.  Return in about 1 week (around 11/05/2021) for return OB.  Imagene Riches, CNM  10/29/2021 4:05 PM

## 2021-10-31 ENCOUNTER — Telehealth: Payer: Self-pay

## 2021-10-31 NOTE — Telephone Encounter (Signed)
Josie from [facility] calling to see if we recv'd rx for compression socks and preg support belt; it was faxed on the 20th.  Any questions [phone]

## 2021-11-01 ENCOUNTER — Encounter: Payer: Self-pay | Admitting: Oncology

## 2021-11-01 ENCOUNTER — Other Ambulatory Visit: Payer: Self-pay

## 2021-11-01 ENCOUNTER — Inpatient Hospital Stay: Payer: BC Managed Care – PPO

## 2021-11-01 ENCOUNTER — Inpatient Hospital Stay (INDEPENDENT_AMBULATORY_CARE_PROVIDER_SITE_OTHER): Payer: BC Managed Care – PPO | Admitting: Oncology

## 2021-11-01 VITALS — BP 142/82 | HR 67 | Temp 97.8°F | Resp 18 | Ht 61.75 in | Wt 197.0 lb

## 2021-11-01 DIAGNOSIS — D508 Other iron deficiency anemias: Secondary | ICD-10-CM

## 2021-11-01 DIAGNOSIS — E538 Deficiency of other specified B group vitamins: Secondary | ICD-10-CM | POA: Diagnosis not present

## 2021-11-01 LAB — CBC AND DIFFERENTIAL
HCT: 39 (ref 36–46)
Hemoglobin: 13.1 (ref 12.0–16.0)
Neutrophils Absolute: 6.96
Platelets: 215 (ref 150–399)
WBC: 9.4

## 2021-11-01 LAB — FERRITIN: Ferritin: 8 ng/mL — ABNORMAL LOW (ref 11–307)

## 2021-11-01 LAB — IRON AND TIBC
Iron: 77 ug/dL (ref 28–170)
Saturation Ratios: 13 % (ref 10.4–31.8)
TIBC: 600 ug/dL — ABNORMAL HIGH (ref 250–450)
UIBC: 523 ug/dL

## 2021-11-01 LAB — CBC: RBC: 4.73 (ref 3.87–5.11)

## 2021-11-01 NOTE — Telephone Encounter (Signed)
Yes [place] signed today and I just faxed.

## 2021-11-05 ENCOUNTER — Telehealth: Payer: Self-pay

## 2021-11-05 ENCOUNTER — Encounter: Payer: Self-pay | Admitting: Oncology

## 2021-11-05 NOTE — Telephone Encounter (Signed)
Josie from [facility] calling to see if we recv'd compression socks rx.  [phone]  Called, spoke c Raquel Sarna who states they did not receive rx for the compression socks; they only recv'd rx for the belly band.  Can it be refaxed?

## 2021-11-06 ENCOUNTER — Other Ambulatory Visit: Payer: Self-pay

## 2021-11-06 ENCOUNTER — Ambulatory Visit (INDEPENDENT_AMBULATORY_CARE_PROVIDER_SITE_OTHER): Payer: BC Managed Care – PPO | Admitting: Licensed Practical Nurse

## 2021-11-06 ENCOUNTER — Encounter: Payer: Self-pay | Admitting: Licensed Practical Nurse

## 2021-11-06 VITALS — BP 118/70 | Wt 200.0 lb

## 2021-11-06 DIAGNOSIS — Z3400 Encounter for supervision of normal first pregnancy, unspecified trimester: Secondary | ICD-10-CM

## 2021-11-06 DIAGNOSIS — Z3A38 38 weeks gestation of pregnancy: Secondary | ICD-10-CM

## 2021-11-06 NOTE — Progress Notes (Signed)
Routine Prenatal Care Visit ? ?Subjective  ?Wendy Johnston is a 31 y.o. G1P0000 at [redacted]w[redacted]d being seen today for ongoing prenatal care.  She is currently monitored for the following issues for this low-risk pregnancy and has Iron deficiency anemia; Supervision of high risk pregnancy, antepartum; Previous gastric bypass affecting pregnancy, antepartum; Obesity affecting pregnancy; B12 deficiency; and PCOS (polycystic ovarian syndrome) on their problem list.  ?----------------------------------------------------------------------------------- ?Patient reports no complaints.  Here with partner.  Started Zoloft, mood is fine. Reviewed what to expect in labor, what to do if her water breaks etc.  ?BP was elevated during visit with hematologist on 2/24, BP in our office all normal.  ?Trinidy concerned about her past experience with symptoms of iron deficiency anemia-her biggest worry is is going home PP feeling really bad- reassured that we will check her CBC after birth, would treat based on labs and symptoms.    ?Contractions: Irregular. Vag. Bleeding: None.  Movement: Present. Leaking Fluid denies.  ?----------------------------------------------------------------------------------- ?The following portions of the patient's history were reviewed and updated as appropriate: allergies, current medications, past family history, past medical history, past social history, past surgical history and problem list. Problem list updated. ? ?Objective  ?Blood pressure 118/70, weight 200 lb (90.7 kg), last menstrual period 02/13/2021. ?Pregravid weight 160 lb (72.6 kg) Total Weight Gain 40 lb (18.1 kg) ?Urinalysis: Urine Protein    Urine Glucose   ? ?Fetal Status: Fetal Heart Rate (bpm): 130 Fundal Height: 39 cm Movement: Present    ? ?General:  Alert, oriented and cooperative. Patient is in no acute distress.  ?Skin: Skin is warm and dry. No rash noted.   ?Cardiovascular: Normal heart rate noted  ?Respiratory: Normal  respiratory effort, no problems with respiration noted  ?Abdomen: Soft, gravid, appropriate for gestational age. Pain/Pressure: Present     ?Pelvic:  Cervical exam deferred        ?Extremities: Normal range of motion.  Edema: Trace  ?Mental Status: Normal mood and affect. Normal behavior. Normal judgment and thought content.  ? ?Assessment  ? ?31 y.o. G1P0000 at [redacted]w[redacted]d by  11/20/2021, by Last Menstrual Period presenting for routine prenatal visit ? ?Plan  ? ?July 2022 Problems (from 03/27/21 to present)   ? ? Problem Noted Resolved  ? Supervision of high risk pregnancy, antepartum 03/27/2021 by Will Bonnet, MD No  ? Overview Addendum 10/28/2021  5:45 PM by Imagene Riches, CNM  ?   ?Nursing Staff Provider  ?Office Location  [facility] Dating   LMP = 7 wk [place]  ?Language  Media planner [place]  [facility] nml  ?Flu Vaccine   Genetic Screen  NIPS: maternT negative- xy. Inheritest neg  ?TDaP vaccine    Hgb A1C or  ?GTT Early : ?Third trimester :   ?Covid    LAB RESULTS   ?Rhogam  Not needed Blood Type A/Positive/-- (07/20 1150)   ?Feeding Plan  Antibody Negative (07/20 1150)  ?Contraception  Rubella 1.87 (07/20 1150)  ?Circumcision  RPR Non Reactive (07/20 1150)   ?Pediatrician   HBsAg Negative (07/20 1150)   ?Support Person  HIV Non Reactive (07/20 1150)  ?Prenatal Classes  Discussed Varicella Immune  ?  GBS  (For PCN allergy, check sensitivities) negative  ?BTL Consent     ?VBAC Consent  Pap  2022 NIL  ?  Hgb Electro   n/a  ?  CF neg  ?   SMA neg  ?     ? ? ?  ?  ?  Previous gastric bypass affecting pregnancy, antepartum 03/27/2021 by Will Bonnet, MD No  ? Obesity affecting pregnancy 03/27/2021 by Will Bonnet, MD No  ? ?  ?  ? ?Term labor symptoms and general obstetric precautions including but not limited to vaginal bleeding, contractions, leaking of fluid and fetal movement were reviewed in detail with the patient. ?Please refer to After Visit Summary for other counseling recommendations.  ? ?Return in about 1  week (around 11/13/2021) for [place]. ? ?Roberto Scales, CNM  ?[facility] Pigeon, [facility]  ?11/06/21  ?4:50 PM  ? ? ?

## 2021-11-06 NOTE — Telephone Encounter (Signed)
Not sure can you call and have them send another one

## 2021-11-06 NOTE — Telephone Encounter (Signed)
Wendy Johnston to fax another rx to Jp's attention. ?

## 2021-11-11 ENCOUNTER — Other Ambulatory Visit: Payer: Self-pay | Admitting: Obstetrics & Gynecology

## 2021-11-12 ENCOUNTER — Encounter: Payer: BC Managed Care – PPO | Admitting: Obstetrics

## 2021-11-13 ENCOUNTER — Ambulatory Visit (INDEPENDENT_AMBULATORY_CARE_PROVIDER_SITE_OTHER): Payer: BC Managed Care – PPO | Admitting: Obstetrics and Gynecology

## 2021-11-13 ENCOUNTER — Encounter: Payer: Self-pay | Admitting: Obstetrics and Gynecology

## 2021-11-13 ENCOUNTER — Other Ambulatory Visit: Payer: Self-pay

## 2021-11-13 VITALS — BP 120/74 | Wt 201.0 lb

## 2021-11-13 DIAGNOSIS — O0990 Supervision of high risk pregnancy, unspecified, unspecified trimester: Secondary | ICD-10-CM

## 2021-11-13 DIAGNOSIS — O99213 Obesity complicating pregnancy, third trimester: Secondary | ICD-10-CM

## 2021-11-13 DIAGNOSIS — Z3A39 39 weeks gestation of pregnancy: Secondary | ICD-10-CM

## 2021-11-13 LAB — POCT URINALYSIS DIPSTICK OB
Glucose, UA: NEGATIVE
POC,PROTEIN,UA: NEGATIVE

## 2021-11-13 NOTE — Progress Notes (Signed)
? ?  PRENATAL VISIT NOTE ? ?Subjective:  ?Wendy Johnston is a 31 y.o. G1P0000 at 42w0dbeing seen today for ongoing prenatal care.  She is currently monitored for the following issues for this low-risk pregnancy and has Iron deficiency anemia; Supervision of high risk pregnancy, antepartum; Previous gastric bypass affecting pregnancy, antepartum; Obesity affecting pregnancy; B12 deficiency; and PCOS (polycystic ovarian syndrome) on their problem list. ? ?Patient reports no complaints.  Contractions: Not present. Vag. Bleeding: None.  Movement: Present. Denies leaking of fluid.  ? ?The following portions of the patient's history were reviewed and updated as appropriate: allergies, current medications, past family history, past medical history, past social history, past surgical history and problem list.  ? ?Objective:  ? ?Vitals:  ? 11/13/21 1531  ?BP: 120/74  ?Weight: 201 lb (91.2 kg)  ? ? ?Fetal Status: Fetal Heart Rate (bpm): 121 Fundal Height: 39 cm Movement: Present  Presentation: Vertex ? ?General:  Alert, oriented and cooperative. Patient is in no acute distress.  ?Skin: Skin is warm and dry. No rash noted.   ?Cardiovascular: Normal heart rate noted  ?Respiratory: Normal respiratory effort, no problems with respiration noted  ?Abdomen: Soft, gravid, appropriate for gestational age.  Pain/Pressure: Present     ?Pelvic: Cervical exam performed in the presence of a chaperone Dilation: 2 Effacement (%): 50 Station: -3  ?Extremities: Normal range of motion.  Edema: Trace  ?Mental Status: Normal mood and affect. Normal behavior. Normal judgment and thought content.  ? ?Assessment and Plan:  ?Pregnancy: G1P0000 at 34w0d1. Supervision of high risk pregnancy, antepartum ?Patient is doing well without complaints ?Will plan for IOL at 41 weeks if no labor ? ?2. Obesity affecting pregnancy in third trimester ? ? ?Term labor symptoms and general obstetric precautions including but not limited to vaginal bleeding,  contractions, leaking of fluid and fetal movement were reviewed in detail with the patient. ?Please refer to After Visit Summary for other counseling recommendations.  ? ?Return in about 1 week (around 11/20/2021) for in person, ROB, Low risk. ? ?Future Appointments  ?Date Time Provider [place]?11/19/2021  3:15 PM Dominic, LyNunzio CobbsCNM [facility] None  ? ? ?PeMora BellmanMD ? ?

## 2021-11-19 ENCOUNTER — Ambulatory Visit (INDEPENDENT_AMBULATORY_CARE_PROVIDER_SITE_OTHER): Payer: BC Managed Care – PPO | Admitting: Licensed Practical Nurse

## 2021-11-19 ENCOUNTER — Other Ambulatory Visit: Payer: Self-pay

## 2021-11-19 VITALS — BP 128/82 | Wt 199.0 lb

## 2021-11-19 DIAGNOSIS — Z3A39 39 weeks gestation of pregnancy: Secondary | ICD-10-CM

## 2021-11-19 DIAGNOSIS — O0990 Supervision of high risk pregnancy, unspecified, unspecified trimester: Secondary | ICD-10-CM

## 2021-11-19 DIAGNOSIS — Z1152 Encounter for screening for COVID-19: Secondary | ICD-10-CM

## 2021-11-19 LAB — POCT URINALYSIS DIPSTICK OB
Glucose, UA: NEGATIVE
POC,PROTEIN,UA: NEGATIVE

## 2021-11-19 NOTE — Progress Notes (Signed)
Routine Prenatal Care Visit ? ?Subjective  ?Wendy Johnston is a 31 y.o. G1P0000 at 77w6dbeing seen today for ongoing prenatal care.  She is currently monitored for the following issues for this low-risk pregnancy and has Iron deficiency anemia; Supervision of high risk pregnancy, antepartum; Previous gastric bypass affecting pregnancy, antepartum; Obesity affecting pregnancy; B12 deficiency; and PCOS (polycystic ovarian syndrome) on their problem list.  ?----------------------------------------------------------------------------------- ?Patient reports no complaints.  Here with partner.  Had a run of contractions after a sweep last visit. Feel ready for baby. Desires sweep today. ?Discussed recommendation for IOL,  accepts IOL at 41 weeks.  ?Contractions: Irritability. Vag. Bleeding: None.  Movement: Present. Leaking Fluid denies.  ?----------------------------------------------------------------------------------- ?The following portions of the patient's history were reviewed and updated as appropriate: allergies, current medications, past family history, past medical history, past social history, past surgical history and problem list. Problem list updated. ? ?Objective  ?Blood pressure 128/82, weight 199 lb (90.3 kg), last menstrual period 02/13/2021. ?Pregravid weight 160 lb (72.6 kg) Total Weight Gain 39 lb (17.7 kg) ?Urinalysis: Urine Protein    Urine Glucose   ? ?Fetal Status: Fetal Heart Rate (bpm): 120 Fundal Height: 41 cm Movement: Present  Presentation: Vertex ? ?General:  Alert, oriented and cooperative. Patient is in no acute distress.  ?Skin: Skin is warm and dry. No rash noted.   ?Cardiovascular: Normal heart rate noted  ?Respiratory: Normal respiratory effort, no problems with respiration noted  ?Abdomen: Soft, gravid, appropriate for gestational age. Pain/Pressure: Present     ?Pelvic:  Cervical exam performed Dilation: 2.5 Effacement (%): 50 Station: -2  ?Extremities: Normal range of  motion.  Edema: Trace  ?Mental Status: Normal mood and affect. Normal behavior. Normal judgment and thought content.  ? ?Assessment  ? ?31y.o. G1P0000 at 350w6dy  11/20/2021, by Last Menstrual Period presenting for routine prenatal visit ? ?Plan  ? ?July 2022 Problems (from 03/27/21 to present)   ? ? Problem Noted Resolved  ? Supervision of high risk pregnancy, antepartum 03/27/2021 by JaWill BonnetMD No  ? Overview Addendum 11/13/2021  3:36 PM by CoMora BellmanMD  ?   ?Nursing Staff Provider  ?Office Location  [facility] Dating   LMP = 7 wk [place]?Language  [facility] n[place]  ?Flu Vaccine   Genetic Screen  NIPS: maternT negative- xy. Inheritest neg  ?TDaP vaccine    Hgb A1C or  ?GTT Early : ?Third trimester :   ?Covid    LAB RESULTS   ?Rhogam  Not needed Blood Type A/Positive/-- (07/20 1150)   ?Feeding Plan breast Antibody Negative (07/20 1150)  ?Contraception condoms Rubella 1.87 (07/20 1150)  ?Circumcision  RPR Non Reactive (12/21 1046)   ?Pediatrician   HBsAg Negative (12/21 1046)   ?Support Person  HIV Non Reactive (12/21 1046)  ?Prenatal Classes  Discussed Varicella Immune  ?  GBS Negative/-- (02/14 1647)(For PCN allergy, check sensitivities) negative  ?BTL Consent     ?VBAC Consent  Pap  2022 NIL  ?  Hgb Electro   n/a  ?  CF neg  ?   SMA neg  ?     ? ? ?  ?  ? Previous gastric bypass affecting pregnancy, antepartum 03/27/2021 by JaWill BonnetMD No  ? Obesity affecting pregnancy 03/27/2021 by JaWill BonnetMD No  ? ?  ?  ? ?Term labor symptoms and general obstetric precautions including but not limited to vaginal bleeding, contractions, leaking  of fluid and fetal movement were reviewed in detail with the patient. ?Please refer to After Visit Summary for other counseling recommendations.  ? ?IOL 11/27/21 at 0800, COVID test ordered, pt will go on 3/21 ?H and P complete ? ?Roberto Scales, CNM  ?[facility] Pigeon, [facility]  ?11/19/21  ?3:42 PM  ? ? ?

## 2021-11-19 NOTE — H&P (Signed)
OB History & Physical  ? ?History of Present Illness:  ?Chief Complaint:  ? ?HPI:  ?Wendy Johnston is a 31 y.o. G1P0000 female at 51w6ddated by L and 7wkUS.  She presents to L&D for a postdates induction of labor.  She had early and regular prenatal care. She used Letrozole to conceive.  ? ?+FM, occasional  CTX, no LOF, no VB ? ?Pregnancy Issues: ?1. Hx gastric bypass surgery ?2. Hx of iron deficiency anemia requiring iron transfusions  ?3. Hx of anxiety and depression  ?Maternal Medical History:  ? ?Past Medical History:  ?Diagnosis Date  ? Anemia   ? Anxiety   ? Depression   ? Uterine adenomyoma   ? JUVENILE CYSTIC  ? ? ?Past Surgical History:  ?Procedure Laterality Date  ? GASTRIC ROUX-EN-Y    ? LAPAROSCOPIC GASTRIC BYPASS  02/24/2017  ? WISDOM TOOTH EXTRACTION    ? ? ?Not on File ? ?Prior to Admission medications   ?Medication Sig Start Date End Date Taking? Authorizing Provider  ?Prenatal MV & Min w/FA-DHA (PRENATAL GUMMIES PO) Take by mouth.   Yes [provider]  ?sertraline (ZOLOFT) 25 MG tablet Take 1 tablet (25 mg total) by mouth daily for 7 days, THEN 2 tablets (50 mg total) daily. 10/22/21 11/28/21 Yes Tyyonna Soucy, LNunzio Cobbs CNM  ? ? ? ?Prenatal care site: [facility]  ? ?Social History: She  reports that she has never smoked. She has never used smokeless tobacco. She reports that she does not drink alcohol and does not use drugs. ? ?Family History: family history includes Cervical cancer (age of onset: 361 in her mother.  ? ?Review of Systems: A full review of systems was performed and negative except as noted in the HPI.   ? ? ?Physical Exam:  ?Vital Signs: BP 128/82   Wt 199 lb (90.3 kg)   LMP 02/13/2021 (Exact Date)   BMI 36.69 kg/m?  ?General: no acute distress.  ?HEENT: normocephalic, atraumatic ?Heart: regular rate & rhythm.  No murmurs/rubs/gallops ?Lungs: clear to auscultation bilaterally, normal respiratory effort ?Abdomen: soft, gravid, non-tender;  EFW: 8lbs ?Pelvic:   ? External: Normal external female genitalia ? Cervix: Dilation: 2.5 / Effacement (%): 528/ Station: -2  ?  ?Extremities: non-tender, symmetric, trace edema bilaterally.   ?Neurologic: Alert & oriented x 3.   ? ?Results for orders placed or performed in visit on 11/19/21 (from the past 24 hour(s))  ?POC Urinalysis Dipstick OB     Status: None  ? Collection Time: 11/19/21  3:42 PM  ?Result Value Ref Range  ? Color, UA    ? Clarity, UA    ? Glucose, UA Negative Negative  ? Bilirubin, UA    ? Ketones, UA    ? Spec Grav, UA    ? Blood, UA    ? pH, UA    ? POC,PROTEIN,UA Negative Negative, Trace, Small (1+), Moderate (2+), Large (3+), 4+  ? Urobilinogen, UA    ? Nitrite, UA    ? Leukocytes, UA    ? Appearance    ? Odor    ? ? ?Pertinent Results:  ?Prenatal Labs: ?Blood type/Rh A pos   ?Antibody screen negative  ?Rubella Immune  ?Varicella Immune  ?RPR NR  ?HBsAg Neg  ?HIV NR  ?GC neg  ?Chlamydia neg  ?Genetic screening neg  ?1 hour GTT   ?3 hour GTT   ?GBS Neg   ? ?FHT: to be done on admission  ?TOCO: ?SVE:  Dilation:  2.5 / Effacement (%): 79 / Station: -2  ?  ?Cephalic by leopolds ? ?[place] MFM OB FOLLOW UP ? ?Result Date: 10/25/2021 ?----------------------------------------------------------------------  OBSTETRICS REPORT                       (Signed Final 10/25/2021 04:03 pm) ---------------------------------------------------------------------- Patient Info  ID #:       [id number]                          D.O.B.:  07-Nov-1990 (30 yrs)  Name:       Wendy Johnston                    Visit Date: 10/25/2021 03:50 pm              [place] ---------------------------------------------------------------------- Performed By  Attending:        Tama High MD        Ref. Address:     [address] [place],                                                             [place] [address]  Performed By:     Benson [place]          Secondary Phy.:   [facility]                     RDMS  Referred By:      Estill Bamberg              Location:         [facility]                               [facility] at                                                             [place] [facility]                                                             [facility] ---------------------------------------------------------------------- Orders  #  Description                           Code        Ordered By  1  [place] MFM OB FOLLOW UP                   [id number]    RAVI [facility] ----------------------------------------------------------------------  #  Order #                     Accession #                Episode #  1  [id number]                   [id number]                 [id number] ---------------------------------------------------------------------- Indications  Obesity complicating pregnancy, third          O99.213  trimester (BMI 31)  Pregnancy complicated by previous gastric      O99.843  bypass, antepartum, third trimester  LR NIPS, Neg [place]  [redacted] weeks gestation of pregnancy                Z3A.36 ---------------------------------------------------------------------- Fetal Evaluation  Num Of Fetuses:         1  Fetal Heart Rate(bpm):  112  Cardiac Activity:       Observed  Presentation:           Cephalic  Placenta:               Anterior  P. Cord Insertion:      Previously Visualized  Amniotic Fluid  AFI FV:      Within normal limits  AFI Sum(cm)     %Tile       Largest Pocket(cm)  16.4            61          4.6  RUQ(cm)       RLQ(cm)       LUQ(cm)        LLQ(cm)  4             4             3.8            4.6 ---------------------------------------------------------------------- Biometry  BPD:      89.8  mm     G. Age:  36w 3d         63  %    CI:        75.51   %    70 - 86                                                          FL/HC:      20.8   %    20.1 - 22.1  HC:      327.7  mm     G. Age:  37w 1d         43  %    HC/AC:      0.96        0.93 - 1.11  AC:      342.2  mm      G. Age:  38w 1d         95  %    FL/BPD:     75.7   %    71 - 87  FL:  68  mm     G. Age:  35w 0d         15  %    FL/AC:      19.9   %    20 - 24  Est. FW:    3110  gm    6 lb 14 oz      74  % ---------------------------------------------------------------------- OB History  Gravidity:    1 ---------------------------------------------------------------------- Gestational Age  LMP:           36w 2d        Date:  02/13/21                 EDD:   11/20/21  U/S Today:     36w 5d                                        EDD:   11/17/21  Best:          36w 2d     Det. By:  LMP  (02/13/21)          EDD:   11/20/21 ---------------------------------------------------------------------- Anatomy  Stomach:               Appears normal, left   Bladder:                Appears normal                         sided  Kidneys:               Appear normal ---------------------------------------------------------------------- Cervix Uterus Adnexa  Cervix  Not visualized (advanced GA >24wks)  Adnexa  No abnormality visualized. ---------------------------------------------------------------------- Impression  Fetal growth is appropriate for gestational age .Amniotic fluid  is normal and good fetal activity is seen .Cephalic  presentation. ---------------------------------------------------------------------- Recommendations  -No follow-up appointments were made . ----------------------------------------------------------------------                  Tama High, MD Electronically Signed Final Report   10/25/2021 04:03 pm ----------------------------------------------------------------------  ? ?Assessment:  ?Wendy Johnston is a 31 y.o. G1P0000 female at 25w6dwith postdates induction of labor.  ? ?Plan:  ?Admit to Labor & Delivery ?CBC, T&S, Clrs, IVF, Pitocin  ?GBS  Negative ?Consents obtained. ?Continuous efm/toco ?Pain management: aware of all options, will ask if desired.  ? ?----- ?LRoberto Scales CNM  ?[place]   ?

## 2021-11-22 ENCOUNTER — Other Ambulatory Visit: Payer: Self-pay

## 2021-11-22 ENCOUNTER — Inpatient Hospital Stay
Admission: EM | Admit: 2021-11-22 | Discharge: 2021-11-25 | DRG: 806 | Disposition: A | Discharge status: Home / Self Care | Payer: BC Managed Care – PPO | Attending: Obstetrics & Gynecology | Admitting: Obstetrics & Gynecology

## 2021-11-22 ENCOUNTER — Encounter: Payer: Self-pay | Admitting: Obstetrics & Gynecology

## 2021-11-22 DIAGNOSIS — O9081 Anemia of the puerperium: Secondary | ICD-10-CM | POA: Diagnosis not present

## 2021-11-22 DIAGNOSIS — Z3A40 40 weeks gestation of pregnancy: Secondary | ICD-10-CM

## 2021-11-22 DIAGNOSIS — O99840 Bariatric surgery status complicating pregnancy, unspecified trimester: Secondary | ICD-10-CM | POA: Diagnosis present

## 2021-11-22 DIAGNOSIS — O480 Post-term pregnancy: Principal | ICD-10-CM | POA: Diagnosis present

## 2021-11-22 DIAGNOSIS — O99844 Bariatric surgery status complicating childbirth: Secondary | ICD-10-CM | POA: Diagnosis present

## 2021-11-22 DIAGNOSIS — O0990 Supervision of high risk pregnancy, unspecified, unspecified trimester: Principal | ICD-10-CM

## 2021-11-22 DIAGNOSIS — O99214 Obesity complicating childbirth: Secondary | ICD-10-CM | POA: Diagnosis present

## 2021-11-22 DIAGNOSIS — O1404 Mild to moderate pre-eclampsia, complicating childbirth: Secondary | ICD-10-CM | POA: Diagnosis present

## 2021-11-22 DIAGNOSIS — D62 Acute posthemorrhagic anemia: Secondary | ICD-10-CM | POA: Diagnosis not present

## 2021-11-22 DIAGNOSIS — O99213 Obesity complicating pregnancy, third trimester: Secondary | ICD-10-CM

## 2021-11-22 NOTE — OB Triage Note (Signed)
Patient presents to LD with c/o leaking of fluid since 1400 today, patient reports clear fluid, denies vaginal bleeding or contractions, headache, blurred vision, dizziness, or nausea. ?

## 2021-11-23 ENCOUNTER — Encounter: Payer: Self-pay | Admitting: Oncology

## 2021-11-23 DIAGNOSIS — Z3A40 40 weeks gestation of pregnancy: Secondary | ICD-10-CM | POA: Diagnosis not present

## 2021-11-23 DIAGNOSIS — O09813 Supervision of pregnancy resulting from assisted reproductive technology, third trimester: Secondary | ICD-10-CM

## 2021-11-23 DIAGNOSIS — D62 Acute posthemorrhagic anemia: Secondary | ICD-10-CM | POA: Diagnosis not present

## 2021-11-23 DIAGNOSIS — O1404 Mild to moderate pre-eclampsia, complicating childbirth: Secondary | ICD-10-CM | POA: Diagnosis present

## 2021-11-23 DIAGNOSIS — O99844 Bariatric surgery status complicating childbirth: Secondary | ICD-10-CM | POA: Diagnosis present

## 2021-11-23 DIAGNOSIS — O99214 Obesity complicating childbirth: Secondary | ICD-10-CM | POA: Diagnosis present

## 2021-11-23 DIAGNOSIS — O480 Post-term pregnancy: Secondary | ICD-10-CM | POA: Diagnosis present

## 2021-11-23 DIAGNOSIS — E538 Deficiency of other specified B group vitamins: Secondary | ICD-10-CM

## 2021-11-23 DIAGNOSIS — O09293 Supervision of pregnancy with other poor reproductive or obstetric history, third trimester: Secondary | ICD-10-CM

## 2021-11-23 DIAGNOSIS — Z3490 Encounter for supervision of normal pregnancy, unspecified, unspecified trimester: Secondary | ICD-10-CM | POA: Insufficient documentation

## 2021-11-23 DIAGNOSIS — O9081 Anemia of the puerperium: Secondary | ICD-10-CM | POA: Diagnosis not present

## 2021-11-23 DIAGNOSIS — O99284 Endocrine, nutritional and metabolic diseases complicating childbirth: Secondary | ICD-10-CM

## 2021-11-23 DIAGNOSIS — O4202 Full-term premature rupture of membranes, onset of labor within 24 hours of rupture: Secondary | ICD-10-CM

## 2021-11-23 DIAGNOSIS — E669 Obesity, unspecified: Secondary | ICD-10-CM

## 2021-11-23 LAB — CBC
HCT: 37.9 % (ref 36.0–46.0)
Hemoglobin: 12.7 g/dL (ref 12.0–15.0)
MCH: 27.6 pg (ref 26.0–34.0)
MCHC: 33.5 g/dL (ref 30.0–36.0)
MCV: 82.4 fL (ref 80.0–100.0)
Platelets: 224 10*3/uL (ref 150–400)
RBC: 4.6 MIL/uL (ref 3.87–5.11)
RDW: 13.2 % (ref 11.5–15.5)
WBC: 10.7 10*3/uL — ABNORMAL HIGH (ref 4.0–10.5)
nRBC: 0 % (ref 0.0–0.2)

## 2021-11-23 LAB — COMPREHENSIVE METABOLIC PANEL
ALT: 9 U/L (ref 0–44)
AST: 15 U/L (ref 15–41)
Albumin: 2.8 g/dL — ABNORMAL LOW (ref 3.5–5.0)
Alkaline Phosphatase: 156 U/L — ABNORMAL HIGH (ref 38–126)
Anion gap: 10 (ref 5–15)
BUN: 17 mg/dL (ref 6–20)
CO2: 19 mmol/L — ABNORMAL LOW (ref 22–32)
Calcium: 8.7 mg/dL — ABNORMAL LOW (ref 8.9–10.3)
Chloride: 107 mmol/L (ref 98–111)
Creatinine, Ser: 0.63 mg/dL (ref 0.44–1.00)
GFR, Estimated: >60 mL/min (ref >60)
Glucose, Bld: 84 mg/dL (ref 70–99)
Potassium: 3.8 mmol/L (ref 3.5–5.1)
Sodium: 136 mmol/L (ref 135–145)
Total Bilirubin: 0.5 mg/dL (ref 0.3–1.2)
Total Protein: 6.3 g/dL — ABNORMAL LOW (ref 6.5–8.1)

## 2021-11-23 LAB — TYPE AND SCREEN
ABO/RH(D): A POS
Antibody Screen: NEGATIVE

## 2021-11-23 LAB — SAMPLE TO BLOOD BANK

## 2021-11-23 LAB — RPR: RPR Ser Ql: NONREACTIVE

## 2021-11-23 LAB — PROTEIN / CREATININE RATIO, URINE
Creatinine, Urine: 221 mg/dL
Protein Creatinine Ratio: 0.36 mg/mg{Cre} — ABNORMAL HIGH (ref 0.00–0.15)
Total Protein, Urine: 80 mg/dL

## 2021-11-23 MED ORDER — DIBUCAINE (PERIANAL) 1 % EX OINT: 1.0000 "application " | TOPICAL_OINTMENT | CUTANEOUS | Status: DC | PRN

## 2021-11-23 MED ORDER — LABETALOL HCL 5 MG/ML IV SOLN
20.0000 mg | INTRAVENOUS | Status: DC | PRN
  Filled 2021-11-23: qty 4

## 2021-11-23 MED ORDER — SENNOSIDES-DOCUSATE SODIUM 8.6-50 MG PO TABS
2.0000 | ORAL_TABLET | ORAL | Status: DC
  Administered 2021-11-23 – 2021-11-25 (×3): 2 via ORAL
  Filled 2021-11-23 (×3): qty 2

## 2021-11-23 MED ORDER — IBUPROFEN 600 MG PO TABS: 600.0000 mg | ORAL_TABLET | Freq: Four times a day (QID) | ORAL | Status: DC

## 2021-11-23 MED ORDER — WITCH HAZEL-GLYCERIN EX PADS: 1.0000 "application " | MEDICATED_PAD | CUTANEOUS | Status: DC | PRN

## 2021-11-23 MED ORDER — LACTATED RINGERS IV SOLN
500.0000 mL | INTRAVENOUS | Status: DC | PRN
  Administered 2021-11-23: 500 mL via INTRAVENOUS

## 2021-11-23 MED ORDER — TERBUTALINE SULFATE 1 MG/ML IJ SOLN
0.2500 mg | Freq: Once | INTRAMUSCULAR | Status: AC | PRN
  Administered 2021-11-23: 0.25 mg via SUBCUTANEOUS
  Filled 2021-11-23: qty 1

## 2021-11-23 MED ORDER — COCONUT OIL OIL
1.0000 "application " | TOPICAL_OIL | Status: DC | PRN
  Administered 2021-11-25: 1 via TOPICAL
  Filled 2021-11-23: qty 120

## 2021-11-23 MED ORDER — OXYCODONE-ACETAMINOPHEN 5-325 MG PO TABS: 1.0000 | ORAL_TABLET | ORAL | Status: DC | PRN

## 2021-11-23 MED ORDER — LACTATED RINGERS IV SOLN: INTRAVENOUS | Status: DC

## 2021-11-23 MED ORDER — SODIUM CHLORIDE 0.9 % IV SOLN
250.0000 mL | INTRAVENOUS | Status: DC | PRN
Start: 2021-11-23 — End: 2021-11-25

## 2021-11-23 MED ORDER — OXYTOCIN 10 UNIT/ML IJ SOLN
INTRAMUSCULAR | Status: AC
  Filled 2021-11-23: qty 2

## 2021-11-23 MED ORDER — MAGNESIUM SULFATE 40 GM/1000ML IV SOLN: 2.0000 g/h | INTRAVENOUS | Status: DC

## 2021-11-23 MED ORDER — MISOPROSTOL 25 MCG QUARTER TABLET: 25.0000 ug | ORAL_TABLET | ORAL | Status: DC | PRN

## 2021-11-23 MED ORDER — SIMETHICONE 80 MG PO CHEW: 80.0000 mg | CHEWABLE_TABLET | ORAL | Status: DC | PRN

## 2021-11-23 MED ORDER — DIPHENHYDRAMINE HCL 25 MG PO CAPS: 25.0000 mg | ORAL_CAPSULE | Freq: Four times a day (QID) | ORAL | Status: DC | PRN

## 2021-11-23 MED ORDER — AMMONIA AROMATIC IN INHA
RESPIRATORY_TRACT | Status: AC
  Filled 2021-11-23: qty 10

## 2021-11-23 MED ORDER — OXYTOCIN-SODIUM CHLORIDE 30-0.9 UT/500ML-% IV SOLN
1.0000 m[IU]/min | INTRAVENOUS | Status: DC
  Administered 2021-11-23: 2 m[IU]/min via INTRAVENOUS
  Filled 2021-11-23 (×2): qty 500

## 2021-11-23 MED ORDER — LACTATED RINGERS AMNIOINFUSION
INTRAVENOUS | Status: DC
  Filled 2021-11-23: qty 1000

## 2021-11-23 MED ORDER — NIFEDIPINE 10 MG PO CAPS
10.0000 mg | ORAL_CAPSULE | Freq: Three times a day (TID) | ORAL | Status: DC
  Administered 2021-11-23: 10 mg via ORAL
  Filled 2021-11-23: qty 1

## 2021-11-23 MED ORDER — OXYCODONE-ACETAMINOPHEN 5-325 MG PO TABS: 2.0000 | ORAL_TABLET | ORAL | Status: DC | PRN

## 2021-11-23 MED ORDER — ACETAMINOPHEN 325 MG PO TABS
650.0000 mg | ORAL_TABLET | ORAL | Status: DC | PRN
  Administered 2021-11-23 – 2021-11-25 (×6): 650 mg via ORAL
  Filled 2021-11-23 (×7): qty 2

## 2021-11-23 MED ORDER — ZOLPIDEM TARTRATE 5 MG PO TABS: 5.0000 mg | ORAL_TABLET | Freq: Every evening | ORAL | Status: DC | PRN

## 2021-11-23 MED ORDER — LABETALOL HCL 5 MG/ML IV SOLN: 40.0000 mg | INTRAVENOUS | Status: DC | PRN

## 2021-11-23 MED ORDER — OXYTOCIN-SODIUM CHLORIDE 30-0.9 UT/500ML-% IV SOLN: 2.5000 [IU]/h | INTRAVENOUS | Status: DC

## 2021-11-23 MED ORDER — ONDANSETRON HCL 4 MG/2ML IJ SOLN
4.0000 mg | Freq: Four times a day (QID) | INTRAMUSCULAR | Status: DC | PRN
Start: 2021-11-23 — End: 2021-11-23
  Administered 2021-11-23: 4 mg via INTRAVENOUS
  Filled 2021-11-23: qty 2

## 2021-11-23 MED ORDER — SODIUM CHLORIDE 0.9% FLUSH: 3.0000 mL | INTRAVENOUS | Status: DC | PRN

## 2021-11-23 MED ORDER — MAGNESIUM SULFATE BOLUS VIA INFUSION
4.0000 g | Freq: Once | INTRAVENOUS | Status: DC
  Filled 2021-11-23: qty 1000

## 2021-11-23 MED ORDER — LIDOCAINE HCL (PF) 1 % IJ SOLN
INTRAMUSCULAR | Status: AC
  Administered 2021-11-23: 30 mL via SUBCUTANEOUS
  Filled 2021-11-23: qty 30

## 2021-11-23 MED ORDER — BENZOCAINE-MENTHOL 20-0.5 % EX AERO
1.0000 "application " | INHALATION_SPRAY | CUTANEOUS | Status: DC | PRN
  Filled 2021-11-23: qty 56

## 2021-11-23 MED ORDER — SERTRALINE HCL 25 MG PO TABS
50.0000 mg | ORAL_TABLET | Freq: Every day | ORAL | Status: DC
  Administered 2021-11-24 – 2021-11-25 (×2): 50 mg via ORAL
  Filled 2021-11-23 (×2): qty 2

## 2021-11-23 MED ORDER — OXYTOCIN BOLUS FROM INFUSION
333.0000 mL | Freq: Once | INTRAVENOUS | Status: AC
  Administered 2021-11-23: 333 mL via INTRAVENOUS

## 2021-11-23 MED ORDER — MISOPROSTOL 200 MCG PO TABS
ORAL_TABLET | ORAL | Status: AC
  Filled 2021-11-23: qty 4

## 2021-11-23 MED ORDER — LIDOCAINE HCL (PF) 1 % IJ SOLN: 30.0000 mL | INTRAMUSCULAR | Status: AC | PRN

## 2021-11-23 MED ORDER — LABETALOL HCL 5 MG/ML IV SOLN: 80.0000 mg | INTRAVENOUS | Status: DC | PRN

## 2021-11-23 MED ORDER — BUTORPHANOL TARTRATE 1 MG/ML IJ SOLN
1.0000 mg | INTRAMUSCULAR | Status: DC | PRN
  Administered 2021-11-23: 1 mg via INTRAVENOUS
  Filled 2021-11-23: qty 1

## 2021-11-23 MED ORDER — HYDRALAZINE HCL 20 MG/ML IJ SOLN: 10.0000 mg | INTRAMUSCULAR | Status: DC | PRN

## 2021-11-23 MED ORDER — SODIUM CHLORIDE 0.9% FLUSH: 3.0000 mL | Freq: Two times a day (BID) | INTRAVENOUS | Status: DC

## 2021-11-23 MED ORDER — ONDANSETRON HCL 4 MG/2ML IJ SOLN: 4.0000 mg | INTRAMUSCULAR | Status: DC | PRN

## 2021-11-23 MED ORDER — SOD CITRATE-CITRIC ACID 500-334 MG/5ML PO SOLN
30.0000 mL | ORAL | Status: DC | PRN
Start: 2021-11-23 — End: 2021-11-23

## 2021-11-23 MED ORDER — ONDANSETRON HCL 4 MG PO TABS
4.0000 mg | ORAL_TABLET | ORAL | Status: DC | PRN
Start: 2021-11-23 — End: 2021-11-25

## 2021-11-23 NOTE — Discharge Summary (Addendum)
?OB Discharge Summary  ?   ?Patient Name: Wendy Johnston ?DOB: 1990/12/16 ?MRN: [id number] ? ?Date of admission: 11/22/2021 ?Delivering MD: Hoyt Koch, MD  ?Date of Delivery: 11/23/2021  ?Date of discharge: 11/25/2021 ? ?Admitting diagnosis: Labor and delivery, indication for care [O75.9] ?Encounter for induction of labor [Z34.90] ?Intrauterine pregnancy: [redacted]w[redacted]d Preeclampsia ?Secondary diagnosis:  history of ART, history of gastric bypass ?    ?Discharge diagnosis: Term Pregnancy Delivered and Preeclampsia (mild), No other diagnosis     ?History of ART, History of gastric bypass                    ? ?Hospital course:  Onset of Labor With Vaginal Delivery      ?31y.o. yo G1P0000 at 469w3das admitted with SROM in Active Labor on 11/22/2021. Patient had an uncomplicated labor course as follows:  ?Membrane Rupture Time/Date: 2:00 PM ,11/22/2021   ?Delivery Method:Vaginal, Spontaneous  ?Episiotomy: None  ?Lacerations:  2nd degree  ?See delivery note for details ? ?Patient had an uncomplicated postpartum course.  She is ambulating, tolerating a regular diet, passing flatus, and urinating well. She reports breastfeeding is going well. She was started on Labetalol for BP regulation and is responding well to treatment. ? ?Patient is discharged home in stable condition on 11/25/21. ? ?Newborn Data: ?Birth date:11/23/2021  ?Birth time:8:08 AM  ?Gender:Female EmLillia PaulsLiving status:Living  ?Apgars:8 ,9  ?Weight:3310 g                                                                 ? ?Post partum procedures:none ? ?Complications: None ? ?Physical exam on 11/25/2021: ?Vitals:  ? 11/24/21 1926 11/24/21 2303 11/25/21 0320 11/25/21 0743  ?BP: (!) 144/93 (!) 138/95 120/74 128/80  ?Pulse: 79 64 69 64  ?Resp: '18 18 18 20  '$ ?Temp: 98.1 ?F (36.7 ?C) 97.9 ?F (36.6 ?C) 98.4 ?F (36.9 ?C) 97.8 ?F (36.6 ?C)  ?TempSrc: Oral Oral Oral Oral  ?SpO2: 99% 98% 100% 100%  ?Weight:      ?Height:      ? ?Patient Vitals for the past 24 hrs: ? BP Temp  Temp src Pulse Resp SpO2  ?11/25/21 0743 128/80 97.8 ?F (36.6 ?C) Oral 64 20 100 %  ?11/25/21 0320 120/74 98.4 ?F (36.9 ?C) Oral 69 18 100 %  ?11/24/21 2303 (!) 138/95 97.9 ?F (36.6 ?C) Oral 64 18 98 %  ?11/24/21 1926 (!) 144/93 98.1 ?F (36.7 ?C) Oral 79 18 99 %  ?11/24/21 1554 130/79 98.3 ?F (36.8 ?C) Axillary 81 15 100 %  ?11/24/21 1552 130/79 98.3 ?F (36.8 ?C) Oral 81 -- 100 %  ?11/24/21 1401 (!) 141/99 98.1 ?F (36.7 ?C) Oral 71 15 98 %  ?11/24/21 1142 121/75 98.2 ?F (36.8 ?C) Oral 82 16 99 %  ?11/24/21 0933 (!) 137/93 -- -- -- -- --  ?  ? ?General: alert, cooperative, and no distress ?Lochia: appropriate ?Uterine Fundus: firm ?Incision: N/A ?DVT Evaluation: No evidence of DVT seen on physical exam. ? ?Labs: ?Lab Results  ?Component Value Date  ? WBC 10.4 11/24/2021  ? HGB 10.7 (L) 11/24/2021  ? HCT 31.3 (L) 11/24/2021  ? MCV 82.2 11/24/2021  ? PLT 203 11/24/2021  ? ?CMP Latest Ref Rng & Units  11/22/2021  ?Glucose 70 - 99 mg/dL 84  ?BUN 6 - 20 mg/dL 17  ?Creatinine 0.44 - 1.00 mg/dL 0.63  ?Sodium 135 - 145 mmol/L 136  ?Potassium 3.5 - 5.1 mmol/L 3.8  ?Chloride 98 - 111 mmol/L 107  ?CO2 22 - 32 mmol/L 19(L)  ?Calcium 8.9 - 10.3 mg/dL 8.7(L)  ?Total Protein 6.5 - 8.1 g/dL 6.3(L)  ?Total Bilirubin 0.3 - 1.2 mg/dL 0.5  ?Alkaline Phos 38 - 126 U/L 156(H)  ?AST 15 - 41 U/L 15  ?ALT 0 - 44 U/L 9  ? ? ?Discharge instruction: per After Visit Summary. ? ?Medications:  ?Allergies as of 11/25/2021   ? ?   Reactions  ? Nsaids   ? Patient has history of gastric bypass.    ? ?  ? ?  ?Medication List  ?  ? ?TAKE these medications   ? ?labetalol 200 MG tablet ?Commonly known as: NORMODYNE ?Take 1 tablet (200 mg total) by mouth 2 (two) times daily. ?  ?PRENATAL GUMMIES PO ?Take by mouth. ?  ?sertraline 25 MG tablet ?Commonly known as: Zoloft ?Take 1 tablet (25 mg total) by mouth daily for 7 days, THEN 2 tablets (50 mg total) daily. ?Start taking on: October 22, 2021 ?  ? ?  ? ? ?Diet: routine diet ? ?Activity: Advance as tolerated.  Pelvic rest for 6 weeks.  ? ?Outpatient follow up: ? Follow-up Information   ? ? Gae Dry, MD. Go in 1 week(s).   ?Specialty: Obstetrics and Gynecology ?Why: BP check and make appointment for 6 week postpartum visit ?Contact information: ?[address] ?[place] [address] ?[phone] ? ? ?  ?  ? ?  ?  ? ?  ?   ? ?Postpartum contraception: Condoms ?Rhogam Given postpartum: no ?Rubella vaccine given postpartum: no ?Varicella vaccine given postpartum: no ?TDaP given antepartum or postpartum: Yes ? ? ? ?Newborn Delivery   ?Birth date/time: 11/23/2021 08:08:00 ?Delivery type: Vaginal, Spontaneous ?  ?  ? ? ?Baby Feeding: Breast ? ?Disposition:home with mother ? ?SIGNED: ?Rod Can, CNM ?11/25/2021 8:30 AM  ? ?

## 2021-11-23 NOTE — Progress Notes (Signed)
?  Labor Progress Note  ? ?31 y.o. G1P0000 @ [redacted]w[redacted]d, admitted for  ?Pregnancy, Labor Management. SROM, new diagnosis mild preeclampsia ? ?Subjective:  ?Patient is coping well with contractions.  ? ?Objective:  ?BP (!) 161/87   Pulse 61   Temp 97.7 ?F (36.5 ?C) (Oral)   Resp 18   LMP 02/13/2021 (Exact Date)  ?Abd: gravid, ND, FHT present, mild tenderness on exam ?Extr: no edema ?SVE: CERVIX: 9 cm dilated, 90 effaced, 0 station ?AROM of forebag for clear/pink scant fluid ? ?EFM: FHR: 105 bpm, variability: moderate,  accelerations:  Present,  decelerations:  Present primarily occurring with contractions, decel following arom of forebag, pitocin turned off, fluid bolus, position changes, attempt to place fse unsuccessful, IUPC placed with amnioinfusion, dose of terbutaline for continued regular contractions.  ?Toco: Frequency: Every 2 minutes ?Labs: I have reviewed the patient's lab results. ? ? ?Assessment & Plan:  ?G1P0000 @ 457w3dadmitted for  ?Pregnancy and Labor/Delivery Management ? ?1. Pain management:  position changes, had 1 dose of stadol . ?2. FWB: FHT category II.  ?3. ID: GBS negative ?4. Labor management: fetal resuscitation currently ? ?All discussed with patient, see orders ? ? ?JaRod CanCNM ?[facility] ?[facility] ?11/23/2021  6:37 AM ? ?

## 2021-11-23 NOTE — H&P (Signed)
OB History & Physical  ? ?History of Present Illness:  ?Chief Complaint: water broke ? ?HPI:  ?Wendy Johnston is a 31 y.o. G1P0000 female at 17w3ddated by LMP.  Her pregnancy has been complicated by previous gastric bypass, obesity, B12 deficiency, PCOS, pregnancy conceived on letrozole .   ? ?She reports contractions are beginning to feel "tighter".   She reports leakage of fluid since earlier this afternoon.   She denies vaginal bleeding.   She reports fetal movement.   ? ?Total weight gain for pregnancy: 17.7 kg  ? ?Obstetrical Problem List: ?July 2022 Problems (from 03/27/21 to present)   ? ? Problem Noted Resolved  ? Supervision of high risk pregnancy, antepartum 03/27/2021 by JWill Bonnet MD No  ? Overview Addendum 11/13/2021  3:36 PM by CMora Bellman MD  ?   ?Nursing Staff Provider  ?Office Location  [facility] Dating   LMP = 7 wk [place] ?Language  [place] [facility] n[facility]  ?Flu Vaccine   Genetic Screen  NIPS: maternT negative- xy. Inheritest neg  ?TDaP vaccine    Hgb A1C or  ?GTT Early : ?Third trimester :   ?Covid    LAB RESULTS   ?Rhogam  Not needed Blood Type A/Positive/-- (07/20 1150)   ?Feeding Plan breast Antibody Negative (07/20 1150)  ?Contraception condoms Rubella 1.87 (07/20 1150)  ?Circumcision  RPR Non Reactive (12/21 1046)   ?Pediatrician   HBsAg Negative (12/21 1046)   ?Support Person  HIV Non Reactive (12/21 1046)  ?Prenatal Classes  Discussed Varicella Immune  ?  GBS Negative/-- (02/14 1647)(For PCN allergy, check sensitivities) negative  ?BTL Consent     ?VBAC Consent  Pap  2022 NIL  ?  Hgb Electro   n/a  ?  CF neg  ?   SMA neg  ?     ? ?  ?  ? Previous gastric bypass affecting pregnancy, antepartum 03/27/2021 by JWill Bonnet MD No  ? Obesity affecting pregnancy 03/27/2021 by JWill Bonnet MD No  ? ?  ?  ? ?Maternal Medical History:  ? ?Past Medical History:  ?Diagnosis Date  ? Anemia   ? Anxiety   ? Depression   ? Uterine adenomyoma   ? JUVENILE CYSTIC  ? ? ?Past  Surgical History:  ?Procedure Laterality Date  ? GASTRIC ROUX-EN-Y    ? LAPAROSCOPIC GASTRIC BYPASS  02/24/2017  ? WISDOM TOOTH EXTRACTION    ? ? ?No Known Allergies ? ?Prior to Admission medications   ?Medication Sig Start Date End Date Taking? Authorizing Provider  ?Prenatal MV & Min w/FA-DHA (PRENATAL GUMMIES PO) Take by mouth.    [provider]  ?sertraline (ZOLOFT) 25 MG tablet Take 1 tablet (25 mg total) by mouth daily for 7 days, THEN 2 tablets (50 mg total) daily. 10/22/21 11/28/21  Dominic,Nunzio Cobbs CNM  ? ? ?OB History  ?Gravida Para Term Preterm AB Living  ?1 0 0 0 0 0  ?SAB IAB Ectopic Multiple Live Births  ?0 0 0 0 0  ?  ?# Outcome Date GA Lbr Len/2nd Weight Sex Delivery Anes PTL Lv  ?1 Current           ? ? ?[facility] ? ?Social History: She  reports that she has never smoked. She has never used smokeless tobacco. She reports that she does not drink alcohol and does not use drugs. ? ?Family History: family history includes Cervical cancer (age of  onset: 106) in her mother.  ? ? ?Review of Systems:  ?Review of Systems  ?Constitutional:  Negative for chills and fever.  ?HENT:  Negative for congestion, ear discharge, ear pain, hearing loss, sinus pain and sore throat.   ?Eyes:  Negative for blurred vision and double vision.  ?Respiratory:  Negative for cough, shortness of breath and wheezing.   ?Cardiovascular:  Negative for chest pain, palpitations and leg swelling.  ?Gastrointestinal:  Negative for abdominal pain, blood in stool, constipation, diarrhea, heartburn, melena, nausea and vomiting.  ?Genitourinary:  Negative for dysuria, flank pain, frequency, hematuria and urgency.  ?Musculoskeletal:  Negative for back pain, joint pain and myalgias.  ?Skin:  Negative for itching and rash.  ?Neurological:  Negative for dizziness, tingling, tremors, sensory change, speech change, focal weakness, seizures, loss of consciousness, weakness and headaches.  ?Endo/Heme/Allergies:   Negative for environmental allergies. Does not bruise/bleed easily.  ?Psychiatric/Behavioral:  Negative for depression, hallucinations, memory loss, substance abuse and suicidal ideas. The patient is not nervous/anxious and does not have insomnia.    ? ?Physical Exam:  ?BP (!) 158/104   Pulse 67   Temp 97.9 ?F (36.6 ?C) (Oral)   Resp 18   LMP 02/13/2021 (Exact Date)   ?Constitutional: Well nourished, well developed female in no acute distress.  ?HEENT: normal ?Skin: Warm and dry.  ?Cardiovascular: Regular rate and rhythm.   ?Extremity:  no edema   ?Respiratory: Clear to auscultation bilateral. Normal respiratory effort ?Abdomen: FHT present ?Back: no CVAT ?Neuro: DTRs 2+, Cranial nerves grossly intact ?Psych: Alert and Oriented x3. No memory deficits. Normal mood and affect.  ? ? ?Pelvic exam: (female chaperone present) ?is not limited by body habitus ?EGBUS: within normal limits ?Vagina: within normal limits and with normal mucosa  ?Cervix: sterile speculum exam/+ pooling, 3/60/-2 ? ? ?Baseline FHR: 110 beats/min   Variability: moderate   Accelerations: present   Decelerations: absent ?Contractions: present frequency: every 2-7 ?Overall assessment: reassuring ? ? ?No results found for: SARSCOV2NAA] ? ?Assessment:  ?Wendy Johnston is a 31 y.o. G1P0000 female at 59w3dwith SROM earlier today.  ? ?Plan:  ?Admit to Labor & Delivery  ?CBC, T&S, Clrs, IVF ?GBS negative.   ?Fetal well-being: Category I ?Start pitocin after 1 hour if contractions are not increasing in frequency ?Elevated BP: evaluate for PIH ?  ? ?JRod Can CNM ?11/23/2021 12:24 AM  ?  ?

## 2021-11-23 NOTE — Plan of Care (Signed)
Vaginal delivery at 0808 of viable female. ?

## 2021-11-23 NOTE — Progress Notes (Signed)
Pt requested to wait to do assessments on her and baby til baby wakes ?

## 2021-11-24 LAB — CBC
HCT: 31.3 % — ABNORMAL LOW (ref 36.0–46.0)
Hemoglobin: 10.7 g/dL — ABNORMAL LOW (ref 12.0–15.0)
MCH: 28.1 pg (ref 26.0–34.0)
MCHC: 34.2 g/dL (ref 30.0–36.0)
MCV: 82.2 fL (ref 80.0–100.0)
Platelets: 203 10*3/uL (ref 150–400)
RBC: 3.81 MIL/uL — ABNORMAL LOW (ref 3.87–5.11)
RDW: 13.4 % (ref 11.5–15.5)
WBC: 10.4 10*3/uL (ref 4.0–10.5)
nRBC: 0 % (ref 0.0–0.2)

## 2021-11-24 MED ORDER — LABETALOL HCL 200 MG PO TABS
200.0000 mg | ORAL_TABLET | Freq: Two times a day (BID) | ORAL | Status: DC
Start: 2021-11-24 — End: 2021-11-25
  Administered 2021-11-24 – 2021-11-25 (×3): 200 mg via ORAL
  Filled 2021-11-24 (×3): qty 1

## 2021-11-24 MED ORDER — BUTALBITAL-APAP-CAFFEINE 50-325-40 MG PO TABS
2.0000 | ORAL_TABLET | ORAL | Status: DC | PRN
  Administered 2021-11-24 (×2): 2 via ORAL
  Filled 2021-11-24 (×2): qty 2

## 2021-11-24 NOTE — Progress Notes (Signed)
Admit Date: 11/22/2021 ?Today's Date: 11/24/2021 ? ?Post Partum Day 1 ? ?Subjective:  ?no complaints, up ad lib, voiding, and tolerating PO ? ?Objective: ?Temp:  [97.7 ?F (36.5 ?C)-98.5 ?F (36.9 ?C)] 97.7 ?F (36.5 ?C) (03/19 5035) ?Pulse Rate:  [70-101] 77 (03/19 0834) ?Resp:  [15-18] 15 (03/19 0834) ?BP: (119-154)/(71-107) 137/93 (03/19 0933) ?SpO2:  [98 %-100 %] 100 % (03/19 0834) ?Weight:  [89.4 kg] 89.4 kg (03/18 1022) ? ?Physical Exam:  ?General: alert, cooperative, and no distress ?Lochia: appropriate ?Uterine Fundus: firm ?Incision: none ?DVT Evaluation: No evidence of DVT seen on physical exam. ? ?Recent Labs  ?  11/23/21 ?0155 11/24/21 ?4656  ?HGB 12.7 10.7*  ?HCT 37.9 31.3*  ? ? ?Assessment/Plan: ?Preeclampsia ?S/p NSVD ?Anemia, mild, from acute blood loss (delivery) ? ?Plan for discharge tomorrow, Breastfeeding, Contraception (plans condoms), and Infant doing well ?BPs have been elevated more than not postpartum, so will start Labetalol and assess response ?PN, Iron daily ? ? LOS: 1 day  ? ?Hoyt Koch ?[place] ?11/24/2021, 10:04 AM  ? ? ?

## 2021-11-24 NOTE — TOC Initial Note (Addendum)
Transition of Care (TOC) - Initial/Assessment Note  ? ? ?Patient Details  ?Name: Wendy Johnston ?MRN: [id number] ?Date of Birth: 1991/04/06 ? ?Transition of Care (TOC) CM/SW Contact:    ?[place], LCSWA ?Phone Number: ?11/24/2021, 8:56 AM ? ?Clinical Narrative:                 ? ?TOC consult due to patient history of anxiety and depression. CSW spoke to patient and she reported having a mental health therapist and plan to start back on medication. Patient reported having strong support at home with husband and mother in law. Patient reported having essential baby items. Patient has PCP office and plans to follow up to determine assigned provider. Patient confirmed knowing PPD symptoms and to reach out to PCP if she feels any symptoms.  ? ? ? No further TOC needs. ?  ?  ? ? ?Patient Goals and CMS Choice ?  ?  ?  ? ?Expected Discharge Plan and Services ?  ?  ?  ?  ?  ?                ?  ?  ?  ?  ?  ?  ?  ?  ?  ?  ? ?Prior Living Arrangements/Services ?  ?  ?  ?       ?  ?  ?  ?  ? ?Activities of Daily Living ?Home Assistive Devices/Equipment: None ?ADL Screening (condition at time of admission) ?Patient's cognitive ability adequate to safely complete daily activities?: Yes ?Is the patient deaf or have difficulty hearing?: No ?Does the patient have difficulty seeing, even when wearing glasses/contacts?: No ?Does the patient have difficulty concentrating, remembering, or making decisions?: No ?Patient able to express need for assistance with ADLs?: Yes ?Does the patient have difficulty dressing or bathing?: No ?Independently performs ADLs?: Yes (appropriate for developmental age) ?Does the patient have difficulty walking or climbing stairs?: No ?Weakness of Legs: None ?Weakness of Arms/Hands: None ? ?Permission Sought/Granted ?  ?  ?   ?   ?   ?   ? ?Emotional Assessment ?  ?  ?  ?  ?  ?  ? ?Admission diagnosis:  Labor and delivery, indication for care [O75.9] ?Encounter for induction of labor  [Z34.90] ?Patient Active Problem List  ? Diagnosis Date Noted  ? Encounter for induction of labor 11/23/2021  ? Labor and delivery, indication for care 11/22/2021  ? B12 deficiency 07/08/2021  ? Supervision of high risk pregnancy, antepartum 03/27/2021  ? Previous gastric bypass affecting pregnancy, antepartum 03/27/2021  ? Obesity affecting pregnancy 03/27/2021  ? Iron deficiency anemia 11/26/2020  ? PCOS (polycystic ovarian syndrome) 01/04/2017  ? ?PCP:  Philmore Pali, NP ?Pharmacy:   ?[place], [place] - [address] [place] ?[place] ?[place] [address] ?Phone: [phone] Fax: [phone] ? ? ? ? ?Social Determinants of Health (SDOH) Interventions ?  ? ?Readmission Risk Interventions ?No flowsheet data found. ? ? ?

## 2021-11-25 MED ORDER — LABETALOL HCL 200 MG PO TABS: 200.0000 mg | ORAL_TABLET | Freq: Two times a day (BID) | ORAL | 2 refills | Status: DC

## 2021-11-25 NOTE — Lactation Note (Signed)
This note was copied from a baby's chart. ?Lactation Consultation Note ? ?Patient Name: Wendy Johnston ?Today's Date: 11/25/2021 ?Reason for consult: Initial assessment;Primapara;Term;Nipple pain/trauma;RN request;Other (Comment) (Gastric bypass surgery) ?Age:31 hours ? ?Initial lactation visit prior to anticipated discharge. P1, SVD 49hrs ago. ? ?Maternal Data ?Has patient been taught Hand Expression?: Yes ?Does the patient have breastfeeding experience prior to this delivery?: No ? ?Encouraged mom to use hand expression to express drops and allow to air dry for healing of nipples. ? ?Feeding ?Mother's Current Feeding Choice: Breast Milk ? ?LATCH Score ? Baby was sleeping with dad upon entry. ? ?  ? ?  ? ?  ? ?  ? ?  ? ? ?Lactation Tools Discussed/Used ?Tools: Comfort gels;Coconut oil ? ?Mom has slightly damaged nipples bilaterally. She states that her nipples come to a point at the end of the feeding.  ?Discussed use of comfort gels and coconut oil along with EBM for healing and comfort. ? ?Interventions ?Interventions: Breast feeding basics reviewed;Hand express;Pre-pump if needed;Breast compression;Coconut oil;Comfort gels;DEBP;Ice;Education ? ?Mom, knowledgeable in possible causes of nipple damage reviewed with LC position/alignment, flanging of top/bottom lips, breast compression throughout feeding to assist with milk removal, ensuring deep latch by rubbing nipple nose to chin with EBM on tip. Tips given for keeping baby awake during feeding for nutritive sucking. Educated on nutritive and non-nutritive sucking patterns.  ?Reviewed implementation of pump to assist with building appropriate milk supply. Mom has hx of gastric bypass surgery, both her and baby at risk for nutritional deficiency. Mom aware, and able to tell signs.  ?Encouraged to keep track of output, color changes of stool, cluster feeding/growth spurts.  ? ?Discharge ?Discharge Education: Engorgement and breast care;Outpatient  recommendation;Warning signs for feeding baby ?Pump: Personal (Spectra) ? ?Reviewed anticipated breast changes, breast fullness and engorgement and management of this along with nipple care/healing. Outpatient lactation information given and parents encouraged to utilize as needed. ? ?Consult Status ?Consult Status: Complete ? ? ? ?Lavonia Drafts ?11/25/2021, 9:57 AM ? ? ? ?

## 2021-11-25 NOTE — Progress Notes (Signed)
Patient discharged with infant. Discharge instructions, prescriptions, and follow up appointments given to and reviewed with patient. Patient verbalized understanding. Will be escorted out by axillary.  °

## 2021-11-27 LAB — ABO/RH: ABO/RH(D): A POS

## 2021-11-28 ENCOUNTER — Other Ambulatory Visit: Payer: Self-pay | Admitting: Pharmacist

## 2021-12-04 ENCOUNTER — Ambulatory Visit (INDEPENDENT_AMBULATORY_CARE_PROVIDER_SITE_OTHER): Payer: BC Managed Care – PPO | Admitting: Obstetrics & Gynecology

## 2021-12-04 ENCOUNTER — Encounter: Payer: Self-pay | Admitting: Obstetrics & Gynecology

## 2021-12-04 ENCOUNTER — Other Ambulatory Visit: Payer: Self-pay

## 2021-12-04 VITALS — BP 120/68 | HR 76 | Ht 61.0 in | Wt 172.0 lb

## 2021-12-04 DIAGNOSIS — O165 Unspecified maternal hypertension, complicating the puerperium: Secondary | ICD-10-CM | POA: Diagnosis not present

## 2021-12-04 MED ORDER — METOCLOPRAMIDE HCL 10 MG PO TABS: 10.0000 mg | ORAL_TABLET | Freq: Four times a day (QID) | ORAL | 5 refills | Status: DC | PRN

## 2021-12-04 NOTE — Progress Notes (Signed)
? ? ? ?Obstetrics & Gynecology Office Visit  ? ?Chief Complaint  ?Patient presents with  ? Hypertension  ?  Follow up  ? ?History of Present Illness: 31 y.o. G1P1001 being seen for follow up blood pressure check today.  The patient is  postpartum 11 days. The established diagnosis for the patient is gestational hypertension.  She is currently on labetalol '200mg'$ .  She reports no current symptoms attributable to her blood pressure.  Medication list reviewed medications which may contribute to BP elevation were not noted. ? ?Past Medical History:  ?Past Medical History:  ?Diagnosis Date  ? Anemia   ? Anxiety   ? Depression   ? Uterine adenomyoma   ? JUVENILE CYSTIC  ? ? ?Past Surgical History:  ?Past Surgical History:  ?Procedure Laterality Date  ? GASTRIC ROUX-EN-Y    ? LAPAROSCOPIC GASTRIC BYPASS  02/24/2017  ? WISDOM TOOTH EXTRACTION    ? ? ?Gynecologic History: Patient's last menstrual period was 11/23/2021. ? ?Obstetric History: G1P0000 ? ?Family History:  ?Family History  ?Problem Relation Age of Onset  ? Cervical cancer Mother 64  ?     total hysterectomy  ? ? ?Social History:  ?Social History  ? ?Socioeconomic History  ? Marital status: Married  ?  Spouse name: FRANCOIS  ? Number of children: 0  ? Years of education: 62 + 4  ? Highest education level: Not on file  ?Occupational History  ? Occupation: THIRD GRADE SCHOOL TEACHER  ?Tobacco Use  ? Smoking status: Never  ? Smokeless tobacco: Never  ?Vaping Use  ? Vaping Use: Never used  ?Substance and Sexual Activity  ? Alcohol use: No  ? Drug use: No  ? Sexual activity: Yes  ?  Partners: Male  ?Other Topics Concern  ? Not on file  ?Social History Narrative  ? Not on file  ? ?Social Determinants of Health  ? ?Financial Resource Strain: Not on file  ?Food Insecurity: Not on file  ?Transportation Needs: Not on file  ?Physical Activity: Not on file  ?Stress: Not on file  ?Social Connections: Not on file  ?Intimate Partner Violence: Not on file  ? ? ?Allergies:   ?Allergies  ?Allergen Reactions  ? Nsaids   ?  Patient has history of gastric bypass.    ? ? ?Medications: ?Prior to Admission medications   ?Medication Sig Start Date End Date Taking? Authorizing Provider  ?labetalol (NORMODYNE) 200 MG tablet Take 1 tablet (200 mg total) by mouth 2 (two) times daily. 11/25/21  Yes Rod Can, CNM  ?metoCLOPramide (REGLAN) 10 MG tablet Take 1 tablet (10 mg total) by mouth 4 (four) times daily as needed (breast milk supply). 12/04/21  Yes Gae Dry, MD  ?Prenatal MV & Min w/FA-DHA (PRENATAL GUMMIES PO) Take by mouth.   Yes [provider]  ?sertraline (ZOLOFT) 25 MG tablet Take 1 tablet (25 mg total) by mouth daily for 7 days, THEN 2 tablets (50 mg total) daily. 10/22/21 11/28/21  DominicNunzio Cobbs, CNM  ? ? ?Review of Systems  ?All other systems reviewed and are negative. ? ?Physical Exam ?Blood pressure 120/68, pulse 76, height '5\' 1"'$  (1.549 m), weight 172 lb (78 kg), last menstrual period 11/23/2021, currently breastfeeding. ? ?Patient's last menstrual period was 11/23/2021. ? ?General: NAD ?HEENT: normocephalic, anicteric ?Pulmonary: No increased work of breathing ?Cardiovascular: RRR, distal pulses 2+ ?Extremities: noedema, no erythema, no tenderness ?Neurologic: Grossly intact ?Psychiatric: mood appropriate, affect full ? ?Assessment: 31 y.o. G1P0000 presenting for blood  pressure evaluation today ? ?Plan: ?Problem List Items Addressed This Visit   ? ? Hypertension, postpartum condition or complication    -  Primary  ? ?  ?1) Blood pressure - blood pressure at today's visit is normotensive.  As a result  she will continue Labetalol and consider tapering off in 6 weeks . ?- additional blood work was not obtained ? ?2) Reglan for breast milk supply ? ?3) No s/sx PPD ?Ed[place] 3 ? ?Barnett Applebaum, MD, FACOG ?[facility], [place] ?12/04/2021  9:13 AM ? ?

## 2021-12-09 ENCOUNTER — Encounter: Payer: Self-pay | Admitting: Obstetrics & Gynecology

## 2021-12-09 ENCOUNTER — Other Ambulatory Visit: Payer: Self-pay | Admitting: Obstetrics & Gynecology

## 2021-12-09 MED ORDER — METFORMIN HCL 500 MG PO TABS: ORAL_TABLET | ORAL | 11 refills | Status: DC

## 2021-12-26 ENCOUNTER — Encounter: Payer: Self-pay | Admitting: Oncology

## 2021-12-26 ENCOUNTER — Other Ambulatory Visit: Payer: Self-pay | Admitting: Pharmacist

## 2021-12-30 ENCOUNTER — Inpatient Hospital Stay: Payer: BC Managed Care – PPO | Attending: Oncology

## 2021-12-30 VITALS — BP 104/52 | HR 68 | Resp 18

## 2021-12-30 DIAGNOSIS — D509 Iron deficiency anemia, unspecified: Secondary | ICD-10-CM | POA: Insufficient documentation

## 2021-12-30 DIAGNOSIS — D508 Other iron deficiency anemias: Secondary | ICD-10-CM

## 2021-12-30 DIAGNOSIS — E538 Deficiency of other specified B group vitamins: Secondary | ICD-10-CM | POA: Diagnosis present

## 2021-12-30 MED ORDER — SODIUM CHLORIDE 0.9 % IV SOLN
200.0000 mg | Freq: Once | INTRAVENOUS | Status: AC
  Administered 2021-12-30: 200 mg via INTRAVENOUS
  Filled 2021-12-30: qty 200

## 2021-12-30 MED ORDER — CYANOCOBALAMIN 1000 MCG/ML IJ SOLN
1000.0000 ug | Freq: Once | INTRAMUSCULAR | Status: AC
  Administered 2021-12-30: 1000 ug via INTRAMUSCULAR
  Filled 2021-12-30: qty 1

## 2021-12-30 MED ORDER — SODIUM CHLORIDE 0.9 % IV SOLN: Freq: Once | INTRAVENOUS | Status: AC

## 2021-12-30 NOTE — Patient Instructions (Signed)

## 2021-12-31 ENCOUNTER — Other Ambulatory Visit: Payer: Self-pay

## 2021-12-31 ENCOUNTER — Inpatient Hospital Stay: Payer: BC Managed Care – PPO

## 2021-12-31 VITALS — BP 114/60 | HR 61 | Temp 98.2°F | Resp 18 | Ht 61.75 in | Wt 176.0 lb

## 2021-12-31 DIAGNOSIS — D508 Other iron deficiency anemias: Secondary | ICD-10-CM

## 2021-12-31 DIAGNOSIS — D509 Iron deficiency anemia, unspecified: Secondary | ICD-10-CM | POA: Diagnosis not present

## 2021-12-31 MED ORDER — SODIUM CHLORIDE 0.9 % IV SOLN: Freq: Once | INTRAVENOUS | Status: AC

## 2021-12-31 MED ORDER — SODIUM CHLORIDE 0.9 % IV SOLN
200.0000 mg | Freq: Once | INTRAVENOUS | Status: AC
  Administered 2021-12-31: 200 mg via INTRAVENOUS
  Filled 2021-12-31: qty 200

## 2021-12-31 NOTE — Patient Instructions (Signed)

## 2022-01-01 ENCOUNTER — Inpatient Hospital Stay: Payer: BC Managed Care – PPO

## 2022-01-01 VITALS — BP 117/63 | HR 57 | Temp 98.0°F | Resp 18 | Wt 173.0 lb

## 2022-01-01 DIAGNOSIS — D509 Iron deficiency anemia, unspecified: Secondary | ICD-10-CM | POA: Diagnosis not present

## 2022-01-01 DIAGNOSIS — D508 Other iron deficiency anemias: Secondary | ICD-10-CM

## 2022-01-01 MED ORDER — SODIUM CHLORIDE 0.9 % IV SOLN
200.0000 mg | Freq: Once | INTRAVENOUS | Status: AC
  Administered 2022-01-01: 200 mg via INTRAVENOUS
  Filled 2022-01-01: qty 200

## 2022-01-01 MED ORDER — SODIUM CHLORIDE 0.9 % IV SOLN: Freq: Once | INTRAVENOUS | Status: AC

## 2022-01-01 NOTE — Patient Instructions (Signed)

## 2022-01-02 ENCOUNTER — Ambulatory Visit: Payer: BC Managed Care – PPO

## 2022-01-02 ENCOUNTER — Other Ambulatory Visit: Payer: Self-pay | Admitting: Pharmacist

## 2022-01-03 ENCOUNTER — Ambulatory Visit: Payer: BC Managed Care – PPO

## 2022-01-08 ENCOUNTER — Inpatient Hospital Stay: Payer: BC Managed Care – PPO | Attending: Oncology

## 2022-01-08 ENCOUNTER — Other Ambulatory Visit: Payer: Self-pay

## 2022-01-08 VITALS — BP 102/58 | HR 58 | Temp 98.7°F | Resp 18 | Wt 167.8 lb

## 2022-01-08 DIAGNOSIS — D508 Other iron deficiency anemias: Secondary | ICD-10-CM

## 2022-01-08 DIAGNOSIS — D509 Iron deficiency anemia, unspecified: Secondary | ICD-10-CM | POA: Insufficient documentation

## 2022-01-08 DIAGNOSIS — E538 Deficiency of other specified B group vitamins: Secondary | ICD-10-CM | POA: Diagnosis not present

## 2022-01-08 DIAGNOSIS — Z79899 Other long term (current) drug therapy: Secondary | ICD-10-CM | POA: Insufficient documentation

## 2022-01-08 MED ORDER — SODIUM CHLORIDE 0.9 % IV SOLN: Freq: Once | INTRAVENOUS | Status: AC

## 2022-01-08 MED ORDER — SODIUM CHLORIDE 0.9 % IV SOLN
200.0000 mg | Freq: Once | INTRAVENOUS | Status: AC
  Administered 2022-01-08: 200 mg via INTRAVENOUS
  Filled 2022-01-08: qty 200

## 2022-01-08 NOTE — Patient Instructions (Signed)

## 2022-01-08 NOTE — Progress Notes (Signed)
1245:PT STABLE AT TIME OF DISCHARGE ?

## 2022-01-09 ENCOUNTER — Encounter: Payer: Self-pay | Admitting: Advanced Practice Midwife

## 2022-01-09 ENCOUNTER — Ambulatory Visit (INDEPENDENT_AMBULATORY_CARE_PROVIDER_SITE_OTHER): Payer: BC Managed Care – PPO | Admitting: Advanced Practice Midwife

## 2022-01-09 NOTE — Progress Notes (Signed)
? ? ? ?Postpartum Visit  ?Chief Complaint:  ?Chief Complaint  ?Patient presents with  ? Postpartum Care  ? ? ?History of Present Illness: Patient is a 31 y.o. G1P0000 presents for postpartum visit. ? ?Review the Delivery Report for details.  ? ?Date of delivery: 11/23/2021 ?Type of delivery: Vaginal delivery - Vacuum or forceps assisted  no ?Episiotomy No.  ?Laceration: 2nd degree  ?Pregnancy or labor problems:  history gastric bypass, ART, mild preeclampsia ?Any problems since the delivery:  low milk supply ? ?Newborn Details:  ?SINGLETON :  ?1. BabyGender female. Birth weight: 3310 g ?Maternal Details:  ?Breast or formula feeding: formula feeding ?Intercourse: Yes  ?Contraception after delivery:  condoms , the couple plans a second pregnancy in about 9 months ?Any bowel or bladder issues: No  ?Post partum depression/anxiety noted:  no ?Edinburgh Post-Partum Depression Score: 4 ?Date of last PAP: 03/27/21  no abnormalities  ? ?Review of Systems: Review of Systems  ?Constitutional:  Negative for chills and fever.  ?HENT:  Negative for congestion, ear discharge, ear pain, hearing loss, sinus pain and sore throat.   ?Eyes:  Negative for blurred vision and double vision.  ?Respiratory:  Negative for cough, shortness of breath and wheezing.   ?Cardiovascular:  Negative for chest pain, palpitations and leg swelling.  ?Gastrointestinal:  Negative for abdominal pain, blood in stool, constipation, diarrhea, heartburn, melena, nausea and vomiting.  ?Genitourinary:  Negative for dysuria, flank pain, frequency, hematuria and urgency.  ?Musculoskeletal:  Negative for back pain, joint pain and myalgias.  ?Skin:  Negative for itching and rash.  ?Neurological:  Negative for dizziness, tingling, tremors, sensory change, speech change, focal weakness, seizures, loss of consciousness, weakness and headaches.  ?Endo/Heme/Allergies:  Negative for environmental allergies. Does not bruise/bleed easily.  ?Psychiatric/Behavioral:  Negative  for depression, hallucinations, memory loss, substance abuse and suicidal ideas. The patient is not nervous/anxious and does not have insomnia.   ? ? ?Past Medical History:  ?Past Medical History:  ?Diagnosis Date  ? Anemia   ? Anxiety   ? Depression   ? Uterine adenomyoma   ? JUVENILE CYSTIC  ? ? ?Past Surgical History:  ?Past Surgical History:  ?Procedure Laterality Date  ? GASTRIC ROUX-EN-Y    ? LAPAROSCOPIC GASTRIC BYPASS  02/24/2017  ? WISDOM TOOTH EXTRACTION    ? ? ?Family History:  ?Family History  ?Problem Relation Age of Onset  ? Cervical cancer Mother 73  ?     total hysterectomy  ? ? ?Social History:  ?Social History  ? ?Socioeconomic History  ? Marital status: Married  ?  Spouse name: FRANCOIS  ? Number of children: 0  ? Years of education: 81 + 4  ? Highest education level: Not on file  ?Occupational History  ? Occupation: THIRD GRADE SCHOOL TEACHER  ?Tobacco Use  ? Smoking status: Never  ? Smokeless tobacco: Never  ?Vaping Use  ? Vaping Use: Never used  ?Substance and Sexual Activity  ? Alcohol use: No  ? Drug use: No  ? Sexual activity: Yes  ?  Partners: Male  ?Other Topics Concern  ? Not on file  ?Social History Narrative  ? Not on file  ? ?Social Determinants of Health  ? ?Financial Resource Strain: Not on file  ?Food Insecurity: Not on file  ?Transportation Needs: Not on file  ?Physical Activity: Not on file  ?Stress: Not on file  ?Social Connections: Not on file  ?Intimate Partner Violence: Not on file  ? ? ?Allergies:  ?  Allergies  ?Allergen Reactions  ? Nsaids   ?  Patient has history of gastric bypass.    ? ? ?Medications: ?Prior to Admission medications   ?Medication Sig Start Date End Date Taking? Authorizing Provider  ?Prenatal MV & Min w/FA-DHA (PRENATAL GUMMIES PO) Take by mouth.   Yes [provider]  ?metFORMIN (GLUCOPHAGE) 500 MG tablet Take one tablet by mouth daily for one week. Then increase to one tablet twice a day for one week.  Then two tablets twice a day. ?Patient not  taking: Reported on 01/09/2022 12/09/21   Gae Dry, MD  ?metoCLOPramide (REGLAN) 10 MG tablet Take 1 tablet (10 mg total) by mouth 4 (four) times daily as needed (breast milk supply). ?Patient not taking: Reported on 01/09/2022 12/04/21   Gae Dry, MD  ?sertraline (ZOLOFT) 25 MG tablet Take 1 tablet (25 mg total) by mouth daily for 7 days, THEN 2 tablets (50 mg total) daily. 10/22/21 11/28/21  DominicNunzio Cobbs, CNM  ? ? ?Physical Exam ?Blood pressure 120/80, height '5\' 1"'$  (1.549 m), weight 174 lb (78.9 kg) ?  ? ?General: NAD ?HEENT: normocephalic, anicteric ?Pulmonary: No increased work of breathing ?Abdomen: NABS, soft, non-tender, non-distended.  Umbilicus without lesions.  No hepatomegaly, splenomegaly or masses palpable. No evidence of hernia. ?Genitourinary: ? External: Normal external female genitalia.  Normal urethral meatus, normal Bartholin's and Skene's glands.   ? Vagina: Normal vaginal mucosa, no evidence of prolapse.   ? Cervix: Grossly normal in appearance, no bleeding ? Uterus: Non-enlarged, mobile, normal contour.  No CMT ? Adnexa: ovaries non-enlarged, no adnexal masses ? Rectal: deferred ?Extremities: no edema, erythema, or tenderness ?Neurologic: Grossly intact ?Psychiatric: mood appropriate, affect full ? ? Edinburgh Postnatal Depression Scale - 01/09/22 1611   ? ?  ? Edinburgh Postnatal Depression Scale:  In the Past 7 Days  ? I have been able to laugh and see the funny side of things. 0   ? I have looked forward with enjoyment to things. 0   ? I have blamed myself unnecessarily when things went wrong. 1   ? I have been anxious or worried for no good reason. 1   ? I have felt scared or panicky for no good reason. 0   ? Things have been getting on top of me. 1   ? I have been so unhappy that I have had difficulty sleeping. 0   ? I have felt sad or miserable. 0   ? I have been so unhappy that I have been crying. 1   ? The thought of harming myself has occurred to me. 0   ? Edinburgh  Postnatal Depression Scale Total 4   ? ?  ?  ? ?  ? ? ?Assessment: 31 y.o. G1P0000 presenting for 6 week postpartum visit ? ?Plan: ?Problem List Items Addressed This Visit   ?None ?Visit Diagnoses   ? ? 6 weeks postpartum follow-up    -  Primary  ? ?  ? ? ? ?1) Contraception ?- Education given regarding options for contraception, as well as compatibility with breast feeding if applicable.  Patient plans on condoms for contraception. ? ?2)  Pap ?- ASCCP guidelines and rationale discussed.  Patient opts for every 3 years screening interval ? ?3) Patient underwent screening for postpartum depression with no signs of depression ? ?4) Return in about 1 year (around 01/10/2023) for annual established gyn. ? ? ?Rod Can, CNM ?[facility] ?[facility] ?01/09/2022, 4:45  PM ? ? ?  ?

## 2022-01-10 ENCOUNTER — Inpatient Hospital Stay: Payer: BC Managed Care – PPO

## 2022-01-10 VITALS — BP 117/67 | HR 53 | Resp 18 | Wt 166.1 lb

## 2022-01-10 DIAGNOSIS — D508 Other iron deficiency anemias: Secondary | ICD-10-CM

## 2022-01-10 DIAGNOSIS — D509 Iron deficiency anemia, unspecified: Secondary | ICD-10-CM | POA: Diagnosis not present

## 2022-01-10 MED ORDER — SODIUM CHLORIDE 0.9 % IV SOLN
200.0000 mg | Freq: Once | INTRAVENOUS | Status: AC
  Administered 2022-01-10: 200 mg via INTRAVENOUS
  Filled 2022-01-10: qty 200

## 2022-01-10 MED ORDER — SODIUM CHLORIDE 0.9 % IV SOLN: Freq: Once | INTRAVENOUS | Status: AC

## 2022-01-10 NOTE — Patient Instructions (Signed)
Iron Sucrose Injection ?Quel est ce m?dicament ? ?Le FER SACCHAROSE traite les faibles taux de fer (an?mie ferriprive) chez les personnes atteintes d?une maladie r?nale. Le fer est un min?ral qui joue un r?le important dans la production des globules rouges qui transportent l?oxyg?ne des poumons vers les autres organes. ?Ce m?dicament peut ?tre utilis? pour d'autres indications ; adressez-vous ? votre m?decin ou ? votre pharmacien si vous [employer] questions. ?NOM(S) DE MARQUE COURANT(S) : Venofer ?Que dois-je dire ? mon fournisseur de Associate Professor de prendre ce m?dicament ? ?Ils ont besoin de savoir si vous pr?sentez l?une quelconque des affections ou situations suivantes : ?An?mie non due ? de faibles taux de fer ?Maladie cardiaque ?Taux ?lev?s de [employer] sang ?Maladie r?nale ?[employer] ?R?action inhabituelle ou allergique au fer, ? d?autres m?dicaments, ? des aliments, ? des colorants ou ? des conservateurs ?Grossesse ou projet de [place] ?Allaitement ?Comment dois-je utiliser ce m?dicament ? ?Ce m?dicament est destin? ? ?tre administr? par perfusion dans une veine. Il est administr? dans un ?tablissement Development worker, international aid m?dical. ?Pour toute information relative ? l?utilisation de ce m?dicament chez l?enfant, consultez votre ?quipe de soins. Bien que ce m?dicament puisse ?tre prescrit, pour certaines affections, ? des enfants ?g?s de 2 ans seulement, des pr?cautions s'imposent. ?Surdosage : si vous pensez avoir pris ce m?dicament en exc?s, contactez imm?diatement un centre [employer]. ?REMARQUE : ce m?dicament vous est uniquement destin?. Ne partagez pas ce m?dicament [employer]. ?Que faire si je saute une dose ? ?Il est important de ne pas sauter de dose. Appelez votre ?quipe de soins si vous ne pouvez pas aller ? Pharmacist, hospital. ?Quelles sont les interactions possibles avec ce m?dicament ? ?Ne prenez pas ce m?dicament avec l'une quelconque des  substances suivantes : ?D?f?roxamine ?Dimercaprol ?Autres produits ? base de fer Ce m?dicament peut ?galement interagir avec les m?dicaments suivants : ?Chloramph?nicol ?D?f?rasirox ?Cette liste peut ne pas d?crire toutes les interactions m?dicamenteuses possibles. Donnez ? votre fournisseur de [employer] de tous les m?dicaments, plantes m?dicinales, m?dicaments en vente libre ou compl?ments alimentaires que vous prenez. Dites-lui aussi si vous fumez, buvez de l'alcool ou consommez des drogues illicites. Certaines substances peuvent interagir avec votre m?dicament. ?Que Research officer, political party par ce m?dicament ? ?Prenez rendez-vous r?guli?rement avec votre ?quipe de soins. Informez votre ?quipe de soins si vos sympt?mes ne commencent pas ? r?gresser ou s?ils s?aggravent. Vous devrez [employer] de sang r?guli?res [employer] par ce m?dicament. ?Vous devrez ?ventuellement suivre un r?gime alimentaire sp?cial. Consultez votre ?quipe de soins. Les aliments qui contiennent du fer comprennent : les c?r?ales compl?tes, les fruits secs, les haricots, les pois, les l?gumes verts ? feuilles et les abats [employer], rein). ?Quels effets secondaires puis-je remarquer en prenant ce m?dicament ? ?Effets secondaires que vous devez signaler ? votre ?quipe de soins d?s que possible : ?R?actions allergiques : ?ruption cutan?e, d?mangeaisons, urticaire, gonflement du visage, des l?vres, de la langue ou de la gorge ?Hypotension art?rielle : vertiges, sensation d??vanouissement ou d??tourdissement, vision trouble ?Essoufflements Effets secondaires ne n?cessitant g?n?ralement pas un avis m?dical (signalez-les ? votre ?quipe de soins s?ils persistent ou sont g?nants) : ?Bouff?es congestives ?Mal de t?te ?Douleurs articulaires ?Douleurs musculaires ?Naus?es ?Douleur, rougeur ou irritation au site d?injection ?Cette liste peut ne pas d?crire tous les effets secondaires possibles. Appelez votre m?decin  pour [employer] sujet des [employer]. Vous pouvez signaler les effets secondaires ?  la FDA, au [phone]. ?O? dois-je conserver mon m?dicament ? ?Ce m?dicament est administr? par un professionnel de sant? dans un h?pital ou [employer]. Vous ne recevrez pas ce m?dicament pour [employer] vous. ?REMARQUE : cette notice est un r?sum?Evert Kohl peut ne pas contenir toutes les informations possibles. Si vous avez des questions ? propos de ce m?dicament, consultez votre m?decin, votre pharmacien ou votre fournisseur de [place]. ?? 2023 Elsevier/Gold Standard (2021-03-29 00:00:00) ? ?

## 2022-01-27 ENCOUNTER — Inpatient Hospital Stay: Payer: BC Managed Care – PPO

## 2022-01-27 VITALS — BP 118/77 | HR 53 | Resp 18

## 2022-01-27 DIAGNOSIS — D509 Iron deficiency anemia, unspecified: Secondary | ICD-10-CM | POA: Diagnosis not present

## 2022-01-27 DIAGNOSIS — D508 Other iron deficiency anemias: Secondary | ICD-10-CM

## 2022-01-27 MED ORDER — CYANOCOBALAMIN 1000 MCG/ML IJ SOLN
1000.0000 ug | Freq: Once | INTRAMUSCULAR | Status: AC
  Administered 2022-01-27: 1000 ug via INTRAMUSCULAR
  Filled 2022-01-27: qty 1

## 2022-01-27 NOTE — Patient Instructions (Signed)
Vitamin B12 Injection What is this medication? Vitamin B12 (VAHY tuh min B12) prevents and treats low vitamin B12 levels in your body. It is used in people who do not get enough vitamin B12 from their diet or when their digestive tract does not absorb enough. Vitamin B12 plays an important role in maintaining the health of your nervous system and red blood cells. This medicine may be used for other purposes; ask your health care provider or pharmacist if you have questions. COMMON BRAND NAME(S): B-12 Compliance Kit, B-12 Injection Kit, Cyomin, Dodex, LA-12, Nutri-Twelve, Physicians EZ Use B-12, Primabalt What should I tell my care team before I take this medication? They need to know if you have any of these conditions: Kidney disease Leber's disease Megaloblastic anemia An unusual or allergic reaction to cyanocobalamin, cobalt, other medications, foods, dyes, or preservatives Pregnant or trying to get pregnant Breast-feeding How should I use this medication? This medication is injected into a muscle or deeply under the skin. It is usually given in a clinic or care team's office. However, your care team may teach you how to inject yourself. Follow all instructions. Talk to your care team about the use of this medication in children. Special care may be needed. Overdosage: If you think you have taken too much of this medicine contact a poison control center or emergency room at once. NOTE: This medicine is only for you. Do not share this medicine with others. What if I miss a dose? If you are given your dose at a clinic or care team's office, call to reschedule your appointment. If you give your own injections, and you miss a dose, take it as soon as you can. If it is almost time for your next dose, take only that dose. Do not take double or extra doses. What may interact with this medication? Alcohol Colchicine This list may not describe all possible interactions. Give your health care  provider a list of all the medicines, herbs, non-prescription drugs, or dietary supplements you use. Also tell them if you smoke, drink alcohol, or use illegal drugs. Some items may interact with your medicine. What should I watch for while using this medication? Visit your care team regularly. You may need blood work done while you are taking this medication. You may need to follow a special diet. Talk to your care team. Limit your alcohol intake and avoid smoking to get the best benefit. What side effects may I notice from receiving this medication? Side effects that you should report to your care team as soon as possible: Allergic reactions--skin rash, itching, hives, swelling of the face, lips, tongue, or throat Swelling of the ankles, hands, or feet Trouble breathing Side effects that usually do not require medical attention (report to your care team if they continue or are bothersome): Diarrhea This list may not describe all possible side effects. Call your doctor for medical advice about side effects. You may report side effects to FDA at 1-800-FDA-1088. Where should I keep my medication? Keep out of the reach of children. Store at room temperature between 15 and 30 degrees C (59 and 85 degrees F). Protect from light. Throw away any unused medication after the expiration date. NOTE: This sheet is a summary. It may not cover all possible information. If you have questions about this medicine, talk to your doctor, pharmacist, or health care provider.  2023 Elsevier/Gold Standard (2021-05-07 00:00:00)

## 2022-02-07 ENCOUNTER — Ambulatory Visit: Payer: BC Managed Care – PPO | Admitting: Oncology

## 2022-02-07 ENCOUNTER — Other Ambulatory Visit: Payer: BC Managed Care – PPO

## 2022-02-13 NOTE — Progress Notes (Signed)
[facility]  [address] [place],  [place]  [address] [phone]  Clinic Day:  06/09./2023  Referring physician: Philmore Pali, NP  HISTORY OF PRESENT ILLNESS:  The patient is a 31 y.o. female  with iron deficiency anemia.  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  She also had gastric bypass surgery a few years ago.   She comes in today to reassess her labs after recently receiving IV iron in April 2023.  Of note, his IV iron was given that she gave birth to her first child.  Overall, the patient has felt better since her IV iron was given.  She denies having increased fatigue or any overt forms of blood loss which concern her for persistent iron deficiency anemia.  PHYSICAL EXAM:  Blood pressure 119/81, pulse 64, temperature 98.1 F (36.7 C), resp. rate 16, height 5' 1.75" (1.568 m), weight 168 lb (76.2 kg), SpO2 96 %. Wt Readings from Last 3 Encounters:  02/14/22 168 lb (76.2 kg)  01/10/22 166 lb 1.9 oz (75.4 kg)  01/09/22 174 lb (78.9 kg)   Body mass index is 30.98 kg/m. Performance status (ECOG): 0 - Asymptomatic Physical Exam Constitutional:      Appearance: Normal appearance. She is not ill-appearing.  HENT:     Mouth/Throat:     Mouth: Mucous membranes are moist.     Pharynx: Oropharynx is clear. No oropharyngeal exudate or posterior oropharyngeal erythema.  Cardiovascular:     Rate and Rhythm: Normal rate and regular rhythm.     Heart sounds: No murmur heard.    No friction rub. No gallop.  Pulmonary:     Effort: Pulmonary effort is normal. No respiratory distress.     Breath sounds: Normal breath sounds. No wheezing, rhonchi or rales.  Abdominal:     General: Bowel sounds are normal. There is no distension.     Palpations: Abdomen is soft. There is no mass.     Tenderness: There is no abdominal tenderness.  Musculoskeletal:        General: No swelling.     Right  lower leg: No edema.     Left lower leg: No edema.  Lymphadenopathy:     Cervical: No cervical adenopathy.     Upper Body:     Right upper body: No supraclavicular or axillary adenopathy.     Left upper body: No supraclavicular or axillary adenopathy.     Lower Body: No right inguinal adenopathy. No left inguinal adenopathy.  Skin:    General: Skin is warm.     Coloration: Skin is not jaundiced.     Findings: No lesion or rash.  Neurological:     General: No focal deficit present.     Mental Status: She is alert and oriented to person, place, and time. Mental status is at baseline.  Psychiatric:        Mood and Affect: Mood normal.        Behavior: Behavior normal.        Thought Content: Thought content normal.    LABS:    Latest Reference Range & Units 02/14/22 00:00  WBC  5.1 (E)  RBC 3.87 - 5.11  4.81 (E)  Hemoglobin 12.0 - 16.0  12.7 (E)  HCT 36 - 46  39 (E)  Platelets 150 - 400 K/uL 263 (E)  (E): External lab result    Latest Reference Range & Units 02/14/22  16:29  Iron 28 - 170 ug/dL 81  UIBC ug/dL 203  TIBC 250 - 450 ug/dL 284  Saturation Ratios 10.4 - 31.8 % 29  Ferritin 11 - 307 ng/mL 167   ASSESSMENT & PLAN:  A 31 y.o. female with iron deficiency anemia, likely related to her previous gastric bypass surgery.  As mentioned previously, she just gave birth to her first child.  Her pregnancy also led to increased iron demands which likely precipitated her iron deficiency anemia.  When evaluating her labs today, her iron levels have clearly improved since receiving her IV iron.  Her hemoglobin remains at ideal levels.    Clinically, this patient is doing very well.  As that is the case, I will see her back in 6 months for repeat clinical assessment.  The patient understands all the plans discussed today and is in agreement with them.   Wendy Manganiello Macarthur Critchley, MD

## 2022-02-14 ENCOUNTER — Inpatient Hospital Stay: Payer: BC Managed Care – PPO | Attending: Oncology | Admitting: Oncology

## 2022-02-14 ENCOUNTER — Other Ambulatory Visit: Payer: Self-pay | Admitting: Oncology

## 2022-02-14 ENCOUNTER — Inpatient Hospital Stay: Payer: BC Managed Care – PPO

## 2022-02-14 VITALS — BP 119/81 | HR 64 | Temp 98.1°F | Resp 16 | Ht 61.75 in | Wt 168.0 lb

## 2022-02-14 DIAGNOSIS — D508 Other iron deficiency anemias: Secondary | ICD-10-CM | POA: Diagnosis not present

## 2022-02-14 DIAGNOSIS — Z9884 Bariatric surgery status: Secondary | ICD-10-CM | POA: Diagnosis not present

## 2022-02-14 DIAGNOSIS — D509 Iron deficiency anemia, unspecified: Secondary | ICD-10-CM | POA: Insufficient documentation

## 2022-02-14 LAB — IRON AND TIBC
Iron: 81 ug/dL (ref 28–170)
Saturation Ratios: 29 % (ref 10.4–31.8)
TIBC: 284 ug/dL (ref 250–450)
UIBC: 203 ug/dL

## 2022-02-14 LAB — FERRITIN: Ferritin: 167 ng/mL (ref 11–307)

## 2022-02-14 LAB — CBC AND DIFFERENTIAL
HCT: 39 (ref 36–46)
Hemoglobin: 12.7 (ref 12.0–16.0)
Neutrophils Absolute: 2.91
Platelets: 263 10*3/uL (ref 150–400)
WBC: 5.1

## 2022-02-14 LAB — CBC: RBC: 4.81 (ref 3.87–5.11)

## 2022-02-19 ENCOUNTER — Other Ambulatory Visit: Payer: Self-pay | Admitting: Oncology

## 2022-02-19 ENCOUNTER — Encounter: Payer: Self-pay | Admitting: Oncology

## 2022-02-19 DIAGNOSIS — D508 Other iron deficiency anemias: Secondary | ICD-10-CM

## 2022-02-19 DIAGNOSIS — E538 Deficiency of other specified B group vitamins: Secondary | ICD-10-CM

## 2022-02-24 ENCOUNTER — Inpatient Hospital Stay: Payer: BC Managed Care – PPO

## 2022-02-24 VITALS — BP 122/89 | HR 61 | Temp 97.7°F | Resp 18 | Ht 61.75 in | Wt 165.0 lb

## 2022-02-24 DIAGNOSIS — D509 Iron deficiency anemia, unspecified: Secondary | ICD-10-CM | POA: Diagnosis not present

## 2022-02-24 DIAGNOSIS — D508 Other iron deficiency anemias: Secondary | ICD-10-CM

## 2022-02-24 MED ORDER — CYANOCOBALAMIN 1000 MCG/ML IJ SOLN
1000.0000 ug | Freq: Once | INTRAMUSCULAR | Status: AC
  Administered 2022-02-24: 1000 ug via INTRAMUSCULAR
  Filled 2022-02-24: qty 1

## 2022-02-24 NOTE — Patient Instructions (Signed)
Vitamin B12 Injection What is this medication? Vitamin B12 (VAHY tuh min B12) prevents and treats low vitamin B12 levels in your body. It is used in people who do not get enough vitamin B12 from their diet or when their digestive tract does not absorb enough. Vitamin B12 plays an important role in maintaining the health of your nervous system and red blood cells. This medicine may be used for other purposes; ask your health care provider or pharmacist if you have questions. COMMON BRAND NAME(S): B-12 Compliance Kit, B-12 Injection Kit, Cyomin, Dodex, LA-12, Nutri-Twelve, Physicians EZ Use B-12, Primabalt What should I tell my care team before I take this medication? They need to know if you have any of these conditions: Kidney disease Leber's disease Megaloblastic anemia An unusual or allergic reaction to cyanocobalamin, cobalt, other medications, foods, dyes, or preservatives Pregnant or trying to get pregnant Breast-feeding How should I use this medication? This medication is injected into a muscle or deeply under the skin. It is usually given in a clinic or care team's office. However, your care team may teach you how to inject yourself. Follow all instructions. Talk to your care team about the use of this medication in children. Special care may be needed. Overdosage: If you think you have taken too much of this medicine contact a poison control center or emergency room at once. NOTE: This medicine is only for you. Do not share this medicine with others. What if I miss a dose? If you are given your dose at a clinic or care team's office, call to reschedule your appointment. If you give your own injections, and you miss a dose, take it as soon as you can. If it is almost time for your next dose, take only that dose. Do not take double or extra doses. What may interact with this medication? Alcohol Colchicine This list may not describe all possible interactions. Give your health care  provider a list of all the medicines, herbs, non-prescription drugs, or dietary supplements you use. Also tell them if you smoke, drink alcohol, or use illegal drugs. Some items may interact with your medicine. What should I watch for while using this medication? Visit your care team regularly. You may need blood work done while you are taking this medication. You may need to follow a special diet. Talk to your care team. Limit your alcohol intake and avoid smoking to get the best benefit. What side effects may I notice from receiving this medication? Side effects that you should report to your care team as soon as possible: Allergic reactions--skin rash, itching, hives, swelling of the face, lips, tongue, or throat Swelling of the ankles, hands, or feet Trouble breathing Side effects that usually do not require medical attention (report to your care team if they continue or are bothersome): Diarrhea This list may not describe all possible side effects. Call your doctor for medical advice about side effects. You may report side effects to FDA at 1-800-FDA-1088. Where should I keep my medication? Keep out of the reach of children. Store at room temperature between 15 and 30 degrees C (59 and 85 degrees F). Protect from light. Throw away any unused medication after the expiration date. NOTE: This sheet is a summary. It may not cover all possible information. If you have questions about this medicine, talk to your doctor, pharmacist, or health care provider.  2023 Elsevier/Gold Standard (2021-05-07 00:00:00)

## 2022-03-24 ENCOUNTER — Ambulatory Visit: Payer: BC Managed Care – PPO

## 2022-04-25 ENCOUNTER — Encounter: Payer: Self-pay | Admitting: Oncology

## 2022-04-28 ENCOUNTER — Inpatient Hospital Stay: Payer: BC Managed Care – PPO | Attending: Oncology

## 2022-04-28 VITALS — BP 127/75 | HR 68 | Temp 98.1°F | Resp 18

## 2022-04-28 DIAGNOSIS — E538 Deficiency of other specified B group vitamins: Secondary | ICD-10-CM | POA: Insufficient documentation

## 2022-04-28 DIAGNOSIS — D508 Other iron deficiency anemias: Secondary | ICD-10-CM

## 2022-04-28 MED ORDER — CYANOCOBALAMIN 1000 MCG/ML IJ SOLN
1000.0000 ug | Freq: Once | INTRAMUSCULAR | Status: AC
  Administered 2022-04-28: 1000 ug via INTRAMUSCULAR
  Filled 2022-04-28: qty 1

## 2022-04-28 NOTE — Patient Instructions (Signed)
Vitamin B12 Injection What is this medication? Vitamin B12 (VAHY tuh min B12) prevents and treats low vitamin B12 levels in your body. It is used in people who do not get enough vitamin B12 from their diet or when their digestive tract does not absorb enough. Vitamin B12 plays an important role in maintaining the health of your nervous system and red blood cells. This medicine may be used for other purposes; ask your health care provider or pharmacist if you have questions. COMMON BRAND NAME(S): B-12 Compliance Kit, B-12 Injection Kit, Cyomin, Dodex, LA-12, Nutri-Twelve, Physicians EZ Use B-12, Primabalt What should I tell my care team before I take this medication? They need to know if you have any of these conditions: Kidney disease Leber's disease Megaloblastic anemia An unusual or allergic reaction to cyanocobalamin, cobalt, other medications, foods, dyes, or preservatives Pregnant or trying to get pregnant Breast-feeding How should I use this medication? This medication is injected into a muscle or deeply under the skin. It is usually given in a clinic or care team's office. However, your care team may teach you how to inject yourself. Follow all instructions. Talk to your care team about the use of this medication in children. Special care may be needed. Overdosage: If you think you have taken too much of this medicine contact a poison control center or emergency room at once. NOTE: This medicine is only for you. Do not share this medicine with others. What if I miss a dose? If you are given your dose at a clinic or care team's office, call to reschedule your appointment. If you give your own injections, and you miss a dose, take it as soon as you can. If it is almost time for your next dose, take only that dose. Do not take double or extra doses. What may interact with this medication? Alcohol Colchicine This list may not describe all possible interactions. Give your health care  provider a list of all the medicines, herbs, non-prescription drugs, or dietary supplements you use. Also tell them if you smoke, drink alcohol, or use illegal drugs. Some items may interact with your medicine. What should I watch for while using this medication? Visit your care team regularly. You may need blood work done while you are taking this medication. You may need to follow a special diet. Talk to your care team. Limit your alcohol intake and avoid smoking to get the best benefit. What side effects may I notice from receiving this medication? Side effects that you should report to your care team as soon as possible: Allergic reactions--skin rash, itching, hives, swelling of the face, lips, tongue, or throat Swelling of the ankles, hands, or feet Trouble breathing Side effects that usually do not require medical attention (report to your care team if they continue or are bothersome): Diarrhea This list may not describe all possible side effects. Call your doctor for medical advice about side effects. You may report side effects to FDA at 1-800-FDA-1088. Where should I keep my medication? Keep out of the reach of children. Store at room temperature between 15 and 30 degrees C (59 and 85 degrees F). Protect from light. Throw away any unused medication after the expiration date. NOTE: This sheet is a summary. It may not cover all possible information. If you have questions about this medicine, talk to your doctor, pharmacist, or health care provider.  2023 Elsevier/Gold Standard (2020-11-01 00:00:00)

## 2022-05-26 ENCOUNTER — Inpatient Hospital Stay: Payer: BC Managed Care – PPO | Attending: Oncology

## 2022-05-26 VITALS — BP 116/60 | HR 66 | Temp 97.4°F | Resp 14

## 2022-05-26 DIAGNOSIS — E538 Deficiency of other specified B group vitamins: Secondary | ICD-10-CM | POA: Insufficient documentation

## 2022-05-26 DIAGNOSIS — D508 Other iron deficiency anemias: Secondary | ICD-10-CM

## 2022-05-26 MED ORDER — CYANOCOBALAMIN 1000 MCG/ML IJ SOLN
1000.0000 ug | Freq: Once | INTRAMUSCULAR | Status: AC
  Administered 2022-05-26: 1000 ug via INTRAMUSCULAR
  Filled 2022-05-26: qty 1

## 2022-06-30 ENCOUNTER — Inpatient Hospital Stay: Payer: BC Managed Care – PPO | Attending: Oncology

## 2022-06-30 VITALS — BP 130/71 | HR 67 | Temp 97.9°F | Resp 16

## 2022-06-30 DIAGNOSIS — D508 Other iron deficiency anemias: Secondary | ICD-10-CM

## 2022-06-30 DIAGNOSIS — E538 Deficiency of other specified B group vitamins: Secondary | ICD-10-CM | POA: Insufficient documentation

## 2022-06-30 MED ORDER — CYANOCOBALAMIN 1000 MCG/ML IJ SOLN
1000.0000 ug | Freq: Once | INTRAMUSCULAR | Status: AC
  Administered 2022-06-30: 1000 ug via INTRAMUSCULAR
  Filled 2022-06-30: qty 1

## 2022-06-30 NOTE — Patient Instructions (Signed)
Vitamin B12 Deficiency Vitamin B12 deficiency occurs when the body does not have enough of this important vitamin. The body needs this vitamin: To make red blood cells. To make DNA. This is the genetic material inside cells. To help the nerves work properly so they can carry messages from the brain to the body. Vitamin B12 deficiency can cause health problems, such as not having enough red blood cells in the blood (anemia). This can lead to nerve damage if untreated. What are the causes? This condition may be caused by: Not eating enough foods that contain vitamin B12. Not having enough stomach acid and digestive fluids to properly absorb vitamin B12 from the food that you eat. Having certain diseases that make it hard to absorb vitamin B12. These diseases include Crohn's disease, chronic pancreatitis, and cystic fibrosis. An autoimmune disorder in which the body does not make enough of a protein (intrinsic factor) within the stomach, resulting in not enough absorption of vitamin B12. Having a surgery in which part of the stomach or small intestine is removed. Taking certain medicines that make it hard for the body to absorb vitamin B12. These include: Heartburn medicines, such as antacids and proton pump inhibitors. Some medicines that are used to treat diabetes. What increases the risk? The following factors may make you more likely to develop a vitamin B12 deficiency: Being an older adult. Eating a vegetarian or vegan diet that does not include any foods that come from animals. Eating a poor diet while you are pregnant. Taking certain medicines. Having alcoholism. What are the signs or symptoms? In some cases, there are no symptoms of this condition. If the condition leads to anemia or nerve damage, various symptoms may occur, such as: Weakness. Tiredness (fatigue). Loss of appetite. Numbness or tingling in your hands and feet. Redness and burning of the tongue. Depression,  confusion, or memory problems. Trouble walking. If anemia is severe, symptoms can include: Shortness of breath. Dizziness. Rapid heart rate. How is this diagnosed? This condition may be diagnosed with a blood test to measure the level of vitamin B12 in your blood. You may also have other tests, including: A group of tests that measure certain characteristics of blood cells (complete blood count, CBC). A blood test to measure intrinsic factor. A procedure where a thin tube with a camera on the end is used to look into your stomach or intestines (endoscopy). Other tests may be needed to discover the cause of the deficiency. How is this treated? Treatment for this condition depends on the cause. This condition may be treated by: Changing your eating and drinking habits, such as: Eating more foods that contain vitamin B12. Drinking less alcohol or no alcohol. Getting vitamin B12 injections. Taking vitamin B12 supplements by mouth (orally). Your health care provider will tell you which dose is best for you. Follow these instructions at home: Eating and drinking  Include foods in your diet that come from animals and contain a lot of vitamin B12. These include: Meats and poultry. This includes beef, pork, chicken, turkey, and organ meats, such as liver. Seafood. This includes clams, rainbow trout, salmon, tuna, and haddock. Eggs. Dairy foods such as milk, yogurt, and cheese. Eat foods that have vitamin B12 added to them (are fortified), such as ready-to-eat breakfast cereals. Check the label on the package to see if a food is fortified. The items listed above may not be a complete list of foods and beverages you can eat and drink. Contact a dietitian for   more information. Alcohol use Do not drink alcohol if: Your health care provider tells you not to drink. You are pregnant, may be pregnant, or are planning to become pregnant. If you drink alcohol: Limit how much you have to: 0-1 drink a  day for women. 0-2 drinks a day for men. Know how much alcohol is in your drink. In the [place] one drink equals one 12 oz bottle of beer (355 mL), one 5 oz glass of wine (148 mL), or one 1 oz glass of hard liquor (44 mL). General instructions Get vitamin B12 injections if told to by your health care provider. Take supplements only as told by your health care provider. Follow the directions carefully. Keep all follow-up visits. This is important. Contact a health care provider if: Your symptoms come back. Your symptoms get worse or do not improve with treatment. Get help right away: You develop shortness of breath. You have a rapid heart rate. You have chest pain. You become dizzy or you faint. These symptoms may be an emergency. Get help right away. Call 911. Do not wait to see if the symptoms will go away. Do not drive yourself to the hospital. Summary Vitamin B12 deficiency occurs when the body does not have enough of this important vitamin. Common causes include not eating enough foods that contain vitamin B12, not being able to absorb vitamin B12 from the food that you eat, having a surgery in which part of the stomach or small intestine is removed, or taking certain medicines. Eat foods that have vitamin B12 in them. Treatment may include making a change in the way you eat and drink, getting vitamin B12 injections, or taking vitamin B12 supplements. This information is not intended to replace advice given to you by your health care provider. Make sure you discuss any questions you have with your health care provider. Document Revised: 04/19/2021 Document Reviewed: 04/19/2021 Elsevier Patient Education  2023 Elsevier Inc.  

## 2022-07-28 ENCOUNTER — Inpatient Hospital Stay: Payer: BC Managed Care – PPO | Attending: Oncology

## 2022-07-28 VITALS — BP 135/38 | HR 52 | Temp 98.0°F | Resp 16

## 2022-07-28 DIAGNOSIS — E538 Deficiency of other specified B group vitamins: Secondary | ICD-10-CM | POA: Insufficient documentation

## 2022-07-28 DIAGNOSIS — Z79899 Other long term (current) drug therapy: Secondary | ICD-10-CM | POA: Diagnosis not present

## 2022-07-28 DIAGNOSIS — D508 Other iron deficiency anemias: Secondary | ICD-10-CM

## 2022-07-28 MED ORDER — CYANOCOBALAMIN 1000 MCG/ML IJ SOLN
1000.0000 ug | Freq: Once | INTRAMUSCULAR | Status: AC
  Administered 2022-07-28: 1000 ug via INTRAMUSCULAR
  Filled 2022-07-28: qty 1

## 2022-08-06 ENCOUNTER — Other Ambulatory Visit: Payer: Self-pay | Admitting: Licensed Practical Nurse

## 2022-08-06 DIAGNOSIS — F32A Depression, unspecified: Secondary | ICD-10-CM

## 2022-08-06 DIAGNOSIS — Z3400 Encounter for supervision of normal first pregnancy, unspecified trimester: Secondary | ICD-10-CM

## 2022-08-18 ENCOUNTER — Other Ambulatory Visit: Payer: BC Managed Care – PPO

## 2022-08-19 ENCOUNTER — Inpatient Hospital Stay: Payer: BC Managed Care – PPO | Admitting: Oncology

## 2022-08-20 ENCOUNTER — Ambulatory Visit: Payer: BC Managed Care – PPO

## 2022-08-25 ENCOUNTER — Ambulatory Visit: Payer: BC Managed Care – PPO

## 2022-08-25 ENCOUNTER — Inpatient Hospital Stay: Payer: BC Managed Care – PPO | Attending: Oncology

## 2022-08-25 ENCOUNTER — Inpatient Hospital Stay: Payer: BC Managed Care – PPO

## 2022-08-25 VITALS — BP 116/61 | HR 61 | Temp 97.8°F | Resp 18 | Wt 147.0 lb

## 2022-08-25 DIAGNOSIS — E538 Deficiency of other specified B group vitamins: Secondary | ICD-10-CM | POA: Insufficient documentation

## 2022-08-25 DIAGNOSIS — D508 Other iron deficiency anemias: Secondary | ICD-10-CM | POA: Insufficient documentation

## 2022-08-25 DIAGNOSIS — Z9884 Bariatric surgery status: Secondary | ICD-10-CM | POA: Insufficient documentation

## 2022-08-25 LAB — CBC WITH DIFFERENTIAL (CANCER CENTER ONLY)
Abs Immature Granulocytes: 0.04 10*3/uL (ref 0.00–0.07)
Basophils Absolute: 0.1 10*3/uL (ref 0.0–0.1)
Basophils Relative: 1 %
Eosinophils Absolute: 0.1 10*3/uL (ref 0.0–0.5)
Eosinophils Relative: 1 %
HCT: 41.4 % (ref 36.0–46.0)
Hemoglobin: 13.6 g/dL (ref 12.0–15.0)
Immature Granulocytes: 1 %
Lymphocytes Relative: 25 %
Lymphs Abs: 1.6 10*3/uL (ref 0.7–4.0)
MCH: 27.5 pg (ref 26.0–34.0)
MCHC: 32.9 g/dL (ref 30.0–36.0)
MCV: 83.6 fL (ref 80.0–100.0)
Monocytes Absolute: 0.7 10*3/uL (ref 0.1–1.0)
Monocytes Relative: 11 %
Neutro Abs: 4 10*3/uL (ref 1.7–7.7)
Neutrophils Relative %: 61 %
Platelet Count: 287 10*3/uL (ref 150–400)
RBC: 4.95 MIL/uL (ref 3.87–5.11)
RDW: 12.5 % (ref 11.5–15.5)
WBC Count: 6.4 10*3/uL (ref 4.0–10.5)
nRBC: 0 % (ref 0.0–0.2)

## 2022-08-25 LAB — FOLATE: Folate: 6.3 ng/mL (ref >5.9)

## 2022-08-25 LAB — IRON AND TIBC
Iron: 113 ug/dL (ref 28–170)
Saturation Ratios: 34 % — ABNORMAL HIGH (ref 10.4–31.8)
TIBC: 333 ug/dL (ref 250–450)
UIBC: 220 ug/dL

## 2022-08-25 LAB — VITAMIN B12: Vitamin B-12: 242 pg/mL (ref 180–914)

## 2022-08-25 LAB — FERRITIN: Ferritin: 51 ng/mL (ref 11–307)

## 2022-08-25 MED ORDER — CYANOCOBALAMIN 1000 MCG/ML IJ SOLN
1000.0000 ug | Freq: Once | INTRAMUSCULAR | Status: AC
  Administered 2022-08-25: 1000 ug via INTRAMUSCULAR
  Filled 2022-08-25: qty 1

## 2022-08-25 NOTE — Patient Instructions (Signed)

## 2022-08-25 NOTE — Progress Notes (Signed)
  [facility]  [address] [place],  [place]  [address] [phone]  Clinic Day:  08/26/2022  Referring physician: Philmore Pali, NP  TELEPHONE VISIT  HISTORY OF PRESENT ILLNESS:  The patient is a 31 y.o. female  with iron deficiency anemia.  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  She also had gastric bypass surgery a few years ago.   She comes in today to reassess her labs.  Earlier this year, her iron and hemoglobin levels significantly improved after receiving IV iron.  Since her last visit, the patient has been doing well.  She denies having increased fatigue or any overt forms of blood loss which concern her for recurrent iron deficiency anemia.  PHYSICAL EXAM: deferred   LABS:    Latest Reference Range & Units 08/25/22 09:11  WBC 4.0 - 10.5 K/uL 6.4  RBC 3.87 - 5.11 MIL/uL 4.95  Hemoglobin 12.0 - 15.0 g/dL 13.6  HCT 36.0 - 46.0 % 41.4  MCV 80.0 - 100.0 fL 83.6  MCH 26.0 - 34.0 pg 27.5  MCHC 30.0 - 36.0 g/dL 32.9  RDW 11.5 - 15.5 % 12.5  Platelets 150 - 400 K/uL 287  nRBC 0.0 - 0.2 % 0.0  Neutrophils % 61  Lymphocytes % 25  Monocytes Relative % 11  Eosinophil % 1  Basophil % 1  Immature Granulocytes % 1    Latest Reference Range & Units 08/25/22 09:10  Iron 28 - 170 ug/dL 113  UIBC ug/dL 220  TIBC 250 - 450 ug/dL 333  Saturation Ratios 10.4 - 31.8 % 34 (H)  Ferritin 11 - 307 ng/mL 51  Folate >5.9 ng/mL 6.3  Vitamin B12 180 - 914 pg/mL 242  (H): Data is abnormally high ASSESSMENT & PLAN:  A 31 y.o. female with iron deficiency anemia, likely related to her previous gastric bypass surgery.  When evaluating her recent labs, I am very pleased as her hemoglobin is even better than what it was previously.  Furthermore, all of her iron parameters remain normal.  Clinically, the patient claims to be doing very well.  As that is the case, I will reassess her hematologic  picture in another 6 months.  The patient understands all the plans discussed today and is in agreement with them.   Cynitha Berte Macarthur Critchley, MD

## 2022-08-26 ENCOUNTER — Other Ambulatory Visit: Payer: Self-pay | Admitting: Oncology

## 2022-08-26 ENCOUNTER — Inpatient Hospital Stay (INDEPENDENT_AMBULATORY_CARE_PROVIDER_SITE_OTHER): Payer: BC Managed Care – PPO | Admitting: Oncology

## 2022-08-26 DIAGNOSIS — D508 Other iron deficiency anemias: Secondary | ICD-10-CM

## 2022-08-26 IMAGING — US US MFM OB FOLLOW-UP
1 series · 13 of 28 positions shown · non-contrast
Comparison: none

[Series 1: us mfm ob follow-up · 13 of 68 slices shown]
[im 3/68]
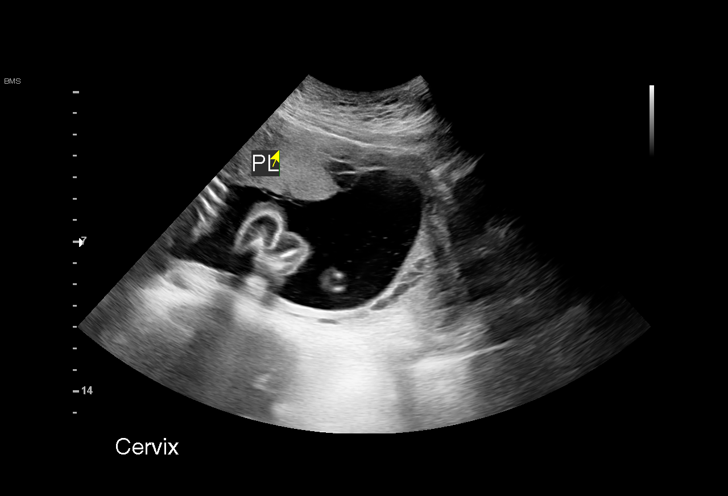
[im 8/68]
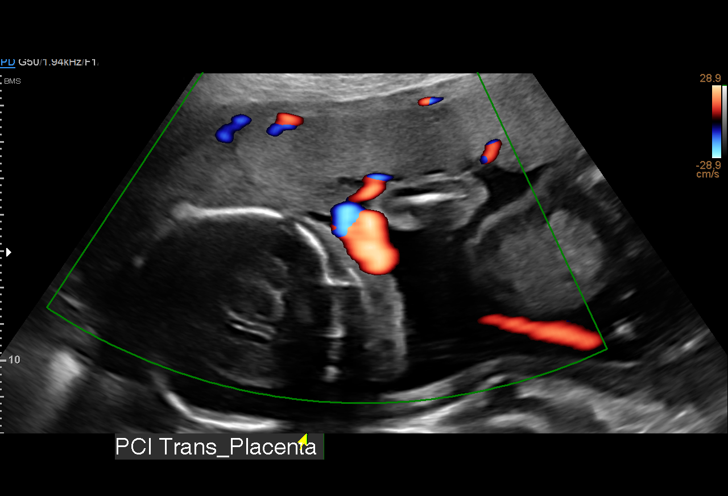
[im 13/68]
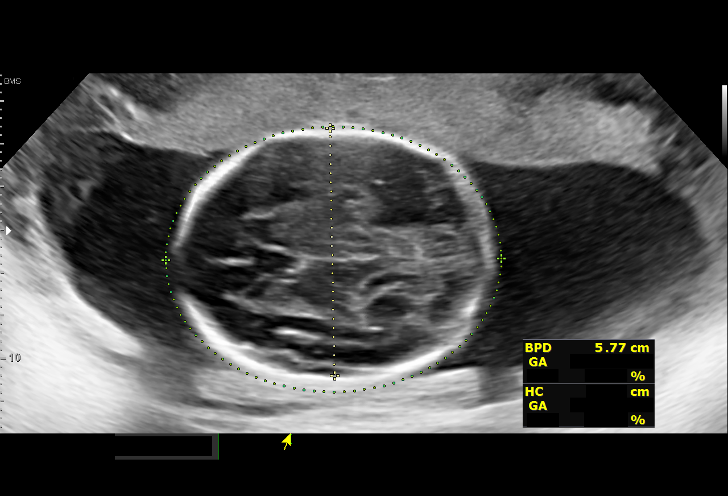
[im 18/68]
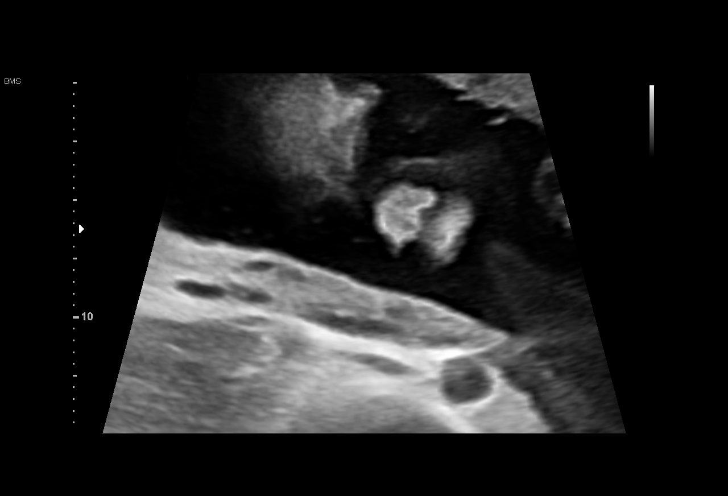
[im 23/68]
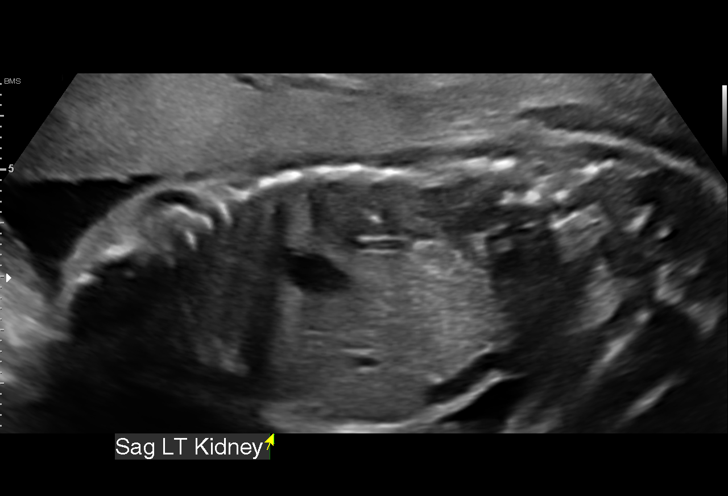
[im 28/68]
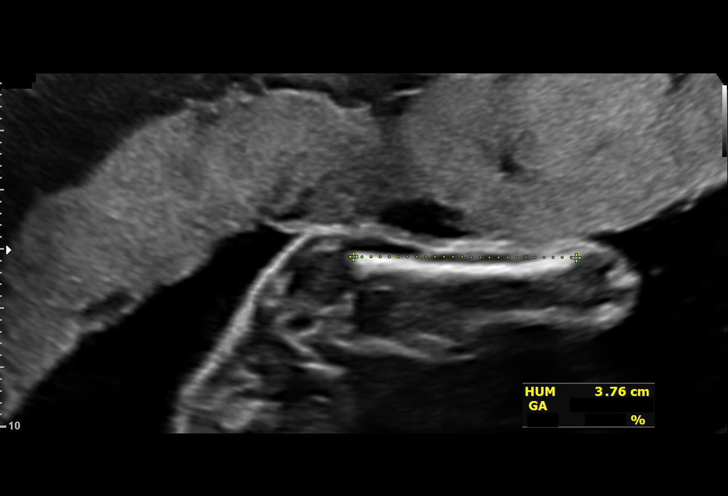
[im 35/68]
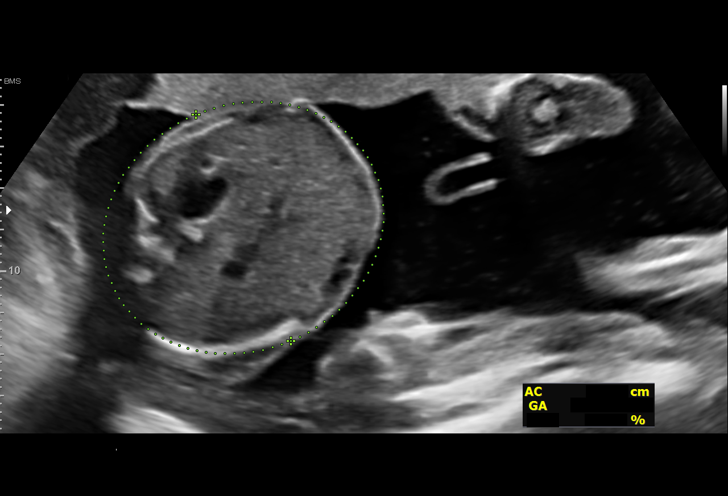
[im 40/68]
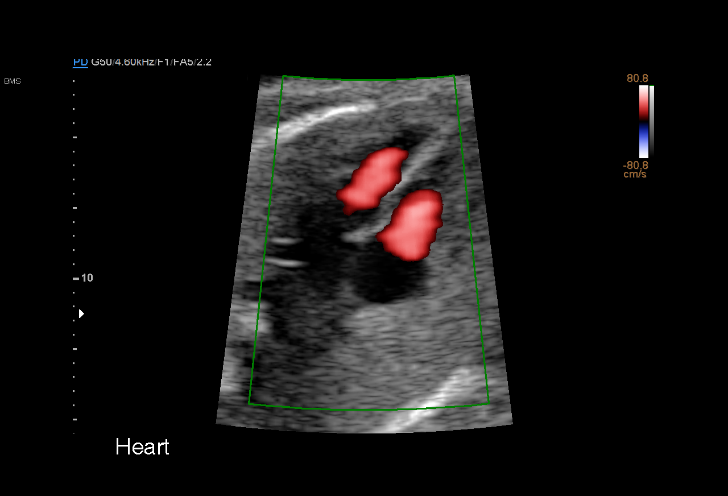
[im 45/68]
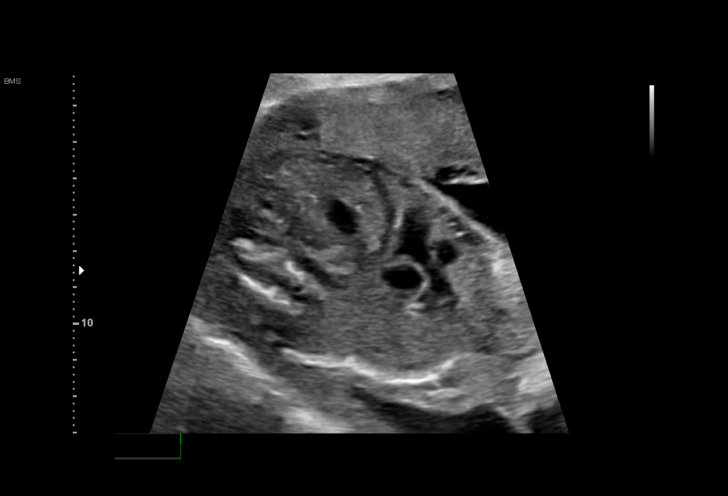
[im 50/68]
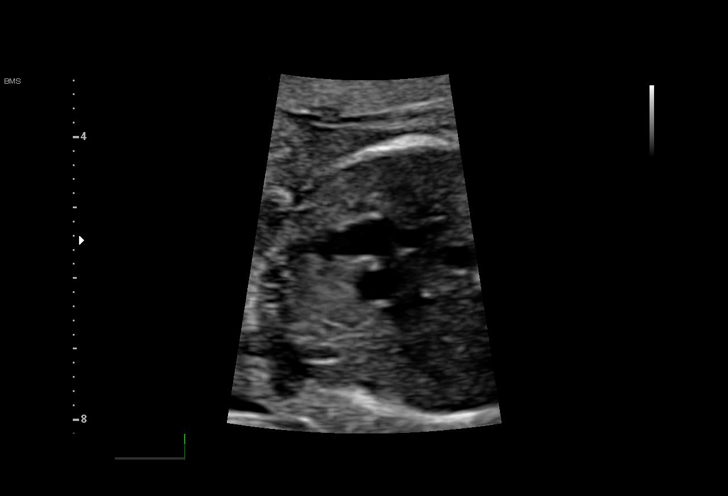
[im 55/68]
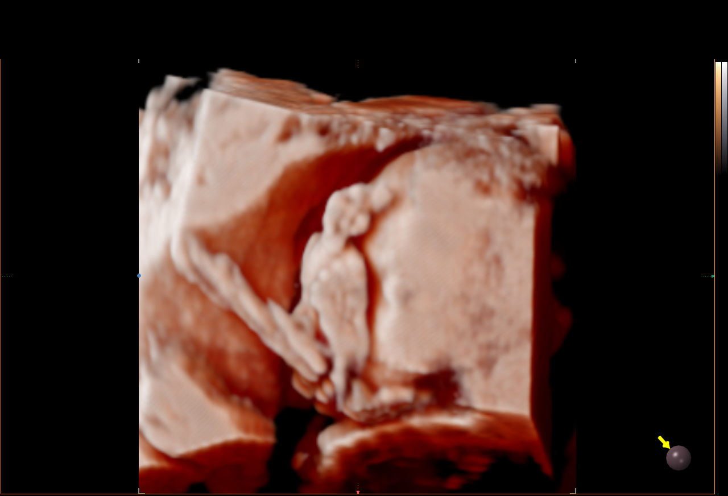
[im 60/68]
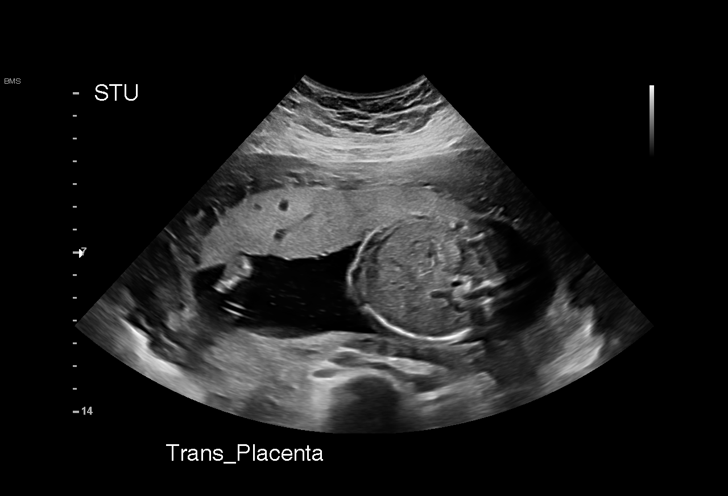
[im 65/68]
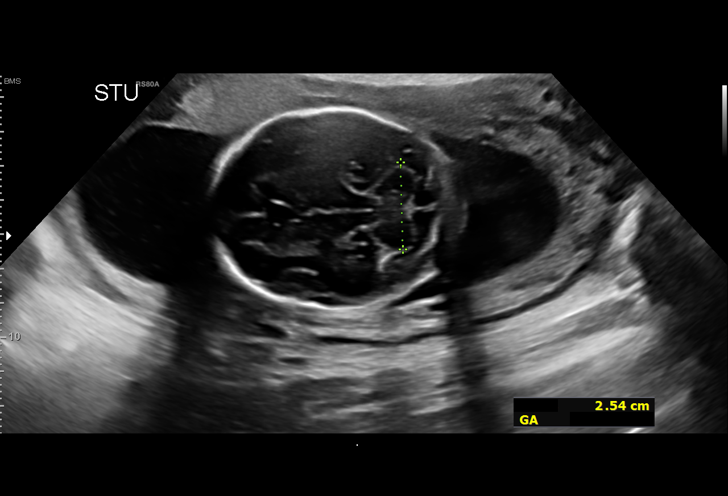

[13 of 28 positions shown; findings below may reference images not displayed]

COL

Indications

 Pregnancy complicated by previous gastric
 bypass, antepartum, second trimester
 Encounter for other antenatal screening
 follow-up
 23 weeks gestation of pregnancy
 LR NIPS, Neg Horizon
 Obesity complicating pregnancy, second
 trimester (BMI 31)
Fetal Evaluation

 Num Of Fetuses:         1
 Fetal Heart Rate(bpm):  155
 Cardiac Activity:       Observed
 Presentation:           Breech
 Placenta:               Anterior
 P. Cord Insertion:      Visualized, central

 Amniotic Fluid
 AFI FV:      Within normal limits

                             Largest Pocket(cm)

Biometry
 BPD:      58.4  mm     G. Age:  23w 6d         68  %    CI:        71.57   %    70 - 86
                                                         FL/HC:      17.4   %    19.2 -
 HC:      219.8  mm     G. Age:  24w 0d         64  %    HC/AC:      1.10        1.05 -
 AC:      199.7  mm     G. Age:  24w 4d         81  %    FL/BPD:     65.4   %    71 - 87
 FL:       38.2  mm     G. Age:  22w 2d         11  %    FL/AC:      19.1   %    20 - 24
 HUM:      37.6  mm     G. Age:  23w 1d         41  %

 Est. FW:     614  gm      1 lb 6 oz     60  %
OB History

 Gravidity:    1
Gestational Age

 LMP:           23w 2d        Date:  02/13/21                 EDD:   11/20/21
 U/S Today:     23w 5d                                        EDD:   11/17/21
 Best:          23w 2d     Det. By:  LMP  (02/13/21)          EDD:   11/20/21
Anatomy

 Cranium:               Appears normal         LVOT:                   Appears normal
 Cavum:                 Appears normal         Aortic Arch:            Previously seen
 Ventricles:            Appears normal         Ductal Arch:            Appears normal
 Choroid Plexus:        Appears normal         Diaphragm:              Appears normal
 Cerebellum:            Appears normal         Stomach:                Appears normal, left
                                                                       sided
 Posterior Fossa:       Appears normal         Abdomen:                Appears normal
 Nuchal Fold:           Appears normal         Abdominal Wall:         Previously seen
 Face:                  Orbits previ, profile  Cord Vessels:           Previously seen
                        not well visualized
 Lips:                  Appears normal         Kidneys:                Appear normal
 Palate:                Not well visualized    Bladder:                Appears normal
 Thoracic:              Appears normal         Spine:                  Previously seen
 Heart:                 Appears normal         Upper Extremities:      Previously seen
                        (4CH, axis, and
                        situs)
 RVOT:                  Appears normal         Lower Extremities:      Previously seen

 Other:  IVC/SVC prev visualized. Right heel and left 5th digit prev visualized.
         3VV visualized. Technically difficult due to fetal position.
Cervix Uterus Adnexa

 Cervix
 Length:           3.12  cm.
 Normal appearance by transabdominal scan.

 Uterus
 No abnormality visualized.

 Right Ovary
 Not visualized.

 Left Ovary
 Within normal limits.
 Cul De Sac
 No free fluid seen.

 Adnexa
 No abnormality visualized.
Impression

 Patient returned for completion of fetal anatomy .Amniotic
 fluid is normal and good fetal activity is seen .Fetal biometry
 is consistent with her previously-established dates .Fetal
 anatomical survey was completed and appears normal.

 Patient has a history of bariatric surgery.  I reassured her of
 normal fetal growth assessment.  Studies have shown
 bariatric surgery is associated with increased likelihood of
 fetal growth restriction.  She is less likely to have gestational
 diabetes or gestational hypertension/preeclampsia.
Recommendations

 -An appointment was made for her to return in 5 weeks for
 fetal growth assessment.
                 Risky, [place]

## 2022-09-02 ENCOUNTER — Encounter: Payer: Self-pay | Admitting: Oncology

## 2022-09-22 ENCOUNTER — Inpatient Hospital Stay: Payer: BC Managed Care – PPO | Attending: Oncology

## 2022-09-22 VITALS — BP 106/52 | HR 62 | Temp 97.8°F | Resp 18

## 2022-09-22 DIAGNOSIS — E538 Deficiency of other specified B group vitamins: Secondary | ICD-10-CM | POA: Diagnosis present

## 2022-09-22 MED ORDER — CYANOCOBALAMIN 1000 MCG/ML IJ SOLN
1000.0000 ug | Freq: Once | INTRAMUSCULAR | Status: AC
  Administered 2022-09-22: 1000 ug via INTRAMUSCULAR
  Filled 2022-09-22: qty 1

## 2022-09-22 NOTE — Patient Instructions (Signed)

## 2022-10-20 ENCOUNTER — Inpatient Hospital Stay: Payer: BC Managed Care – PPO | Attending: Oncology

## 2022-10-20 VITALS — BP 118/48 | HR 62 | Temp 98.0°F | Resp 16 | Ht 61.0 in | Wt 162.2 lb

## 2022-10-20 DIAGNOSIS — E538 Deficiency of other specified B group vitamins: Secondary | ICD-10-CM | POA: Diagnosis present

## 2022-10-20 MED ORDER — CYANOCOBALAMIN 1000 MCG/ML IJ SOLN
1000.0000 ug | Freq: Once | INTRAMUSCULAR | Status: AC
  Administered 2022-10-20: 1000 ug via INTRAMUSCULAR
  Filled 2022-10-20: qty 1

## 2022-10-20 NOTE — Patient Instructions (Signed)

## 2022-11-17 ENCOUNTER — Inpatient Hospital Stay: Payer: BC Managed Care – PPO | Attending: Oncology

## 2022-12-13 ENCOUNTER — Encounter: Payer: Self-pay | Admitting: Oncology

## 2022-12-15 ENCOUNTER — Encounter: Payer: Self-pay | Admitting: Oncology

## 2022-12-15 ENCOUNTER — Inpatient Hospital Stay: Payer: BC Managed Care – PPO | Attending: Oncology

## 2022-12-15 DIAGNOSIS — E538 Deficiency of other specified B group vitamins: Secondary | ICD-10-CM | POA: Diagnosis present

## 2022-12-15 MED ORDER — CYANOCOBALAMIN 1000 MCG/ML IJ SOLN
1000.0000 ug | Freq: Once | INTRAMUSCULAR | Status: AC
  Administered 2022-12-15: 1000 ug via INTRAMUSCULAR
  Filled 2022-12-15: qty 1

## 2023-01-12 ENCOUNTER — Telehealth: Payer: Self-pay

## 2023-01-12 ENCOUNTER — Inpatient Hospital Stay: Payer: BC Managed Care – PPO | Attending: Oncology

## 2023-01-12 NOTE — Telephone Encounter (Signed)
Attempted to call regarding missed appt- No answer

## 2023-01-15 ENCOUNTER — Encounter: Payer: Self-pay | Admitting: Advanced Practice Midwife

## 2023-01-15 ENCOUNTER — Ambulatory Visit: Payer: BC Managed Care – PPO | Admitting: Advanced Practice Midwife

## 2023-01-15 VITALS — BP 121/81 | HR 76 | Ht 61.0 in | Wt 160.0 lb

## 2023-01-15 DIAGNOSIS — Z3169 Encounter for other general counseling and advice on procreation: Secondary | ICD-10-CM

## 2023-01-15 DIAGNOSIS — N926 Irregular menstruation, unspecified: Secondary | ICD-10-CM

## 2023-01-15 MED ORDER — MEDROXYPROGESTERONE ACETATE 10 MG PO TABS
10.0000 mg | ORAL_TABLET | Freq: Every day | ORAL | 2 refills | Status: DC
Start: 2023-01-15 — End: 2023-02-13

## 2023-01-16 ENCOUNTER — Encounter: Payer: Self-pay | Admitting: Advanced Practice Midwife

## 2023-01-16 NOTE — Progress Notes (Signed)
Patient ID: Wendy Johnston, female   DOB: [birth year], 32 y.o.   MRN: [id number]  Reason for Consult: Want to become pregnant (Irregular periods)  Date of Service: 01/15/2023 Subjective:  HPI:  Wendy Johnston is a 32 y.o. female being seen for fertility counseling and reproductive medication assistance. She is accompanied by her husband and 90 month old son. Her son was conceived following dosing of Provera and Letrozole. She had 2 monthly periods beginning in June last year followed by periods every 60-90 days that she suspects are anovulatory. She is trying to conceive and thinks she will need the same regimen. I informed her and her husband that I can prescribe the Provera to stimulate cycles and that she will have to follow up with one of our MDs for the Letrozole. She is agreeable to the plan. LMP 11/14/2022.  Past Medical History:  Diagnosis Date   Anemia    Anxiety    Depression    Uterine adenomyoma    JUVENILE CYSTIC   Family History  Problem Relation Age of Onset   Cervical cancer Mother 7       total hysterectomy   Past Surgical History:  Procedure Laterality Date   GASTRIC ROUX-EN-Y     LAPAROSCOPIC GASTRIC BYPASS  02/24/2017   WISDOM TOOTH EXTRACTION      Short Social History:  Social History   Tobacco Use   Smoking status: Never   Smokeless tobacco: Never  Substance Use Topics   Alcohol use: No    Allergies  Allergen Reactions   Nsaids     Patient has history of gastric bypass.      Current Outpatient Medications  Medication Sig Dispense Refill   Albuterol-Budesonide 90-80 MCG/ACT AERO      medroxyPROGESTERone (PROVERA) 10 MG tablet Take 1 tablet (10 mg total) by mouth daily. Use for ten days 10 tablet 2   montelukast (SINGULAIR) 10 MG tablet      Prenatal MV & Min w/FA-DHA (PRENATAL GUMMIES PO) Take by mouth.     sertraline (ZOLOFT) 25 MG tablet TAKE 1 TABLET BY MOUTH ONCE DAILY FOR 7 DAYS THEN 2 ONCE DAILY 67 tablet 0   penicillin v  potassium (VEETID) 500 MG tablet Take 500 mg by mouth 2 (two) times daily.     No current facility-administered medications for this visit.    Review of Systems  Constitutional:  Negative for chills and fever.  HENT:  Negative for congestion, ear discharge, ear pain, hearing loss, sinus pain and sore throat.   Eyes:  Negative for blurred vision and double vision.  Respiratory:  Negative for cough, shortness of breath and wheezing.   Cardiovascular:  Negative for chest pain, palpitations and leg swelling.  Gastrointestinal:  Negative for abdominal pain, blood in stool, constipation, diarrhea, heartburn, melena, nausea and vomiting.  Genitourinary:  Negative for dysuria, flank pain, frequency, hematuria and urgency.  Musculoskeletal:  Negative for back pain, joint pain and myalgias.  Skin:  Negative for itching and rash.  Neurological:  Negative for dizziness, tingling, tremors, sensory change, speech change, focal weakness, seizures, loss of consciousness, weakness and headaches.  Endo/Heme/Allergies:  Negative for environmental allergies. Does not bruise/bleed easily.  Psychiatric/Behavioral:  Negative for depression, hallucinations, memory loss, substance abuse and suicidal ideas. The patient is not nervous/anxious and does not have insomnia.        Objective:  Objective   Vitals:   01/15/23 1049  BP: 121/81  Pulse: 76  Weight: 160 lb (  72.6 kg)  Height: 5\' 1"  (1.549 m)   Body mass index is 30.23 kg/m. Constitutional: Well nourished, well developed female in no acute distress.  HEENT: normal Skin: Warm and dry.  Cardiovascular: Regular rate and rhythm.   Extremity:  no edema   Respiratory: Clear to auscultation bilateral. Normal respiratory effort Psych: Alert and Oriented x3. No memory deficits. Normal mood and affect.    Assessment/Plan:     32 y.o. G1 P40 female desiring conception, requesting medication assistance  Rx Provera 10 mg take for 10 days day 16 or 21 of  cycle, 2 refills Follow up with MD regarding prescription of Letrozole     Tresea Mall CNM [facility] 01/16/2023, 1:03 PM

## 2023-02-06 ENCOUNTER — Encounter: Payer: Self-pay | Admitting: Oncology

## 2023-02-09 ENCOUNTER — Telehealth: Payer: Self-pay | Admitting: Oncology

## 2023-02-09 ENCOUNTER — Inpatient Hospital Stay: Payer: BC Managed Care – PPO | Attending: Oncology

## 2023-02-09 DIAGNOSIS — D509 Iron deficiency anemia, unspecified: Secondary | ICD-10-CM | POA: Insufficient documentation

## 2023-02-09 DIAGNOSIS — Z9884 Bariatric surgery status: Secondary | ICD-10-CM | POA: Insufficient documentation

## 2023-02-09 NOTE — Telephone Encounter (Signed)
02/09/23  Patient missed her Appt for today - Left Message for patient to call scheduling to reschedule

## 2023-02-12 NOTE — Progress Notes (Signed)
    GYNECOLOGY PROGRESS NOTE  Subjective:    Patient ID: Wendy Johnston, female    DOB: August 18, 1991, 32 y.o.   MRN: [id number]  HPI  Patient is a 32 y.o. G42P1001 female who presents for follow up on fertility. She was recently seen by Obie Dredge for fertility counseling and reproductive medication assistance. Her son was conceived  ~ 2 years ago following dosing of Provera and Letrozole. She had 2 monthly periods beginning in June last year followed by periods every 60-120 days that she suspects are anovulatory. She and her husband are now trying to conceive again, and thinks she will need the same regimen. She was prescribed the Provera  by midwife to stimulate cycles and  was told that she would have to follow up with an MD for the Letrozole.  Patient's last menstrual period was 01/21/2023 (approximate).   The following portions of the patient's history were reviewed and updated as appropriate:   She  has a past medical history of Anemia, Anxiety, Depression, and Uterine adenomyoma.  She  has a past surgical history that includes Laparoscopic gastric bypass (02/24/2017); Wisdom tooth extraction; and Gastric Roux-En-Y. Her family history includes Cervical cancer (age of onset: 20) in her mother.  She  reports that she has never smoked. She has never used smokeless tobacco. She reports that she does not drink alcohol and does not use drugs.  Current Outpatient Medications on File Prior to Visit  Medication Sig Dispense Refill   Albuterol-Budesonide 90-80 MCG/ACT AERO      montelukast (SINGULAIR) 10 MG tablet      Prenatal MV & Min w/FA-DHA (PRENATAL GUMMIES PO) Take by mouth.     sertraline (ZOLOFT) 25 MG tablet TAKE 1 TABLET BY MOUTH ONCE DAILY FOR 7 DAYS THEN 2 ONCE DAILY 67 tablet 0   No current facility-administered medications on file prior to visit.   She is allergic to nsaids..  Review of Systems Pertinent items noted in HPI and remainder of comprehensive ROS  otherwise negative.   Objective:   Blood pressure 114/68, pulse 75, resp. rate 16, height 5\' 1"  (1.549 m), weight 163 lb 14.4 oz (74.3 kg), last menstrual period 01/21/2023, not currently breastfeeding. Body mass index is 30.97 kg/m. General appearance: alert and no distress Remainder of exam deferred.    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  She was previously given Rx for Provera 10mg  po qd x 10 days but has not yet initiated.   She was told to watch for withdrawal bleeding after the course, the first day of bleeding will be Day 1 for her next cycle.  She was also given a Rx for Femara 2.5 mg po qd to take on days 5-9 of her cycle.  Patient also advised to continue frequent intercourse especially around Day 14 of her upcoming cycle (qod intercourse around days 9 - 18).  By Day 35, if no pregnancy noted, can begin next round of Provera and Femara.  Will f/u  on Day 21 of her cycle for progesterone level to assess for ovulation.  - To f/u in 3 months if no pregnancy occurs.    Hildred Laser, MD [facility]

## 2023-02-13 ENCOUNTER — Encounter: Payer: Self-pay | Admitting: Obstetrics and Gynecology

## 2023-02-13 ENCOUNTER — Ambulatory Visit: Payer: BC Managed Care – PPO | Admitting: Obstetrics and Gynecology

## 2023-02-13 VITALS — BP 114/68 | HR 75 | Resp 16 | Ht 61.0 in | Wt 163.9 lb

## 2023-02-13 DIAGNOSIS — N970 Female infertility associated with anovulation: Secondary | ICD-10-CM | POA: Diagnosis not present

## 2023-02-13 MED ORDER — LETROZOLE 2.5 MG PO TABS
2.5000 mg | ORAL_TABLET | Freq: Every day | ORAL | 1 refills | Status: DC
Start: 2023-02-13 — End: 2023-06-29

## 2023-02-13 NOTE — Patient Instructions (Addendum)
FEMARA PATIENT INSTRUCTIONS  WHY USE IT? Femara helps your ovaries to release eggs (ovulate).  HOW TO USE IT? Femara is taken as a pill usually on days 5,6,7,8, & 9 of your cycle.  Day 1 is the first day of your period. The dose or duration may be changed to achieve ovulation.  Provera (progesterone) may first be used to bring on a period for some patients.   If you do not get pregnant this cycle, for your next cycles, take on days 1, 2, 3, 4 and 5.  If you do not get a period, take Provera 10 mg daily for 10 days to bring on a period; the first day you get bleeding is Day 1 of your cycle.  The day of ovulation on Femara is usually between cycle day 14 and 17.  Having sexual intercourse at least every other day between cycle day 13 and 18 will improve your chances of becoming pregnant during the Femara cycle.  You may monitor your ovulation using basal body temperature charts or with ovulation kits.  If using the ovulation predictor kits, having intercourse the day of the surge and the two days following is recommended. If you get your period, call when it starts for an appointment with your doctor, so that an exam may be done, and another Femara cycle can be considered if appropriate. If you do not get a period by day 35 of the cycle, please get a blood pregnancy test.  If it is negative, speak to your doctor for instructions to bring on another period and to plan a follow-up appointment.  THINGS TO KNOW: If you get pregnant while using Femara , your chance of twins is 7% and triplets is less than 1%. Some studies have suggested the use of "fertility drugs" may increase your risk of ovarian cancers in the future.  It is unclear if these drugs increase the risk, or people who have problems with fertility are prone for these cancers.  If there is an actual risk, it is very low.  If you have a history of liver problems or ovarian cancer, it may be wise to avoid this medication.  SIDE EFFECTS:  The  most common side effect is hot flashes (20%).  Breast tenderness, headaches, nausea, bloating may also occur at different times.  Less than 3/1,000 people have dryness or loss of hair.  Persistent ovarian cysts may form from the use of this medication.  Ovarian hyperstimulation syndrome is a rare side effect at low doses.  Visual changes like flashes of light or blurring.       

## 2023-02-24 ENCOUNTER — Inpatient Hospital Stay: Payer: BC Managed Care – PPO

## 2023-02-24 DIAGNOSIS — Z9884 Bariatric surgery status: Secondary | ICD-10-CM | POA: Diagnosis not present

## 2023-02-24 DIAGNOSIS — D509 Iron deficiency anemia, unspecified: Secondary | ICD-10-CM | POA: Diagnosis present

## 2023-02-24 LAB — CBC WITH DIFFERENTIAL (CANCER CENTER ONLY)
Abs Immature Granulocytes: 0.05 10*3/uL (ref 0.00–0.07)
Basophils Absolute: 0.1 10*3/uL (ref 0.0–0.1)
Basophils Relative: 1 %
Eosinophils Absolute: 0.1 10*3/uL (ref 0.0–0.5)
Eosinophils Relative: 1 %
HCT: 38.7 % (ref 36.0–46.0)
Hemoglobin: 12.8 g/dL (ref 12.0–15.0)
Immature Granulocytes: 1 %
Lymphocytes Relative: 21 %
Lymphs Abs: 1.7 10*3/uL (ref 0.7–4.0)
MCH: 28 pg (ref 26.0–34.0)
MCHC: 33.1 g/dL (ref 30.0–36.0)
MCV: 84.7 fL (ref 80.0–100.0)
Monocytes Absolute: 0.5 10*3/uL (ref 0.1–1.0)
Monocytes Relative: 6 %
Neutro Abs: 5.6 10*3/uL (ref 1.7–7.7)
Neutrophils Relative %: 70 %
Platelet Count: 312 10*3/uL (ref 150–400)
RBC: 4.57 MIL/uL (ref 3.87–5.11)
RDW: 12.5 % (ref 11.5–15.5)
WBC Count: 8 10*3/uL (ref 4.0–10.5)
nRBC: 0 % (ref 0.0–0.2)

## 2023-02-24 LAB — FOLATE: Folate: 14.7 ng/mL (ref >5.9)

## 2023-02-24 LAB — IRON AND TIBC
Iron: 38 ug/dL (ref 28–170)
Saturation Ratios: 11 % (ref 10.4–31.8)
TIBC: 340 ug/dL (ref 250–450)
UIBC: 302 ug/dL

## 2023-02-24 LAB — VITAMIN B12: Vitamin B-12: 428 pg/mL (ref 180–914)

## 2023-02-24 LAB — FERRITIN: Ferritin: 26 ng/mL (ref 11–307)

## 2023-02-25 NOTE — Progress Notes (Signed)
  [facility]  [address] [place],  [place]  [address] [phone]  Clinic Day:  02/26/2023  Referring physician: Ronal Fear, NP  TELEPHONE VISIT  HISTORY OF PRESENT ILLNESS:  The patient is a 32 y.o. female  with iron deficiency anemia.  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  She also had gastric bypass surgery a few years ago.   Her telephone visit today is to reassess her labs.  Since her last visit, the patient has been doing well.  She denies having increased fatigue or any overt forms of blood loss which concern her for recurrent iron deficiency anemia.  PHYSICAL EXAM: deferred   LABS:    Latest Reference Range & Units 02/24/23 15:44  WBC 4.0 - 10.5 K/uL 8.0  RBC 3.87 - 5.11 MIL/uL 4.57  Hemoglobin 12.0 - 15.0 g/dL 91.4  HCT 78.2 - 95.6 % 38.7  MCV 80.0 - 100.0 fL 84.7  MCH 26.0 - 34.0 pg 28.0  MCHC 30.0 - 36.0 g/dL 21.3  RDW 08.6 - 57.8 % 12.5  Platelets 150 - 400 K/uL 312  nRBC 0.0 - 0.2 % 0.0  Neutrophils % 70  Lymphocytes % 21  Monocytes Relative % 6  Eosinophil % 1  Basophil % 1  Immature Granulocytes % 1    Latest Reference Range & Units 02/24/23 15:44  Iron 28 - 170 ug/dL 38  UIBC ug/dL 469  TIBC 629 - 528 ug/dL 413  Saturation Ratios 10.4 - 31.8 % 11  Ferritin 11 - 307 ng/mL 26  Folate >5.9 ng/mL 14.7  Vitamin B12 180 - 914 pg/mL 428   ASSESSMENT & PLAN:  A 32 y.o. female with iron deficiency anemia, likely related to her previous gastric bypass surgery.  When evaluating her recent labs, I am very pleased as her hemoglobin remains ideal.   Furthermore, all of her iron parameters remain normal.  Clinically, the patient claims to be doing very well.  As that is the case, I will reassess her hematologic picture in another 6 months.  The patient understands all the plans discussed today and is in agreement with them.   Hopelynn Gartland Kirby Funk, MD

## 2023-02-26 ENCOUNTER — Inpatient Hospital Stay: Payer: BC Managed Care – PPO | Admitting: Oncology

## 2023-02-26 ENCOUNTER — Other Ambulatory Visit: Payer: Self-pay | Admitting: Oncology

## 2023-02-26 DIAGNOSIS — D508 Other iron deficiency anemias: Secondary | ICD-10-CM | POA: Diagnosis not present

## 2023-02-27 ENCOUNTER — Telehealth: Payer: Self-pay | Admitting: Oncology

## 2023-02-27 NOTE — Telephone Encounter (Signed)
02/27/23 Spoke with patient and scheduled f/u appt with bloodwork.

## 2023-05-15 NOTE — Progress Notes (Unsigned)
    NURSE VISIT NOTE  Subjective:    Patient ID: Aliea Behrns, female    DOB: [birth year], 32 y.o.   MRN: [id number]  HPI  Patient is a 32 y.o. G36P1001 female who presents for evaluation of amenorrhea. She believes she could be pregnant. {pregnancy desire:14500::"Pregnancy is desired."} Sexual Activity: {sexual partners:705}. Current symptoms also include: {preg sx:18128}. Last period was {norm/abn:16337}.    Objective:    There were no vitals taken for this visit.  Lab Review  No results found for any visits on 05/18/23.  Assessment:   No diagnosis found.  Plan:   {AOB + PREGNANCY PLAN:28596:p}  {AOB - PREGNANCY PLAN:28597:p}   Fonda Kinder, CMA

## 2023-05-18 ENCOUNTER — Ambulatory Visit (INDEPENDENT_AMBULATORY_CARE_PROVIDER_SITE_OTHER): Payer: BC Managed Care – PPO

## 2023-05-18 VITALS — BP 120/81 | HR 74 | Wt 162.4 lb

## 2023-05-18 DIAGNOSIS — Z3201 Encounter for pregnancy test, result positive: Secondary | ICD-10-CM | POA: Diagnosis not present

## 2023-05-18 DIAGNOSIS — N912 Amenorrhea, unspecified: Secondary | ICD-10-CM

## 2023-05-18 LAB — POCT URINE PREGNANCY: Preg Test, Ur: POSITIVE — AB

## 2023-05-19 ENCOUNTER — Ambulatory Visit: Payer: BC Managed Care – PPO | Admitting: Obstetrics and Gynecology

## 2023-06-01 ENCOUNTER — Ambulatory Visit (INDEPENDENT_AMBULATORY_CARE_PROVIDER_SITE_OTHER): Payer: BC Managed Care – PPO

## 2023-06-01 VITALS — Wt 163.0 lb

## 2023-06-01 DIAGNOSIS — Z3480 Encounter for supervision of other normal pregnancy, unspecified trimester: Secondary | ICD-10-CM | POA: Insufficient documentation

## 2023-06-01 DIAGNOSIS — Z3403 Encounter for supervision of normal first pregnancy, third trimester: Secondary | ICD-10-CM | POA: Insufficient documentation

## 2023-06-01 DIAGNOSIS — Z3689 Encounter for other specified antenatal screening: Secondary | ICD-10-CM

## 2023-06-01 DIAGNOSIS — Z369 Encounter for antenatal screening, unspecified: Secondary | ICD-10-CM

## 2023-06-01 NOTE — Patient Instructions (Signed)
First Trimester of Pregnancy  The first trimester of pregnancy starts on the first day of your last menstrual period until the end of week 12. This is also called months 1 through 3 of pregnancy. Body changes during your first trimester Your body goes through many changes during pregnancy. The changes usually return to normal after your baby is born. Physical changes You may gain or lose weight. Your breasts may grow larger and hurt. The area around your nipples may get darker. Dark spots or blotches may develop on your face. You may have changes in your hair. Health changes You may feel like you might vomit (nauseous), and you may vomit. You may have heartburn. You may have headaches. You may have trouble pooping (constipation). Your gums may bleed. Other changes You may get tired easily. You may pee (urinate) more often. Your menstrual periods will stop. You may not feel hungry. You may want to eat certain kinds of food. You may have changes in your emotions from day to day. You may have more dreams. Follow these instructions at home: Medicines Take over-the-counter and prescription medicines only as told by your doctor. Some medicines are not safe during pregnancy. Take a prenatal vitamin that contains at least 600 micrograms (mcg) of folic acid. Eating and drinking Eat healthy meals that include: Fresh fruits and vegetables. Whole grains. Good sources of protein, such as meat, eggs, or tofu. Low-fat dairy products. Avoid raw meat and unpasteurized juice, milk, and cheese. If you feel like you may vomit, or you vomit: Eat 4 or 5 small meals a day instead of 3 large meals. Try eating a few soda crackers. Drink liquids between meals instead of during meals. You may need to take these actions to prevent or treat trouble pooping: Drink enough fluids to keep your pee (urine) pale yellow. Eat foods that are high in fiber. These include beans, whole grains, and fresh fruits and  vegetables. Limit foods that are high in fat and sugar. These include fried or sweet foods. Activity Exercise only as told by your doctor. Most people can do their usual exercise routine during pregnancy. Stop exercising if you have cramps or pain in your lower belly (abdomen) or low back. Do not exercise if it is too hot or too humid, or if you are in a place of great height (high altitude). Avoid heavy lifting. If you choose to, you may have sex unless your doctor tells you not to. Relieving pain and discomfort Wear a good support bra if your breasts are sore. Rest with your legs raised (elevated) if you have leg cramps or low back pain. If you have bulging veins (varicose veins) in your legs: Wear support hose as told by your doctor. Raise your feet for 15 minutes, 3-4 times a day. Limit salt in your food. Safety Wear your seat belt at all times when you are in a car. Talk with your doctor if someone is hurting you or yelling at you. Talk with your doctor if you are feeling sad or have thoughts of hurting yourself. Lifestyle Do not use hot tubs, steam rooms, or saunas. Do not douche. Do not use tampons or scented sanitary pads. Do not use herbal medicines, illegal drugs, or medicines that are not approved by your doctor. Do not drink alcohol. Do not smoke or use any products that contain nicotine or tobacco. If you need help quitting, ask your doctor. Avoid cat litter boxes and soil that is used by cats. These carry   germs that can cause harm to the baby and can cause a loss of your baby by miscarriage or stillbirth. General instructions Keep all follow-up visits. This is important. Ask for help if you need counseling or if you need help with nutrition. Your doctor can give you advice or tell you where to go for help. Visit your dentist. At home, brush your teeth with a soft toothbrush. Floss gently. Write down your questions. Take them to your prenatal visits. Where to find more  information American Pregnancy Association: americanpregnancy.org American College of Obstetricians and Gynecologists: www.acog.org Office on Women's Health: womenshealth.gov/pregnancy Contact a doctor if: You are dizzy. You have a fever. You have mild cramps or pressure in your lower belly. You have a nagging pain in your belly area. You continue to feel like you may vomit, you vomit, or you have watery poop (diarrhea) for 24 hours or longer. You have a bad-smelling fluid coming from your vagina. You have pain when you pee. You are exposed to a disease that spreads from person to person, such as chickenpox, measles, Zika virus, HIV, or hepatitis. Get help right away if: You have spotting or bleeding from your vagina. You have very bad belly cramping or pain. You have shortness of breath or chest pain. You have any kind of injury, such as from a fall or a car crash. You have new or increased pain, swelling, or redness in an arm or leg. Summary The first trimester of pregnancy starts on the first day of your last menstrual period until the end of week 12 (months 1 through 3). Eat 4 or 5 small meals a day instead of 3 large meals. Do not smoke or use any products that contain nicotine or tobacco. If you need help quitting, ask your doctor. Keep all follow-up visits. This information is not intended to replace advice given to you by your health care provider. Make sure you discuss any questions you have with your health care provider. Document Revised: 02/01/2020 Document Reviewed: 12/08/2019 Elsevier Patient Education  2024 [employer]. Commonly Asked Questions During Pregnancy  Cats: A parasite can be excreted in cat feces.  To avoid exposure you need to have another person empty the little box.  If you must empty the litter box you will need to wear gloves.  Wash your hands after handling your cat.  This parasite can also be found in raw or undercooked meat so this should also be  avoided.  Colds, Sore Throats, Flu: Please check your medication sheet to see what you can take for symptoms.  If your symptoms are unrelieved by these medications please call the office.  Dental Work: Most any dental work your dentist recommends is permitted.  X-rays should only be taken during the first trimester if absolutely necessary.  Your abdomen should be shielded with a lead apron during all x-rays.  Please notify your provider prior to receiving any x-rays.  Novocaine is fine; gas is not recommended.  If your dentist requires a note from us prior to dental work please call the office and we will provide one for you.  Exercise: Exercise is an important part of staying healthy during your pregnancy.  You may continue most exercises you were accustomed to prior to pregnancy.  Later in your pregnancy you will most likely notice you have difficulty with activities requiring balance like riding a bicycle.  It is important that you listen to your body and avoid activities that put you at a higher   risk of falling.  Adequate rest and staying well hydrated are a must!  If you have questions about the safety of specific activities ask your provider.    Exposure to Children with illness: Try to avoid obvious exposure; report any symptoms to us when noted,  If you have chicken pos, red measles or mumps, you should be immune to these diseases.   Please do not take any vaccines while pregnant unless you have checked with your OB provider.  Fetal Movement: After 28 weeks we recommend you do "kick counts" twice daily.  Lie or sit down in a calm quiet environment and count your baby movements "kicks".  You should feel your baby at least 10 times per hour.  If you have not felt 10 kicks within the first hour get up, walk around and have something sweet to eat or drink then repeat for an additional hour.  If count remains less than 10 per hour notify your provider.  Fumigating: Follow your pest control agent's  advice as to how long to stay out of your home.  Ventilate the area well before re-entering.  Hemorrhoids:   Most over-the-counter preparations can be used during pregnancy.  Check your medication to see what is safe to use.  It is important to use a stool softener or fiber in your diet and to drink lots of liquids.  If hemorrhoids seem to be getting worse please call the office.   Hot Tubs:  Hot tubs Jacuzzis and saunas are not recommended while pregnant.  These increase your internal body temperature and should be avoided.  Intercourse:  Sexual intercourse is safe during pregnancy as long as you are comfortable, unless otherwise advised by your provider.  Spotting may occur after intercourse; report any bright red bleeding that is heavier than spotting.  Labor:  If you know that you are in labor, please go to the hospital.  If you are unsure, please call the office and let us help you decide what to do.  Lifting, straining, etc:  If your job requires heavy lifting or straining please check with your provider for any limitations.  Generally, you should not lift items heavier than that you can lift simply with your hands and arms (no back muscles)  Painting:  Paint fumes do not harm your pregnancy, but may make you ill and should be avoided if possible.  Latex or water based paints have less odor than oils.  Use adequate ventilation while painting.  Permanents & Hair Color:  Chemicals in hair dyes are not recommended as they cause increase hair dryness which can increase hair loss during pregnancy.  " Highlighting" and permanents are allowed.  Dye may be absorbed differently and permanents may not hold as well during pregnancy.  Sunbathing:  Use a sunscreen, as skin burns easily during pregnancy.  Drink plenty of fluids; avoid over heating.  Tanning Beds:  Because their possible side effects are still unknown, tanning beds are not recommended.  Ultrasound Scans:  Routine ultrasounds are performed  at approximately 20 weeks.  You will be able to see your baby's general anatomy an if you would like to know the gender this can usually be determined as well.  If it is questionable when you conceived you may also receive an ultrasound early in your pregnancy for dating purposes.  Otherwise ultrasound exams are not routinely performed unless there is a medical necessity.  Although you can request a scan we ask that you pay for it when   conducted because insurance does not cover " patient request" scans.  Work: If your pregnancy proceeds without complications you may work until your due date, unless your physician or employer advises otherwise.  Round Ligament Pain/Pelvic Discomfort:  Sharp, shooting pains not associated with bleeding are fairly common, usually occurring in the second trimester of pregnancy.  They tend to be worse when standing up or when you remain standing for long periods of time.  These are the result of pressure of certain pelvic ligaments called "round ligaments".  Rest, Tylenol and heat seem to be the most effective relief.  As the womb and fetus grow, they rise out of the pelvis and the discomfort improves.  Please notify the office if your pain seems different than that described.  It may represent a more serious condition.  Common Medications Safe in Pregnancy  Acne:      Constipation:  Benzoyl Peroxide     Colace  Clindamycin      Dulcolax Suppository  Topica Erythromycin     Fibercon  Salicylic Acid      Metamucil         Miralax AVOID:        Senakot   Accutane    Cough:  Retin-A       Cough Drops  Tetracycline      Phenergan w/ Codeine if Rx  Minocycline      Robitussin (Plain & DM)  Antibiotics:     Crabs/Lice:  Ceclor       RID  Cephalosporins    AVOID:  E-Mycins      Kwell  Keflex  Macrobid/Macrodantin   Diarrhea:  Penicillin      Kao-Pectate  Zithromax      Imodium AD         PUSH FLUIDS AVOID:       Cipro     Fever:  Tetracycline      Tylenol (Regular  or Extra  Minocycline       Strength)  Levaquin      Extra Strength-Do not          Exceed 8 tabs/24 hrs Caffeine:        <200mg/day (equiv. To 1 cup of coffee or  approx. 3 12 oz sodas)         Gas: Cold/Hayfever:       Gas-X  Benadryl      Mylicon  Claritin       Phazyme  **Claritin-D        Chlor-Trimeton    Headaches:  Dimetapp      ASA-Free Excedrin  Drixoral-Non-Drowsy     Cold Compress  Mucinex (Guaifenasin)     Tylenol (Regular or Extra  Sudafed/Sudafed-12 Hour     Strength)  **Sudafed PE Pseudoephedrine   Tylenol Cold & Sinus     Vicks Vapor Rub  Zyrtec  **AVOID if Problems With Blood Pressure         Heartburn: Avoid lying down for at least 1 hour after meals  Aciphex      Maalox     Rash:  Milk of Magnesia     Benadryl    Mylanta       1% Hydrocortisone Cream  Pepcid  Pepcid Complete   Sleep Aids:  Prevacid      Ambien   Prilosec       Benadryl  Rolaids       Chamomile Tea  Tums (Limit 4/day)     Unisom           Tylenol PM         Warm milk-add vanilla or  Hemorrhoids:       Sugar for taste  Anusol/Anusol H.C.  (RX: Analapram 2.5%)  Sugar Substitutes:  Hydrocortisone OTC     Ok in moderation  Preparation H      Tucks        Vaseline lotion applied to tissue with wiping    Herpes:     Throat:  Acyclovir      Oragel  Famvir  Valtrex     Vaccines:         Flu Shot Leg Cramps:       *Gardasil  Benadryl      Hepatitis A         Hepatitis B Nasal Spray:       Pneumovax  Saline Nasal Spray     Polio Booster         Tetanus Nausea:       Tuberculosis test or PPD  Vitamin B6 25 mg TID   AVOID:    Dramamine      *Gardasil  Emetrol       Live Poliovirus  Ginger Root 250 mg QID    MMR (measles, mumps &  High Complex Carbs @ Bedtime    rebella)  Sea Bands-Accupressure    Varicella (Chickenpox)  Unisom 1/2 tab TID     *No known complications           If received before Pain:         Known pregnancy;   Darvocet       Resume series  after  Lortab        Delivery  Percocet    Yeast:   Tramadol      Femstat  Tylenol 3      Gyne-lotrimin  Ultram       Monistat  Vicodin           MISC:         All Sunscreens           Hair Coloring/highlights          Insect Repellant's          (Including DEET)         Mystic Tans  

## 2023-06-01 NOTE — Progress Notes (Signed)
New OB Intake  I connected with  Wendy Johnston on 06/01/23 at  3:15 PM EDT by telephone Video Visit and verified that I am speaking with the correct person using two identifiers. Nurse is located at [facility] and pt is located at work.  I explained I am completing New OB Intake today. We discussed her EDD of 01/08/2024 that is based on LMP of 04/03/2023. Pt is G2/P1001. I reviewed her allergies, medications, Medical/Surgical/OB history, and appropriate screenings. There are cats in the home.  FOB changes the litter box. Based on history, this is a/an pregnancy uncomplicated .   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  Anatomy US Explained first scheduled [place] will be Sept 30th and an anatomy scan will be done at 20 weeks.  Labs Discussed genetic screening with patient. Patient desires genetic testing to be drawn at new OB visit. Discussed possible labs to be drawn at new OB appointment.  COVID Vaccine Patient has had COVID vaccine.   Social Determinants of Health Food Insecurity: denies food insecurity Transportation: Patient denies transportation needs. Childcare: Discussed no children allowed at ultrasound appointments.   First visit review I reviewed new OB appt with pt. I explained she will have  ob bloodwork and pap smear/pelvic exam if indicated. Explained pt will be seen by Hartley Barefoot, CNM at first visit; encounter routed to appropriate provider.   Loran Senters, [facility] 06/01/2023  3:41 PM

## 2023-06-08 ENCOUNTER — Ambulatory Visit (INDEPENDENT_AMBULATORY_CARE_PROVIDER_SITE_OTHER): Payer: BC Managed Care – PPO

## 2023-06-08 ENCOUNTER — Other Ambulatory Visit: Payer: Self-pay | Admitting: Certified Nurse Midwife

## 2023-06-08 DIAGNOSIS — Z369 Encounter for antenatal screening, unspecified: Secondary | ICD-10-CM

## 2023-06-08 DIAGNOSIS — Z3480 Encounter for supervision of other normal pregnancy, unspecified trimester: Secondary | ICD-10-CM

## 2023-06-08 DIAGNOSIS — Z3687 Encounter for antenatal screening for uncertain dates: Secondary | ICD-10-CM

## 2023-06-08 DIAGNOSIS — Z3A09 9 weeks gestation of pregnancy: Secondary | ICD-10-CM | POA: Diagnosis not present

## 2023-06-25 ENCOUNTER — Encounter: Payer: BC Managed Care – PPO | Admitting: Certified Nurse Midwife

## 2023-06-29 ENCOUNTER — Other Ambulatory Visit (HOSPITAL_COMMUNITY)
Admission: RE | Admit: 2023-06-29 | Discharge: 2023-06-29 | Disposition: A | Discharge status: Home / Self Care | Payer: BC Managed Care – PPO | Source: Ambulatory Visit | Attending: Certified Nurse Midwife | Admitting: Certified Nurse Midwife

## 2023-06-29 ENCOUNTER — Ambulatory Visit (INDEPENDENT_AMBULATORY_CARE_PROVIDER_SITE_OTHER): Payer: BC Managed Care – PPO

## 2023-06-29 VITALS — BP 124/72 | HR 68 | Wt 166.8 lb

## 2023-06-29 DIAGNOSIS — Z130 Encounter for screening for diseases of the blood and blood-forming organs and certain disorders involving the immune mechanism: Secondary | ICD-10-CM

## 2023-06-29 DIAGNOSIS — O99213 Obesity complicating pregnancy, third trimester: Secondary | ICD-10-CM

## 2023-06-29 DIAGNOSIS — Z3481 Encounter for supervision of other normal pregnancy, first trimester: Secondary | ICD-10-CM

## 2023-06-29 DIAGNOSIS — Z113 Encounter for screening for infections with a predominantly sexual mode of transmission: Secondary | ICD-10-CM

## 2023-06-29 DIAGNOSIS — Z0184 Encounter for antibody response examination: Secondary | ICD-10-CM

## 2023-06-29 DIAGNOSIS — D508 Other iron deficiency anemias: Secondary | ICD-10-CM

## 2023-06-29 DIAGNOSIS — Z3A12 12 weeks gestation of pregnancy: Secondary | ICD-10-CM

## 2023-06-29 DIAGNOSIS — T7589XA Other specified effects of external causes, initial encounter: Secondary | ICD-10-CM

## 2023-06-29 DIAGNOSIS — Z1379 Encounter for other screening for genetic and chromosomal anomalies: Secondary | ICD-10-CM

## 2023-06-29 DIAGNOSIS — Z0283 Encounter for blood-alcohol and blood-drug test: Secondary | ICD-10-CM

## 2023-06-29 DIAGNOSIS — O99840 Bariatric surgery status complicating pregnancy, unspecified trimester: Secondary | ICD-10-CM

## 2023-06-29 LAB — OB RESULTS CONSOLE VARICELLA ZOSTER ANTIBODY, IGG: Varicella: IMMUNE

## 2023-06-29 MED ORDER — ASPIRIN 81 MG PO TBEC: 81.0000 mg | DELAYED_RELEASE_TABLET | Freq: Every day | ORAL | 2 refills | Status: DC

## 2023-06-29 NOTE — Progress Notes (Signed)
NEW OB HISTORY AND PHYSICAL  SUBJECTIVE:       Wendy Johnston is a 32 y.o. G5P1001 female, Patient's last menstrual period was 04/03/2023 (exact date)., Estimated Date of Delivery: 01/08/24, [redacted]w[redacted]d, confirmed by 9 week ultrasound presents today for establishment of Prenatal Care. She reports mild nausea and headaches.    Social history Partner/Relationship: FOB involved Living situation: lives with husband, toddler and father Work: Runner, broadcasting/film/video Exercise: walking several days a week Substance use: none   Gynecologic History Pregnancy dating reviewed:  Patient's last menstrual period was 04/03/2023 (exact date). Abnormal, conceived on Femara Contraception: none Last Pap: 03/2021. Results were: normal  Other Pertinent Health History: Gastric Bypass PCOS Gestational Hypertension Anemia Asthma  Obstetric History OB History  Gravida Para Term Preterm AB Living  2 1 1  0 0 1  SAB IAB Ectopic Multiple Live Births  0 0 0 0 1    # Outcome Date GA Lbr Len/2nd Weight Sex Type Anes PTL Lv  2 Current           1 Term 11/23/21   7 lb 4.8 oz (3.31 kg) M Vag-Spont   LIV    Past Medical History:  Diagnosis Date   Anemia    Anxiety    Asthma    Depression    PCOS (polycystic ovarian syndrome)    Uterine adenomyoma    JUVENILE CYSTIC    Past Surgical History:  Procedure Laterality Date   GASTRIC ROUX-EN-Y     LAPAROSCOPIC GASTRIC BYPASS  02/24/2017   WISDOM TOOTH EXTRACTION  2015    Current Outpatient Medications on File Prior to Visit  Medication Sig Dispense Refill   Albuterol-Budesonide 90-80 MCG/ACT AERO      letrozole (FEMARA) 2.5 MG tablet Take 1 tablet (2.5 mg total) by mouth daily. Take on Days 5-9 of cycle, monthly (Patient not taking: Reported on 05/18/2023) 30 tablet 1   montelukast (SINGULAIR) 10 MG tablet      Prenatal MV & Min w/FA-DHA (PRENATAL GUMMIES PO) Take by mouth.     prenatal vitamin w/FE, FA (NATACHEW) 29-1 MG CHEW chewable tablet Chew 2 tablets by mouth  daily at 12 noon.     PULMICORT FLEXHALER 180 MCG/ACT inhaler Inhale 1 puff into the lungs in the morning and at bedtime.     sertraline (ZOLOFT) 50 MG tablet Take 50 mg by mouth daily.     No current facility-administered medications on file prior to visit.    Allergies  Allergen Reactions   Nsaids     Patient has history of gastric bypass.      Social History   Socioeconomic History   Marital status: Married    Spouse name: FRANCOIS   Number of children: 1   Years of education: 16   Highest education level: Not on file  Occupational History   Occupation: THIRD GRADE SCHOOL TEACHER  Tobacco Use   Smoking status: Never   Smokeless tobacco: Never  Vaping Use   Vaping status: Never Used  Substance and Sexual Activity   Alcohol use: No   Drug use: No   Sexual activity: Yes    Partners: Male    Birth control/protection: None  Other Topics Concern   Not on file  Social History Narrative   Not on file   Social Determinants of Health   Financial Resource Strain: Low Risk  (06/01/2023)   Overall Financial Resource Strain (CARDIA)    Difficulty of Paying Living Expenses: Not hard at all  Food  Insecurity: No Food Insecurity (06/01/2023)   Hunger Vital Sign    Worried About Running Out of Food in the Last Year: Never true    Ran Out of Food in the Last Year: Never true  Transportation Needs: No Transportation Needs (06/01/2023)   PRAPARE - Administrator, Civil Service (Medical): No    Lack of Transportation (Non-Medical): No  Physical Activity: Sufficiently Active (06/01/2023)   Exercise Vital Sign    Days of Exercise per Week: 3 days    Minutes of Exercise per Session: 60 min  Stress: No Stress Concern Present (06/01/2023)   [employer] of Occupational Health - Occupational Stress Questionnaire    Feeling of Stress : Not at all  Social Connections: Moderately Integrated (06/01/2023)   Social Connection and Isolation Panel [NHANES]    Frequency of  Communication with Friends and Family: More than three times a week    Frequency of Social Gatherings with Friends and Family: Twice a week    Attends Religious Services: More than 4 times per year    Active Member of [employer] or Organizations: No    Attends Banker Meetings: Never    Marital Status: Married  Catering manager Violence: Not At Risk (06/01/2023)   Humiliation, Afraid, Rape, and Kick questionnaire    Fear of Current or Ex-Partner: No    Emotionally Abused: No    Physically Abused: No    Sexually Abused: No    Family History  Problem Relation Age of Onset   Cervical cancer Mother 44       total hysterectomy   Cancer Father 77       pancreatic   Healthy Brother    Diabetes Maternal Grandfather    Healthy Maternal Grandmother    Heart attack Paternal Grandmother    Heart attack Paternal Grandfather     The following portions of the patient's history were reviewed and updated as appropriate: allergies, current medications, past OB history, past medical history, past surgical history, past family history, past social history, and problem list.  Constitutional: Denied constitutional symptoms, night sweats, recent illness, fatigue, fever, insomnia and weight loss.  Eyes: Denied eye symptoms, eye pain, photophobia, vision change and visual disturbance.  Ears/Nose/Throat/Neck: Denied ear, nose, throat or neck symptoms, hearing loss, nasal discharge, sinus congestion and sore throat.  Cardiovascular: Denied cardiovascular symptoms, arrhythmia, chest pain/pressure, edema, exercise intolerance, orthopnea and palpitations.  Respiratory: Denied pulmonary symptoms, asthma, pleuritic pain, productive sputum, cough, dyspnea and wheezing.  Gastrointestinal: Mild nausea without vomiting  Genitourinary: Denied genitourinary symptoms including symptomatic vaginal discharge, pelvic relaxation issues, and urinary complaints.  Musculoskeletal: Denied musculoskeletal symptoms,  stiffness, swelling, muscle weakness and myalgia.  Dermatologic: Denied dermatology symptoms, rash and scar.  Neurologic: Denied neurology symptoms, dizziness, headache, neck pain and syncope.  Psychiatric: Denied psychiatric symptoms, anxiety and depression.  Endocrine: Denied endocrine symptoms including hot flashes and night sweats.     OBJECTIVE: Initial Physical Exam (New OB)  GENERAL APPEARANCE: alert, well appearing HEAD: normocephalic, atraumatic MOUTH: mucous membranes moist, pharynx normal without lesions THYROID: no thyromegaly or masses present BREASTS: not examined LUNGS: clear to auscultation, no wheezes, rales or rhonchi, symmetric air entry HEART: regular rate and rhythm, no murmurs ABDOMEN: soft, nontender, nondistended, no abnormal masses, no epigastric pain EXTREMITIES: no redness or tenderness in the calves or thighs SKIN: normal coloration and turgor, no rashes PELVIC EXAM deferred  ASSESSMENT/PLAN  Normal pregnancy  Gastric Bypass - Gastric bypass labs at [facility] appointment  PCOS/Obesity - Hgb A1c, thyroid panel, PIH baseline labs collected  Gestational Hypertension - With first pregnancy, diagnosed while in hospital at end of pregnancy.  - Recommended daily 81 mg ASA  Anemia - Seeing hematologist regularly, has gotten several Fe infusions  Asthma - Recently diagnosed after 4-5 bouts of pneumonia in last few years. Possibly COPD, seeing pulmonologist. Using inhaler daily.  Patient meets criteria for low dose ASA therapy.   Two or more of the following moderate risk indications: Obesity (body mass index >30 kg/m2)  and Personal risk factors (eg, previous pregnancy with low birth weight or small for gestational age infant, previous adverse pregnancy outcome [eg, stillbirth], interval >10 years between pregnancies)  Reviewed recommendation for low dose ASA, prescription provided per patient request.    Routine prenatal care. We discussed an overview of  prenatal care and when to call. Reviewed diet, exercise, and weight gain recommendations in pregnancy. Discussed benefits of breastfeeding and lactation resources at [facility]. I reviewed labs and answered all questions.  Anatomy ultrasound ordered for 18-20 weeks.  NOB labs: collected today with fetal genetic screening. Labs for PCOS/Hx of gastric bypass surgery also collected  See orders  Autumn Messing, [facility] 06/29/23 1:55 PM

## 2023-06-30 LAB — MICROSCOPIC EXAMINATION: Casts: NONE SEEN /[LPF]

## 2023-06-30 LAB — URINALYSIS, ROUTINE W REFLEX MICROSCOPIC
Bilirubin, UA: NEGATIVE
Ketones, UA: NEGATIVE
Nitrite, UA: NEGATIVE
Protein,UA: NEGATIVE
RBC, UA: NEGATIVE
Specific Gravity, UA: 1.013 (ref 1.005–1.030)
Urobilinogen, Ur: 0.2 mg/dL (ref 0.2–1.0)
pH, UA: 6 (ref 5.0–7.5)

## 2023-06-30 LAB — PROTEIN / CREATININE RATIO, URINE
Creatinine, Urine: 51.8 mg/dL
Protein, Ur: 6 mg/dL
Protein/Creat Ratio: 116 mg/g{creat} (ref 0–200)

## 2023-07-01 LAB — MONITOR DRUG PROFILE 14(MW)
Amphetamine Scrn, Ur: NEGATIVE ng/mL
BARBITURATE SCREEN URINE: NEGATIVE ng/mL
BENZODIAZEPINE SCREEN, URINE: NEGATIVE ng/mL
Buprenorphine, Urine: NEGATIVE ng/mL
CANNABINOIDS UR QL SCN: NEGATIVE ng/mL
Cocaine (Metab) Scrn, Ur: NEGATIVE ng/mL
Creatinine(Crt), U: 51.1 mg/dL (ref 20.0–300.0)
Fentanyl, Urine: NEGATIVE pg/mL
Meperidine Screen, Urine: NEGATIVE ng/mL
Methadone Screen, Urine: NEGATIVE ng/mL
OXYCODONE+OXYMORPHONE UR QL SCN: NEGATIVE ng/mL
Opiate Scrn, Ur: NEGATIVE ng/mL
Ph of Urine: 5.6 (ref 4.5–8.9)
Phencyclidine Qn, Ur: NEGATIVE ng/mL
Propoxyphene Scrn, Ur: NEGATIVE ng/mL
SPECIFIC GRAVITY: 1.009
Tramadol Screen, Urine: NEGATIVE ng/mL

## 2023-07-01 LAB — CULTURE, OB URINE

## 2023-07-01 LAB — CERVICOVAGINAL ANCILLARY ONLY
Chlamydia: NEGATIVE
Comment: NEGATIVE
Comment: NORMAL
Neisseria Gonorrhea: NEGATIVE

## 2023-07-01 LAB — NICOTINE SCREEN, URINE: Cotinine Ql Scrn, Ur: NEGATIVE ng/mL

## 2023-07-01 LAB — URINE CULTURE, OB REFLEX

## 2023-07-03 LAB — COMPREHENSIVE METABOLIC PANEL
ALT: 11 [IU]/L (ref 0–32)
AST: 20 [IU]/L (ref 0–40)
Albumin: 3.7 g/dL — ABNORMAL LOW (ref 3.9–4.9)
Alkaline Phosphatase: 47 [IU]/L (ref 44–121)
BUN/Creatinine Ratio: 17 (ref 9–23)
BUN: 10 mg/dL (ref 6–20)
Bilirubin Total: <0.2 mg/dL (ref 0.0–1.2)
CO2: 19 mmol/L — ABNORMAL LOW (ref 20–29)
Calcium: 8.8 mg/dL (ref 8.7–10.2)
Chloride: 105 mmol/L (ref 96–106)
Creatinine, Ser: 0.59 mg/dL (ref 0.57–1.00)
Globulin, Total: 2.3 g/dL (ref 1.5–4.5)
Glucose: 107 mg/dL — ABNORMAL HIGH (ref 70–99)
Potassium: 3.9 mmol/L (ref 3.5–5.2)
Sodium: 136 mmol/L (ref 134–144)
Total Protein: 6 g/dL (ref 6.0–8.5)
eGFR: 123 mL/min/{1.73_m2} (ref >59)

## 2023-07-03 LAB — CBC/D/PLT+RPR+RH+ABO+RUBIGG...
Antibody Screen: NEGATIVE
Basophils Absolute: 0 10*3/uL (ref 0.0–0.2)
Basos: 0 %
EOS (ABSOLUTE): 0 10*3/uL (ref 0.0–0.4)
Eos: 0 %
HCV Ab: NONREACTIVE
HIV Screen 4th Generation wRfx: NONREACTIVE
Hematocrit: 37.5 % (ref 34.0–46.6)
Hemoglobin: 12 g/dL (ref 11.1–15.9)
Hepatitis B Surface Ag: NEGATIVE
Immature Grans (Abs): 0.1 10*3/uL (ref 0.0–0.1)
Immature Granulocytes: 1 %
Lymphocytes Absolute: 1.2 10*3/uL (ref 0.7–3.1)
Lymphs: 14 %
MCH: 27.7 pg (ref 26.6–33.0)
MCHC: 32 g/dL (ref 31.5–35.7)
MCV: 87 fL (ref 79–97)
Monocytes Absolute: 0.5 10*3/uL (ref 0.1–0.9)
Monocytes: 7 %
Neutrophils Absolute: 6.6 10*3/uL (ref 1.4–7.0)
Neutrophils: 78 %
Platelets: 276 10*3/uL (ref 150–450)
RBC: 4.33 x10E6/uL (ref 3.77–5.28)
RDW: 13.1 % (ref 11.7–15.4)
RPR Ser Ql: NONREACTIVE
Rh Factor: POSITIVE
Rubella Antibodies, IGG: 1.22 {index} (ref >0.99)
Varicella zoster IgG: REACTIVE
WBC: 8.4 10*3/uL (ref 3.4–10.8)

## 2023-07-03 LAB — HCV INTERPRETATION

## 2023-07-03 LAB — FOLATE: Folate: 13.7 ng/mL (ref >3.0)

## 2023-07-03 LAB — MATERNIT 21 PLUS CORE, BLOOD
Fetal Fraction: 23
Result (T21): NEGATIVE
Trisomy 13 (Patau syndrome): NEGATIVE
Trisomy 18 (Edwards syndrome): NEGATIVE
Trisomy 21 (Down syndrome): NEGATIVE

## 2023-07-03 LAB — HEMOGLOBIN A1C
Est. average glucose Bld gHb Est-mCnc: 100 mg/dL
Hgb A1c MFr Bld: 5.1 % (ref 4.8–5.6)

## 2023-07-03 LAB — VITAMIN D 25 HYDROXY (VIT D DEFICIENCY, FRACTURES): Vit D, 25-Hydroxy: 37 ng/mL (ref 30.0–100.0)

## 2023-07-03 LAB — VITAMIN B12: Vitamin B-12: 411 pg/mL (ref 232–1245)

## 2023-07-03 LAB — TSH+FREE T4
Free T4: 1.15 ng/dL (ref 0.82–1.77)
TSH: 1.74 u[IU]/mL (ref 0.450–4.500)

## 2023-07-03 LAB — VITAMIN B1: Thiamine: 104.9 nmol/L (ref 66.5–200.0)

## 2023-07-03 LAB — FERRITIN: Ferritin: 22 ng/mL (ref 15–150)

## 2023-07-27 ENCOUNTER — Ambulatory Visit (INDEPENDENT_AMBULATORY_CARE_PROVIDER_SITE_OTHER): Payer: BC Managed Care – PPO | Admitting: Obstetrics

## 2023-07-27 ENCOUNTER — Encounter: Payer: Self-pay | Admitting: Obstetrics

## 2023-07-27 VITALS — BP 117/79 | HR 69 | Wt 167.5 lb

## 2023-07-27 DIAGNOSIS — Z1379 Encounter for other screening for genetic and chromosomal anomalies: Secondary | ICD-10-CM

## 2023-07-27 DIAGNOSIS — Z3480 Encounter for supervision of other normal pregnancy, unspecified trimester: Secondary | ICD-10-CM

## 2023-07-27 DIAGNOSIS — Z3482 Encounter for supervision of other normal pregnancy, second trimester: Secondary | ICD-10-CM

## 2023-07-27 DIAGNOSIS — Z3A16 16 weeks gestation of pregnancy: Secondary | ICD-10-CM

## 2023-07-27 LAB — POCT URINALYSIS DIPSTICK OB
Bilirubin, UA: NEGATIVE
Blood, UA: NEGATIVE
Glucose, UA: NEGATIVE
Ketones, UA: NEGATIVE
Leukocytes, UA: NEGATIVE
Nitrite, UA: NEGATIVE
Spec Grav, UA: 1.025 (ref 1.010–1.025)
Urobilinogen, UA: 0.2 U/dL
pH, UA: 6 (ref 5.0–8.0)

## 2023-07-27 NOTE — Progress Notes (Signed)
ROB [redacted]w[redacted]d: She is doing well. She reports fetal movement. She has no new concerns today.

## 2023-07-27 NOTE — Progress Notes (Signed)
Routine Prenatal Care Visit  Subjective  Wendy Johnston is a 32 y.o. G2P1001 at [redacted]w[redacted]d being seen today for ongoing prenatal care.  She is currently monitored for the following issues for this high-risk pregnancy and has Iron deficiency anemia; Previous gastric bypass affecting pregnancy, antepartum; Obesity affecting pregnancy; B12 deficiency; PCOS (polycystic ovarian syndrome); and Supervision of other normal pregnancy, antepartum on their problem list.  -----------------------------------------------------------------------------------Feels fetal movement.having a boy- named [employer]. Contractions: Not present. Vag. Bleeding: None.  Movement: Present. Leaking Fluid denies.  ----------------------------------------------------------------------------------- The following portions of the patient's history were reviewed and updated as appropriate: allergies, current medications, past family history, past medical history, past social history, past surgical history and problem list. Problem list updated.  Objective  Blood pressure 117/79, pulse 69, weight 167 lb 8 oz (76 kg), last menstrual period 04/03/2023, not currently breastfeeding. Pregravid weight 160 lb (72.6 kg) Total Weight Gain 7 lb 8 oz (3.402 kg) Urinalysis: Urine Protein    Urine Glucose    Fetal Status:     Movement: Present     General:  Alert, oriented and cooperative. Patient is in no acute distress.  Skin: Skin is warm and dry. No rash noted.   Cardiovascular: Normal heart rate noted  Respiratory: Normal respiratory effort, no problems with respiration noted  Abdomen: Soft, gravid, appropriate for gestational age. Pain/Pressure: Absent     Pelvic:  Cervical exam deferred        Extremities: Normal range of motion.  Edema: None  Mental Status: Normal mood and affect. Normal behavior. Normal judgment and thought content.   Assessment   32 y.o. G2P1001 at [redacted]w[redacted]d by  01/08/2024, by Last Menstrual Period presenting  for routine prenatal visit  Plan   second Problems (from 06/01/23 to present)     Problem Noted Resolved   Supervision of other normal pregnancy, antepartum 06/01/2023 by Loran Senters, CMA No   Overview Addendum 07/27/2023  4:06 PM by Mirna Mires, CNM     Clinical Staff Provider  Office Location  [facility] Dating  C/W LMP dating.  Language  English Anatomy US    Flu Vaccine  offer Genetic Screen  NIPS: xy, AFP-  TDaP vaccine   offer Hgb A1C or  GTT Early : Third trimester :   Covid One booster   LAB RESULTS   Rhogam     Blood Type   A+ 2022  RSV  Antibody  Negative  2022  Feeding Plan breast Rubella  Non reactive 2022  Contraception pill RPR   Immune 2022  Circumcision no HBsAg   Negative 2022  Pediatrician  [facility] HIV  Non reactive 2022  Support Person Niel Hummer Varicella  Immune 2022  Prenatal Classes yes GBS  (For PCN allergy, check sensitivities)     Hep C     BTL Consent  Pap Diagnosis  Date Value Ref Range Status  03/27/2021   Final   - Negative for intraepithelial lesion or malignancy (NILM)    VBAC Consent  Hgb Electro      CF      SMA                    Preterm labor symptoms and general obstetric precautions including but not limited to vaginal bleeding, contractions, leaking of fluid and fetal movement were reviewed in detail with the patient. Please refer to After Visit Summary for other counseling recommendations.  Having an AFP drawn today. Her anatomy scan is scheduled.  Return in about 4 weeks (around 08/24/2023) for return OB.  Mirna Mires, CNM  07/27/2023 4:06 PM

## 2023-07-30 ENCOUNTER — Encounter: Payer: Self-pay | Admitting: Oncology

## 2023-07-30 LAB — AFP, SERUM, OPEN SPINA BIFIDA
AFP MoM: 1.66
AFP Value: 53.6 ng/mL
Gest. Age on Collection Date: 16.4 wk
Maternal Age At EDD: 33.5 a
OSBR Risk 1 IN: 1802
Test Results:: NEGATIVE
Weight: 168 [lb_av]

## 2023-08-14 ENCOUNTER — Other Ambulatory Visit: Payer: Self-pay | Admitting: Pulmonary Disease

## 2023-08-14 ENCOUNTER — Encounter: Payer: Self-pay | Admitting: Oncology

## 2023-08-14 DIAGNOSIS — R0789 Other chest pain: Secondary | ICD-10-CM

## 2023-08-17 ENCOUNTER — Ambulatory Visit: Payer: BC Managed Care – PPO

## 2023-08-24 ENCOUNTER — Encounter: Payer: Self-pay | Admitting: Oncology

## 2023-08-24 ENCOUNTER — Ambulatory Visit (INDEPENDENT_AMBULATORY_CARE_PROVIDER_SITE_OTHER): Payer: BC Managed Care – PPO

## 2023-08-24 ENCOUNTER — Ambulatory Visit: Payer: BC Managed Care – PPO

## 2023-08-24 VITALS — BP 125/65 | HR 80 | Wt 171.0 lb

## 2023-08-24 DIAGNOSIS — Z3481 Encounter for supervision of other normal pregnancy, first trimester: Secondary | ICD-10-CM

## 2023-08-24 DIAGNOSIS — Z3482 Encounter for supervision of other normal pregnancy, second trimester: Secondary | ICD-10-CM | POA: Diagnosis not present

## 2023-08-24 DIAGNOSIS — Z3480 Encounter for supervision of other normal pregnancy, unspecified trimester: Secondary | ICD-10-CM

## 2023-08-24 DIAGNOSIS — Z3A20 20 weeks gestation of pregnancy: Secondary | ICD-10-CM

## 2023-08-24 NOTE — Assessment & Plan Note (Signed)
-   Normal anatomy ultrasound today: Fetal presentation is Cephalic.  EFW: 344g, 0lb 12 oz Placenta: posterior with placental cord insert visualized MVP: 6.4 cm   - Reviewed red flag warning signs anticipatory guidance for upcoming prenatal care.

## 2023-08-24 NOTE — Progress Notes (Signed)
    Return Prenatal Note   Assessment/Plan   Plan  32 y.o. G2P1001 at [redacted]w[redacted]d presents for follow-up OB visit. Reviewed prenatal record including previous visit note.  Supervision of other normal pregnancy, antepartum - Normal anatomy ultrasound today: Fetal presentation is Cephalic.  EFW: 344g, 0lb 12 oz Placenta: posterior with placental cord insert visualized MVP: 6.4 cm   - Reviewed red flag warning signs anticipatory guidance for upcoming prenatal care.    No orders of the defined types were placed in this encounter.  Return in about 4 weeks (around 09/21/2023) for ROB.   Future Appointments  Date Time Provider Department Center  08/24/2023  2:35 PM Chrissi Crow, Lindalou Hose, CNM [facility] None  08/27/2023  3:45 PM [facility] None  08/28/2023  4:30 PM Weston Settle, MD [facility] None    For next visit:  continue with routine prenatal care     Subjective   32 y.o. G2P1001 at [redacted]w[redacted]d presents for this follow-up prenatal visit.  Patient having some increased swelling that is exacerbating her carpal tunnel syndrome. She has ordered some new wrist splints that she hopes will help.  Patient reports: Movement: Present Contractions: Not present  Objective   Flow sheet Vitals: Pulse Rate: 80 BP: 125/65 Fundal Height: 20 cm Fetal Heart Rate (bpm): 145 Total weight gain: 11 lb (4.99 kg)  General Appearance  No acute distress, well appearing, and well nourished Pulmonary   Normal work of breathing Neurologic   Alert and oriented to person, place, and time Psychiatric   Mood and affect within normal limits  Lindalou Hose Glendale Wherry, CNM  12/16/241:42 PM

## 2023-08-27 ENCOUNTER — Inpatient Hospital Stay: Payer: BC Managed Care – PPO | Attending: Oncology

## 2023-08-27 ENCOUNTER — Other Ambulatory Visit: Payer: Self-pay

## 2023-08-27 DIAGNOSIS — D508 Other iron deficiency anemias: Secondary | ICD-10-CM

## 2023-08-27 DIAGNOSIS — D509 Iron deficiency anemia, unspecified: Secondary | ICD-10-CM | POA: Insufficient documentation

## 2023-08-27 DIAGNOSIS — Z9884 Bariatric surgery status: Secondary | ICD-10-CM | POA: Insufficient documentation

## 2023-08-27 LAB — CBC WITH DIFFERENTIAL (CANCER CENTER ONLY)
Abs Immature Granulocytes: 0.21 10*3/uL — ABNORMAL HIGH (ref 0.00–0.07)
Basophils Absolute: 0 10*3/uL (ref 0.0–0.1)
Basophils Relative: 0 %
Eosinophils Absolute: 0.1 10*3/uL (ref 0.0–0.5)
Eosinophils Relative: 0 %
HCT: 33.9 % — ABNORMAL LOW (ref 36.0–46.0)
Hemoglobin: 11.3 g/dL — ABNORMAL LOW (ref 12.0–15.0)
Immature Granulocytes: 2 %
Lymphocytes Relative: 14 %
Lymphs Abs: 1.5 10*3/uL (ref 0.7–4.0)
MCH: 25.9 pg — ABNORMAL LOW (ref 26.0–34.0)
MCHC: 33.3 g/dL (ref 30.0–36.0)
MCV: 77.6 fL — ABNORMAL LOW (ref 80.0–100.0)
Monocytes Absolute: 0.8 10*3/uL (ref 0.1–1.0)
Monocytes Relative: 7 %
Neutro Abs: 8.6 10*3/uL — ABNORMAL HIGH (ref 1.7–7.7)
Neutrophils Relative %: 77 %
Platelet Count: 308 10*3/uL (ref 150–400)
RBC: 4.37 MIL/uL (ref 3.87–5.11)
RDW: 12.9 % (ref 11.5–15.5)
WBC Count: 11.2 10*3/uL — ABNORMAL HIGH (ref 4.0–10.5)
nRBC: 0 % (ref 0.0–0.2)
nRBC: 0 /100{WBCs}

## 2023-08-27 LAB — FERRITIN: Ferritin: 6 ng/mL — ABNORMAL LOW (ref 11–307)

## 2023-08-27 LAB — IRON AND TIBC
Iron: 45 ug/dL (ref 28–170)
Saturation Ratios: 7 % — ABNORMAL LOW (ref 10.4–31.8)
TIBC: 638 ug/dL — ABNORMAL HIGH (ref 250–450)
UIBC: 593 ug/dL

## 2023-08-27 LAB — FOLATE: Folate: 16 ng/mL (ref >5.9)

## 2023-08-27 LAB — VITAMIN B12: Vitamin B-12: 234 pg/mL (ref 180–914)

## 2023-08-28 ENCOUNTER — Inpatient Hospital Stay (HOSPITAL_BASED_OUTPATIENT_CLINIC_OR_DEPARTMENT_OTHER): Payer: BC Managed Care – PPO | Admitting: Oncology

## 2023-08-28 ENCOUNTER — Other Ambulatory Visit: Payer: Self-pay | Admitting: Oncology

## 2023-08-28 DIAGNOSIS — D508 Other iron deficiency anemias: Secondary | ICD-10-CM

## 2023-08-28 DIAGNOSIS — D509 Iron deficiency anemia, unspecified: Secondary | ICD-10-CM | POA: Diagnosis not present

## 2023-08-28 NOTE — Progress Notes (Unsigned)
  [facility]  [address] [place],  [place]  [address] [phone]  Clinic Day:  02/26/2023  Referring physician: No ref. provider found  TELEPHONE VISIT  HISTORY OF PRESENT ILLNESS:  The patient is a 32 y.o. female  with iron deficiency anemia.  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  She also had gastric bypass surgery a few years ago.   Her telephone visit today is to reassess her labs.  Since her last visit, the patient has been doing well.  She denies having increased fatigue or any overt forms of blood loss which concern her for recurrent iron deficiency anemia.  PHYSICAL EXAM: deferred   LABS:    Latest Reference Range & Units 08/27/23 15:53  WBC 4.0 - 10.5 K/uL 11.2 (H)  RBC 3.87 - 5.11 MIL/uL 4.37  Hemoglobin 12.0 - 15.0 g/dL 13.0 (L)  HCT 86.5 - 78.4 % 33.9 (L)  MCV 80.0 - 100.0 fL 77.6 (L)  MCH 26.0 - 34.0 pg 25.9 (L)  MCHC 30.0 - 36.0 g/dL 69.6  RDW 29.5 - 28.4 % 12.9  Platelets 150 - 400 K/uL 308  nRBC 0.0 - 0.2 % 0 /100 WBC 0.00 0  Neutrophils % 77  Lymphocytes % 14  Monocytes Relative % 7  Eosinophil % 0  Basophil % 0  Immature Granulocytes % 2  (H): Data is abnormally high (L): Data is abnormally low  Latest Reference Range & Units 08/27/23 15:53  Iron 28 - 170 ug/dL 45  UIBC ug/dL 132  TIBC 440 - 102 ug/dL 725 (H)  Saturation Ratios 10.4 - 31.8 % 7 (L)  Ferritin 11 - 307 ng/mL 6 (L)  Folate >5.9 ng/mL 16.0  Vitamin B12 180 - 914 pg/mL 234  (H): Data is abnormally high (L): Data is abnormally low ASSESSMENT & PLAN:  A 32 y.o. female with iron deficiency anemia, likely related to her previous gastric bypass surgery.  When evaluating her recent labs, I am very pleased as her hemoglobin remains ideal.   Furthermore, all of her iron parameters remain normal.  Clinically, the patient claims to be doing very well.  As that is the case, I will reassess her  hematologic picture in another 6 months.  The patient understands all the plans discussed today and is in agreement with them.   Reg Bircher Kirby Funk, MD

## 2023-09-04 ENCOUNTER — Other Ambulatory Visit: Payer: Self-pay | Admitting: Pharmacist

## 2023-09-04 NOTE — Progress Notes (Signed)
Monoferric changed to feraheme due to insurance preference.

## 2023-09-07 ENCOUNTER — Encounter: Payer: Self-pay | Admitting: Oncology

## 2023-09-07 ENCOUNTER — Inpatient Hospital Stay: Payer: BC Managed Care – PPO

## 2023-09-07 VITALS — BP 118/67 | HR 93 | Temp 98.0°F | Resp 18 | Ht 67.0 in | Wt 172.1 lb

## 2023-09-07 DIAGNOSIS — E538 Deficiency of other specified B group vitamins: Secondary | ICD-10-CM

## 2023-09-07 DIAGNOSIS — D509 Iron deficiency anemia, unspecified: Secondary | ICD-10-CM | POA: Diagnosis not present

## 2023-09-07 MED ORDER — LORATADINE 10 MG PO TABS
10.0000 mg | ORAL_TABLET | Freq: Once | ORAL | Status: AC
Start: 2023-09-07 — End: 2023-09-07
  Administered 2023-09-07: 10 mg via ORAL
  Filled 2023-09-07: qty 1

## 2023-09-07 MED ORDER — SODIUM CHLORIDE 0.9 % IV SOLN
510.0000 mg | Freq: Once | INTRAVENOUS | Status: AC
  Administered 2023-09-07: 510 mg via INTRAVENOUS
  Filled 2023-09-07: qty 510

## 2023-09-07 MED ORDER — SODIUM CHLORIDE 0.9 % IV SOLN: INTRAVENOUS | Status: DC

## 2023-09-07 NOTE — Patient Instructions (Signed)
 Ferumoxytol Injection What is this medication? FERUMOXYTOL (FER ue MOX i tol) treats low levels of iron in your body (iron deficiency anemia). Iron is a mineral that plays an important role in making red blood cells, which carry oxygen from your lungs to the rest of your body. This medicine may be used for other purposes; ask your health care provider or pharmacist if you have questions. COMMON BRAND NAME(S): Feraheme What should I tell my care team before I take this medication? They need to know if you have any of these conditions: Anemia not caused by low iron levels High levels of iron in the blood Magnetic resonance imaging (MRI) test scheduled An unusual or allergic reaction to iron, other medications, foods, dyes, or preservatives Pregnant or trying to get pregnant Breastfeeding How should I use this medication? This medication is injected into a vein. It is given by your care team in a hospital or clinic setting. Talk to your care team the use of this medication in children. Special care may be needed. Overdosage: If you think you have taken too much of this medicine contact a poison control center or emergency room at once. NOTE: This medicine is only for you. Do not share this medicine with others. What if I miss a dose? It is important not to miss your dose. Call your care team if you are unable to keep an appointment. What may interact with this medication? Other iron products This list may not describe all possible interactions. Give your health care provider a list of all the medicines, herbs, non-prescription drugs, or dietary supplements you use. Also tell them if you smoke, drink alcohol, or use illegal drugs. Some items may interact with your medicine. What should I watch for while using this medication? Visit your care team regularly. Tell your care team if your symptoms do not start to get better or if they get worse. You may need blood work done while you are taking this  medication. You may need to follow a special diet. Talk to your care team. Foods that contain iron include: whole grains/cereals, dried fruits, beans, or peas, leafy green vegetables, and organ meats (liver, kidney). What side effects may I notice from receiving this medication? Side effects that you should report to your care team as soon as possible: Allergic reactions--skin rash, itching, hives, swelling of the face, lips, tongue, or throat Low blood pressure--dizziness, feeling faint or lightheaded, blurry vision Shortness of breath Side effects that usually do not require medical attention (report to your care team if they continue or are bothersome): Flushing Headache Joint pain Muscle pain Nausea Pain, redness, or irritation at injection site This list may not describe all possible side effects. Call your doctor for medical advice about side effects. You may report side effects to FDA at 1-800-FDA-1088. Where should I keep my medication? This medication is given in a hospital or clinic. It will not be stored at home. NOTE: This sheet is a summary. It may not cover all possible information. If you have questions about this medicine, talk to your doctor, pharmacist, or health care provider.  2024 Elsevier/Gold Standard (2023-01-30 00:00:00)

## 2023-09-09 NOTE — L&D Delivery Note (Signed)
         Delivery Note   Wendy Johnston is a 33 y.o. G2P1001 at [redacted]w[redacted]d Estimated Date of Delivery: 01/08/24  PRE-OPERATIVE DIAGNOSIS:  1) [redacted]w[redacted]d pregnancy. Gestational Hypertension    POST-OPERATIVE DIAGNOSIS:  1) [redacted]w[redacted]d pregnancy s/p Vaginal, Spontaneous Gestational Hypertension  Delivery Type: Vaginal, Spontaneous   Delivery Anesthesia: None  Labor Complications:  none    ESTIMATED BLOOD LOSS: 400 ml    FINDINGS:   1) female infant, Apgar scores of 8 at 1 minute and 9 at 5 minutes and a birthweight is pending, infant skin to skin.    2) Nuchal cord: no  SPECIMENS:   PLACENTA:   Appearance: Intact, 3 vessel cord   Removal: Spontaneous     Disposition:  per protocol   DISPOSITION:  Infant to left in stable condition in the delivery room, with L&D personnel and mother,  NARRATIVE SUMMARY: Labor course:  [name] is a G2P1001 at [redacted]w[redacted]d who presented for induction of labor.  She progressed well in labor with pitocin .  She received no anesthesia and proceeded to complete dilation. She evidenced good maternal expulsive effort during the second stage. She went on to deliver a viable female infant "Wendy Johnston" . The placenta delivered without problems and was noted to be complete. A perineal and vaginal examination was performed. Episiotomy/Lacerations: right periurethral and 1st degree vaginal , hemostatic no repair needed.  The patient tolerated this well.  Alise Appl, CNM  12/26/2023 6:51 PM

## 2023-09-11 ENCOUNTER — Inpatient Hospital Stay: Payer: 59 | Attending: Oncology

## 2023-09-11 ENCOUNTER — Encounter: Payer: Self-pay | Admitting: Oncology

## 2023-09-11 VITALS — BP 117/70 | HR 78 | Temp 97.5°F | Resp 16

## 2023-09-11 DIAGNOSIS — E538 Deficiency of other specified B group vitamins: Secondary | ICD-10-CM

## 2023-09-11 DIAGNOSIS — Z79899 Other long term (current) drug therapy: Secondary | ICD-10-CM | POA: Diagnosis not present

## 2023-09-11 DIAGNOSIS — D509 Iron deficiency anemia, unspecified: Secondary | ICD-10-CM | POA: Insufficient documentation

## 2023-09-11 MED ORDER — ACETAMINOPHEN 325 MG PO TABS: 650.0000 mg | ORAL_TABLET | Freq: Once | ORAL | Status: DC

## 2023-09-11 MED ORDER — SODIUM CHLORIDE 0.9 % IV SOLN: INTRAVENOUS | Status: DC

## 2023-09-11 MED ORDER — FERUMOXYTOL INJECTION 510 MG/17 ML
510.0000 mg | Freq: Once | INTRAVENOUS | Status: AC
  Administered 2023-09-11: 510 mg via INTRAVENOUS
  Filled 2023-09-11: qty 510

## 2023-09-11 MED ORDER — LORATADINE 10 MG PO TABS: 10.0000 mg | ORAL_TABLET | Freq: Once | ORAL | Status: DC

## 2023-09-14 ENCOUNTER — Ambulatory Visit: Payer: BC Managed Care – PPO

## 2023-09-15 ENCOUNTER — Encounter: Payer: Self-pay | Admitting: Oncology

## 2023-09-21 ENCOUNTER — Ambulatory Visit: Payer: BC Managed Care – PPO | Admitting: Certified Nurse Midwife

## 2023-09-21 VITALS — BP 119/65 | HR 84 | Wt 176.6 lb

## 2023-09-21 DIAGNOSIS — Z3480 Encounter for supervision of other normal pregnancy, unspecified trimester: Secondary | ICD-10-CM

## 2023-09-21 DIAGNOSIS — O99012 Anemia complicating pregnancy, second trimester: Secondary | ICD-10-CM

## 2023-09-21 DIAGNOSIS — Z3A24 24 weeks gestation of pregnancy: Secondary | ICD-10-CM

## 2023-09-21 DIAGNOSIS — D509 Iron deficiency anemia, unspecified: Secondary | ICD-10-CM

## 2023-09-21 DIAGNOSIS — Z114 Encounter for screening for human immunodeficiency virus [HIV]: Secondary | ICD-10-CM

## 2023-09-21 DIAGNOSIS — O99840 Bariatric surgery status complicating pregnancy, unspecified trimester: Secondary | ICD-10-CM

## 2023-09-21 DIAGNOSIS — Z113 Encounter for screening for infections with a predominantly sexual mode of transmission: Secondary | ICD-10-CM

## 2023-09-21 DIAGNOSIS — D508 Other iron deficiency anemias: Secondary | ICD-10-CM

## 2023-09-21 LAB — POCT URINALYSIS DIPSTICK
Bilirubin, UA: NEGATIVE
Blood, UA: NEGATIVE
Glucose, UA: NEGATIVE
Ketones, UA: NEGATIVE
Leukocytes, UA: NEGATIVE
Nitrite, UA: NEGATIVE
Protein, UA: NEGATIVE
Spec Grav, UA: 1.02 (ref 1.010–1.025)
Urobilinogen, UA: 0.2 U/dL
pH, UA: 7 (ref 5.0–8.0)

## 2023-09-21 NOTE — Progress Notes (Signed)
    Return Prenatal Note   Subjective   33 y.o. G2P1001 at [redacted]w[redacted]d presents for this follow-up prenatal visit.  Patient feeling well, active baby. Having some back pain, carpal tunnel has improved with dietary changes. Has a history of dumping syndrome and would prefer to check blood sugars instead of 1h glucola, has glucometer & supplies. Patient reports: Movement: Present Contractions: Irritability  Objective   Flow sheet Vitals: Pulse Rate: 84 BP: 119/65 Fundal Height: 24 cm Fetal Heart Rate (bpm): 150 Total weight gain: 16 lb 9.6 oz (7.53 kg)  General Appearance  No acute distress, well appearing, and well nourished Pulmonary   Normal work of breathing Neurologic   Alert and oriented to person, place, and time Psychiatric   Mood and affect within normal limits  Assessment/Plan   Plan  33 y.o. G2P1001 at [redacted]w[redacted]d presents for follow-up OB visit. Reviewed prenatal record including previous visit note.  Previous gastric bypass affecting pregnancy, antepartum Will repeat gastric bypass labs at ~28w, orders placed.  Iron  deficiency anemia Recently received iron  infusion, CBC and labs at next visit.  Supervision of other normal pregnancy, antepartum Red flag symptoms reviewed. Anticipatory guidance for upcoming prenatal care discussed, will complete qid blood sugar monitoring for 10d prior to next visit due to risk of dumping syndrome.  Hx of low supply with breastfeeding, took reglan  without improvement.      Orders Placed This Encounter  Procedures   CBC    Standing Status:   Future    Expected Date:   10/19/2023    Expiration Date:   09/20/2024   Comprehensive metabolic panel    Standing Status:   Future    Expected Date:   10/19/2023    Expiration Date:   09/20/2024   HIV Antibody (routine testing w rflx)    Standing Status:   Future    Expected Date:   10/19/2023    Expiration Date:   09/20/2024   RPR    Standing Status:   Future    Expected Date:   10/19/2023     Expiration Date:   09/20/2024   VITAMIN D  25 Hydroxy (Vit-D Deficiency, Fractures)    Standing Status:   Future    Expected Date:   10/19/2023    Expiration Date:   09/20/2024   Ferritin    Standing Status:   Future    Expected Date:   10/19/2023    Expiration Date:   09/20/2024   Vitamin B1    Standing Status:   Future    Expected Date:   10/19/2023    Expiration Date:   09/20/2024   B12 and Folate Panel    Standing Status:   Future    Expected Date:   10/19/2023    Expiration Date:   09/20/2024   POCT Urinalysis Dipstick   Return in 4 weeks (on 10/19/2023) for ROB & third trimester labs.   Future Appointments  Date Time Provider Department Center  10/19/2023  3:55 PM Justino Eleanor HERO, CNM [facility] None    For next visit:  continue with routine prenatal care     Harlene LITTIE Cisco, CNM  01/13/254:39 PM

## 2023-09-21 NOTE — Patient Instructions (Signed)

## 2023-09-21 NOTE — Assessment & Plan Note (Signed)
 Red flag symptoms reviewed. Anticipatory guidance for upcoming prenatal care discussed, will complete qid blood sugar monitoring for 10d prior to next visit due to risk of dumping syndrome.  Hx of low supply with breastfeeding, took reglan  without improvement.

## 2023-09-21 NOTE — Assessment & Plan Note (Signed)
 Will repeat gastric bypass labs at ~28w, orders placed.

## 2023-09-21 NOTE — Assessment & Plan Note (Signed)
 Recently received iron infusion, CBC and labs at next visit.

## 2023-10-19 ENCOUNTER — Ambulatory Visit (INDEPENDENT_AMBULATORY_CARE_PROVIDER_SITE_OTHER): Payer: 59 | Admitting: Obstetrics

## 2023-10-19 ENCOUNTER — Encounter: Payer: Self-pay | Admitting: Obstetrics

## 2023-10-19 VITALS — BP 128/79 | HR 102 | Wt 188.0 lb

## 2023-10-19 DIAGNOSIS — O99713 Diseases of the skin and subcutaneous tissue complicating pregnancy, third trimester: Secondary | ICD-10-CM | POA: Diagnosis not present

## 2023-10-19 DIAGNOSIS — O99843 Bariatric surgery status complicating pregnancy, third trimester: Secondary | ICD-10-CM

## 2023-10-19 DIAGNOSIS — Z114 Encounter for screening for human immunodeficiency virus [HIV]: Secondary | ICD-10-CM

## 2023-10-19 DIAGNOSIS — Z3480 Encounter for supervision of other normal pregnancy, unspecified trimester: Secondary | ICD-10-CM

## 2023-10-19 DIAGNOSIS — Z23 Encounter for immunization: Secondary | ICD-10-CM | POA: Diagnosis not present

## 2023-10-19 DIAGNOSIS — L299 Pruritus, unspecified: Secondary | ICD-10-CM | POA: Insufficient documentation

## 2023-10-19 DIAGNOSIS — Z113 Encounter for screening for infections with a predominantly sexual mode of transmission: Secondary | ICD-10-CM

## 2023-10-19 DIAGNOSIS — Z3A28 28 weeks gestation of pregnancy: Secondary | ICD-10-CM

## 2023-10-19 DIAGNOSIS — O99840 Bariatric surgery status complicating pregnancy, unspecified trimester: Secondary | ICD-10-CM

## 2023-10-19 DIAGNOSIS — Z9884 Bariatric surgery status: Secondary | ICD-10-CM

## 2023-10-19 NOTE — Progress Notes (Signed)
 Glucose check numbers 2/1 68, 114, 128, 119 2/2 95, 164, 105, 95 2/3 101, 83, 115, 109 2/4 94, 113, 97, 102 2/5 91, 113, 114, 138 2/6 95, 82, 110 2/7 94, 95, 93, 112 2/8 93, 87, 93, 114 2/9 87, 81, 92, 101 2/10 94, 96, 122

## 2023-10-19 NOTE — Assessment & Plan Note (Signed)
-  CMP/Bile Acids drawn 2/10 -Treat with OTC emollients, hydrocortisone cream.

## 2023-10-19 NOTE — Assessment & Plan Note (Addendum)
-   Anticipatory guidance reviewed. Educated on kick counts and what to expect.  - Danger signs reviewed.  - Glucose labs reviewed- one elevated fasting, one to two elevated post prandials, otherwise normal. Ok to d/c glucose checks.  - 28 week labs collected  - Bariatric surgery labs collected - Birth plan handout reviewed. - Postpartum contraception reviewed. Desired contraception unknown at this time. Education sheet provided.  - Yeast infection. Declines swab, treating w/ OTC monistat. Offered diflucan -declined

## 2023-10-19 NOTE — Progress Notes (Addendum)
    Return Prenatal Note   Assessment/Plan   Plan  33 y.o. G2P1001 at [redacted]w[redacted]d presents for follow-up OB visit. Reviewed prenatal record including previous visit note.  Supervision of other normal pregnancy, antepartum - Anticipatory guidance reviewed. Educated on kick counts and what to expect.  - Danger signs reviewed.  - Glucose labs reviewed- one elevated fasting, one to two elevated post prandials, otherwise normal. Ok to d/c glucose checks.  - 28 week labs collected  - Bariatric surgery labs collected - Birth plan handout reviewed. - Postpartum contraception reviewed. Desired contraception unknown at this time. Education sheet provided.  - Yeast infection. Declines swab, treating w/ OTC monistat. Offered diflucan -declined    Pruritus -CMP/Bile Acids drawn 2/10 -Treat with OTC emollients, hydrocortisone cream.    Orders Placed This Encounter  Procedures   US  OB Follow Up    Standing Status:   Future    Expected Date:   11/02/2023    Expiration Date:   10/18/2024    Reason for Exam (SYMPTOM  OR DIAGNOSIS REQUIRED):   growth    Preferred Imaging Location?:   Internal   Tdap vaccine greater than or equal to 7yo IM   Bile acids, total   Return in about 2 weeks (around 11/02/2023).   Future Appointments  Date Time Provider Department Center  11/06/2023 11:15 AM [facility] US  1 [facility] None  11/06/2023  1:15 PM Free, Verita Glassman, CNM [facility] None    For next visit:  Routine prenatal care, growth scan    Subjective  Feeling well overall, complaining of itching that has been going on since 2/5. Itching is present on the tops of her feet, in between her toes, ad on the insides of her wrists. It is worse at night. She has not tried any remedies to relieve the itching.  Yeast infection s/p abx use 2 weeks ago for sinus infection.   Movement: Present Contractions: Not present  Objective   Flow sheet Vitals: Pulse Rate: (!) 102 BP: 128/79 Fundal Height: 30 cm Fetal Heart Rate  (bpm): 140 Total weight gain: 12.7 kg  General Appearance  No acute distress, well appearing, and well nourished Pulmonary   Normal work of breathing Neurologic   Alert and oriented to person, place, and time Psychiatric   Mood and affect within normal limits  Rosealee Concha, [facility] 10/19/23 4:56 PM

## 2023-10-22 LAB — CBC
Hematocrit: 36.3 % (ref 34.0–46.6)
Hemoglobin: 11.8 g/dL (ref 11.1–15.9)
MCH: 27.7 pg (ref 26.6–33.0)
MCHC: 32.5 g/dL (ref 31.5–35.7)
MCV: 85 fL (ref 79–97)
Platelets: 209 10*3/uL (ref 150–450)
RBC: 4.26 x10E6/uL (ref 3.77–5.28)
RDW: 16.5 % — ABNORMAL HIGH (ref 11.7–15.4)
WBC: 9.5 10*3/uL (ref 3.4–10.8)

## 2023-10-22 LAB — BILE ACIDS, TOTAL: Bile Acids Total: 3.3 umol/L (ref 0.0–10.0)

## 2023-10-22 LAB — COMPREHENSIVE METABOLIC PANEL
ALT: 8 [IU]/L (ref 0–32)
AST: 16 [IU]/L (ref 0–40)
Albumin: 3.5 g/dL — ABNORMAL LOW (ref 3.9–4.9)
Alkaline Phosphatase: 57 [IU]/L (ref 44–121)
BUN/Creatinine Ratio: 18 (ref 9–23)
BUN: 8 mg/dL (ref 6–20)
Bilirubin Total: <0.2 mg/dL (ref 0.0–1.2)
CO2: 15 mmol/L — ABNORMAL LOW (ref 20–29)
Calcium: 8.8 mg/dL (ref 8.7–10.2)
Chloride: 107 mmol/L — ABNORMAL HIGH (ref 96–106)
Creatinine, Ser: 0.44 mg/dL — ABNORMAL LOW (ref 0.57–1.00)
Globulin, Total: 2.3 g/dL (ref 1.5–4.5)
Glucose: 130 mg/dL — ABNORMAL HIGH (ref 70–99)
Potassium: 3.6 mmol/L (ref 3.5–5.2)
Sodium: 139 mmol/L (ref 134–144)
Total Protein: 5.8 g/dL — ABNORMAL LOW (ref 6.0–8.5)
eGFR: 132 mL/min/{1.73_m2} (ref >59)

## 2023-10-22 LAB — RPR: RPR Ser Ql: NONREACTIVE

## 2023-10-22 LAB — HIV ANTIBODY (ROUTINE TESTING W REFLEX): HIV Screen 4th Generation wRfx: NONREACTIVE

## 2023-10-22 LAB — FERRITIN: Ferritin: 70 ng/mL (ref 15–150)

## 2023-10-22 LAB — VITAMIN D 25 HYDROXY (VIT D DEFICIENCY, FRACTURES): Vit D, 25-Hydroxy: 33.1 ng/mL (ref 30.0–100.0)

## 2023-10-22 LAB — VITAMIN B1: Thiamine: 110.8 nmol/L (ref 66.5–200.0)

## 2023-10-22 LAB — B12 AND FOLATE PANEL
Folate: >20 ng/mL (ref >3.0)
Vitamin B-12: 244 pg/mL (ref 232–1245)

## 2023-10-23 ENCOUNTER — Encounter: Payer: Self-pay | Admitting: Certified Nurse Midwife

## 2023-11-03 ENCOUNTER — Encounter: Payer: 59 | Admitting: Certified Nurse Midwife

## 2023-11-06 ENCOUNTER — Other Ambulatory Visit (HOSPITAL_COMMUNITY)
Admission: RE | Admit: 2023-11-06 | Discharge: 2023-11-06 | Disposition: A | Discharge status: Home / Self Care | Source: Ambulatory Visit

## 2023-11-06 ENCOUNTER — Ambulatory Visit: Payer: 59

## 2023-11-06 VITALS — BP 123/77 | HR 76 | Wt 188.0 lb

## 2023-11-06 DIAGNOSIS — B379 Candidiasis, unspecified: Secondary | ICD-10-CM

## 2023-11-06 DIAGNOSIS — Z3A32 32 weeks gestation of pregnancy: Secondary | ICD-10-CM

## 2023-11-06 DIAGNOSIS — O99713 Diseases of the skin and subcutaneous tissue complicating pregnancy, third trimester: Secondary | ICD-10-CM

## 2023-11-06 DIAGNOSIS — B3731 Acute candidiasis of vulva and vagina: Secondary | ICD-10-CM | POA: Insufficient documentation

## 2023-11-06 DIAGNOSIS — O23593 Infection of other part of genital tract in pregnancy, third trimester: Secondary | ICD-10-CM | POA: Insufficient documentation

## 2023-11-06 DIAGNOSIS — Z9884 Bariatric surgery status: Secondary | ICD-10-CM

## 2023-11-06 DIAGNOSIS — O403XX0 Polyhydramnios, third trimester, not applicable or unspecified: Secondary | ICD-10-CM

## 2023-11-06 DIAGNOSIS — O99843 Bariatric surgery status complicating pregnancy, third trimester: Secondary | ICD-10-CM | POA: Diagnosis not present

## 2023-11-06 DIAGNOSIS — Z3A31 31 weeks gestation of pregnancy: Secondary | ICD-10-CM | POA: Diagnosis not present

## 2023-11-06 DIAGNOSIS — L299 Pruritus, unspecified: Secondary | ICD-10-CM

## 2023-11-06 DIAGNOSIS — Z3480 Encounter for supervision of other normal pregnancy, unspecified trimester: Secondary | ICD-10-CM

## 2023-11-06 DIAGNOSIS — O409XX0 Polyhydramnios, unspecified trimester, not applicable or unspecified: Secondary | ICD-10-CM | POA: Insufficient documentation

## 2023-11-06 MED ORDER — FLUCONAZOLE 150 MG PO TABS: 150.0000 mg | ORAL_TABLET | Freq: Once | ORAL | 2 refills | Status: AC

## 2023-11-06 NOTE — Assessment & Plan Note (Signed)
-   Daily benadryl providing relief from itching. Provided reassurance of safety to continue using.

## 2023-11-06 NOTE — Progress Notes (Signed)
    Return Prenatal Note   Assessment/Plan   Plan  33 y.o. G2P1001 at [redacted]w[redacted]d presents for follow-up OB visit. Reviewed prenatal record including previous visit note.  Polyhydramnios affecting pregnancy - Growth [place] today shows AFI of 24.5 and MVP of 9.5. New diagnosis of polyhydramnios.  - BPP ordered for next week. - Given new diagnosis of poly, recommend continuing to monitor blood sugars.   Supervision of other normal pregnancy, antepartum - Self swab for yeast. Has continued to have symptoms of yeast infection and Monistat has not fully relieved. Provided with Rx for diflucan.  - Reviewed kick counts and preterm labor warning signs. Instructed to call office or come to hospital with persistent headache, vision changes, regular contractions, leaking of fluid, decreased fetal movement or vaginal bleeding.  Pruritus - Daily benadryl providing relief from itching. Provided reassurance of safety to continue using.   Orders Placed This Encounter  Procedures   US FETAL BPP WO NON STRESS    Standing Status:   Future    Expected Date:   11/13/2023    Expiration Date:   11/05/2024    Reason for Exam (SYMPTOM  OR DIAGNOSIS REQUIRED):   polyhydramnios    Preferred Imaging Location?:   Internal   Return in about 2 weeks (around 11/20/2023) for ROB.   Future Appointments  Date Time Provider Department Center  11/06/2023  1:15 PM Perle Gibbon, Lindalou Hose, CNM [facility] None    For next visit:  continue with routine prenatal care     Subjective   33 y.o. G2P1001 at [redacted]w[redacted]d presents for this follow-up prenatal visit.  Patient has questions about continued yeast infection symptoms and use of Benadryl for treating itching.  Patient reports: Movement: Present  Objective   Flow sheet Vitals: Pulse Rate: 76 BP: 123/77 Fundal Height: 34 cm Fetal Heart Rate (bpm): 155 from [place] Total weight gain: 28 lb (12.7 kg)  General Appearance  No acute distress, well appearing, and well  nourished Pulmonary   Normal work of breathing Neurologic   Alert and oriented to person, place, and time Psychiatric   Mood and affect within normal limits  Lindalou Hose Rollan Roger, CNM  11/05/2509:54 AM

## 2023-11-06 NOTE — Assessment & Plan Note (Signed)
-   Self swab for yeast. Has continued to have symptoms of yeast infection and Monistat has not fully relieved. Provided with Rx for diflucan.  - Reviewed kick counts and preterm labor warning signs. Instructed to call office or come to hospital with persistent headache, vision changes, regular contractions, leaking of fluid, decreased fetal movement or vaginal bleeding.

## 2023-11-06 NOTE — Assessment & Plan Note (Signed)
-   Growth [place] today shows AFI of 24.5 and MVP of 9.5. New diagnosis of polyhydramnios.  - BPP ordered for next week. - Given new diagnosis of poly, recommend continuing to monitor blood sugars.

## 2023-11-08 LAB — CERVICOVAGINAL ANCILLARY ONLY
Bacterial Vaginitis (gardnerella): NEGATIVE
Candida Glabrata: NEGATIVE
Candida Vaginitis: POSITIVE — AB
Comment: NEGATIVE
Comment: NEGATIVE
Comment: NEGATIVE
Comment: NEGATIVE
Trichomonas: NEGATIVE

## 2023-11-09 ENCOUNTER — Other Ambulatory Visit: Payer: Self-pay

## 2023-11-09 DIAGNOSIS — Z3480 Encounter for supervision of other normal pregnancy, unspecified trimester: Secondary | ICD-10-CM

## 2023-11-09 MED ORDER — SERTRALINE HCL 50 MG PO TABS: 50.0000 mg | ORAL_TABLET | Freq: Every day | ORAL | 11 refills | Status: AC

## 2023-11-12 ENCOUNTER — Ambulatory Visit: Payer: 59

## 2023-11-12 DIAGNOSIS — Z3A31 31 weeks gestation of pregnancy: Secondary | ICD-10-CM

## 2023-11-12 DIAGNOSIS — O403XX0 Polyhydramnios, third trimester, not applicable or unspecified: Secondary | ICD-10-CM

## 2023-11-18 ENCOUNTER — Other Ambulatory Visit: Payer: Self-pay

## 2023-11-18 DIAGNOSIS — O403XX0 Polyhydramnios, third trimester, not applicable or unspecified: Secondary | ICD-10-CM

## 2023-11-19 ENCOUNTER — Ambulatory Visit: Admitting: Certified Nurse Midwife

## 2023-11-19 ENCOUNTER — Ambulatory Visit (INDEPENDENT_AMBULATORY_CARE_PROVIDER_SITE_OTHER)

## 2023-11-19 VITALS — BP 132/78 | HR 74 | Wt 191.7 lb

## 2023-11-19 DIAGNOSIS — Z3480 Encounter for supervision of other normal pregnancy, unspecified trimester: Secondary | ICD-10-CM

## 2023-11-19 DIAGNOSIS — O403XX0 Polyhydramnios, third trimester, not applicable or unspecified: Secondary | ICD-10-CM | POA: Diagnosis not present

## 2023-11-19 DIAGNOSIS — Z3A32 32 weeks gestation of pregnancy: Secondary | ICD-10-CM

## 2023-11-19 DIAGNOSIS — O409XX0 Polyhydramnios, unspecified trimester, not applicable or unspecified: Secondary | ICD-10-CM

## 2023-11-19 NOTE — Progress Notes (Signed)
    Return Prenatal Note   Subjective   33 y.o. G2P1001 at [redacted]w[redacted]d presents for this follow-up prenatal visit.  Patient anxious regarding rising fluid level, today at 34cm, was 29 1w ago, 24 2w ago. In agreement with MFM referral. She reports more frequent and sometimes painful BH contractions. Reviewed blood sugars, has had 3/8 fasting elevated however she often eats a snack overnight. All 2h postprandials <120. Patient reports: Movement: Present Contractions: Not present  Objective   Flow sheet Vitals: Pulse Rate: 74 BP: 132/78 Fundal Height: 37 cm Fetal Heart Rate (bpm): 130 Total weight gain: 31 lb 11.2 oz (14.4 kg)  General Appearance  No acute distress, well appearing, and well nourished Pulmonary   Normal work of breathing Neurologic   Alert and oriented to person, place, and time Psychiatric   Mood and affect within normal limits  Indications: Biophysical Profile performed for indication of polyhydramnios   Findings:  Singleton intrauterine pregnancy is visualized with FHR at 121 bpm.    Fetal presentation is Cephalic.  Placenta: anterior Polyhydramnios AFI: 34.2 cm, MVP 10.9 cm   BPP Scoring: Movement: 2/2  Tone: 2/2  Breathing: 2/2  AFI: 2/2   Stomach and bladder are visualized and appear normal   Impression: 1. [redacted]w[redacted]d Viable Singleton Intrauterine pregnancy dated by previously established criteria. 2. Polyhydramnios AFI is 34.2 cm.  3. BPP is 8/8     Assessment/Plan   Plan  33 y.o. G2P1001 at [redacted]w[redacted]d presents for follow-up OB visit. Reviewed prenatal record including previous visit note.  Supervision of other normal pregnancy, antepartum Reviewed kick counts and preterm labor warning signs. Instructed to call office or come to hospital with persistent headache, vision changes, regular contractions, leaking of fluid, decreased fetal movement or vaginal bleeding. Continue qid blood sugar monitoring.  Polyhydramnios affecting pregnancy AFI today 34, MVP  10. MFM referral ordered, if able to schedule wtihin the next week will cancel BPP here. Discussed with Phyillis fluid volume may impact timing of recommended delivery. Reviewed that anatomy has been seen as normal, NIPT was also low risk and that we may not have an explanation for the increased fluid.      Orders Placed This Encounter  Procedures   [place] MFM OB DETAIL +14 WK    Standing Status:   Future    Expected Date:   11/20/2023    Expiration Date:   11/18/2024    Reason for Exam (SYMPTOM  OR DIAGNOSIS REQUIRED):   polyhydramnios, AFI now 34cm, was 24 wMVP 9.5 11/06/23; hx of gastric bypass    Preferred Location:   [facility]   Return in 1 week (on 11/26/2023) for BPP.   Future Appointments  Date Time Provider Department Center  11/20/2023  3:35 PM Dominica Severin, CNM [facility] None  11/24/2023  9:30 AM [facility] [place] 1 [facility] None  12/04/2023  1:15 PM Slaughterbeck, Irving Burton, CNM [facility] None    For next visit:   BPP, MFM referral placed     Dominica Severin, CNM  03/13/254:45 PM

## 2023-11-19 NOTE — Assessment & Plan Note (Signed)
 AFI today 34, MVP 10. MFM referral ordered, if able to schedule wtihin the next week will cancel BPP here. Discussed with Stuart fluid volume may impact timing of recommended delivery. Reviewed that anatomy has been seen as normal, NIPT was also low risk and that we may not have an explanation for the increased fluid.

## 2023-11-19 NOTE — Assessment & Plan Note (Signed)
 Reviewed kick counts and preterm labor warning signs. Instructed to call office or come to hospital with persistent headache, vision changes, regular contractions, leaking of fluid, decreased fetal movement or vaginal bleeding. Continue qid blood sugar monitoring.

## 2023-11-20 ENCOUNTER — Encounter: Payer: 59 | Admitting: Certified Nurse Midwife

## 2023-11-20 DIAGNOSIS — Z3480 Encounter for supervision of other normal pregnancy, unspecified trimester: Secondary | ICD-10-CM

## 2023-11-20 DIAGNOSIS — O409XX0 Polyhydramnios, unspecified trimester, not applicable or unspecified: Secondary | ICD-10-CM

## 2023-11-20 DIAGNOSIS — Z3A32 32 weeks gestation of pregnancy: Secondary | ICD-10-CM

## 2023-11-22 ENCOUNTER — Encounter: Payer: Self-pay | Admitting: Certified Nurse Midwife

## 2023-11-24 ENCOUNTER — Other Ambulatory Visit

## 2023-11-25 ENCOUNTER — Encounter: Payer: Self-pay | Admitting: *Deleted

## 2023-11-25 ENCOUNTER — Other Ambulatory Visit: Payer: Self-pay

## 2023-11-25 ENCOUNTER — Ambulatory Visit (HOSPITAL_BASED_OUTPATIENT_CLINIC_OR_DEPARTMENT_OTHER): Admitting: Maternal & Fetal Medicine

## 2023-11-25 ENCOUNTER — Ambulatory Visit: Admitting: *Deleted

## 2023-11-25 ENCOUNTER — Other Ambulatory Visit: Payer: Self-pay | Admitting: Certified Nurse Midwife

## 2023-11-25 ENCOUNTER — Other Ambulatory Visit: Payer: Self-pay | Admitting: *Deleted

## 2023-11-25 ENCOUNTER — Ambulatory Visit: Attending: Certified Nurse Midwife

## 2023-11-25 VITALS — BP 145/73 | HR 81

## 2023-11-25 VITALS — BP 126/66

## 2023-11-25 DIAGNOSIS — E282 Polycystic ovarian syndrome: Secondary | ICD-10-CM | POA: Diagnosis not present

## 2023-11-25 DIAGNOSIS — Z8279 Family history of other congenital malformations, deformations and chromosomal abnormalities: Secondary | ICD-10-CM | POA: Diagnosis not present

## 2023-11-25 DIAGNOSIS — O409XX0 Polyhydramnios, unspecified trimester, not applicable or unspecified: Secondary | ICD-10-CM

## 2023-11-25 DIAGNOSIS — Z3A33 33 weeks gestation of pregnancy: Secondary | ICD-10-CM | POA: Diagnosis not present

## 2023-11-25 DIAGNOSIS — Z3A35 35 weeks gestation of pregnancy: Secondary | ICD-10-CM | POA: Insufficient documentation

## 2023-11-25 DIAGNOSIS — O403XX0 Polyhydramnios, third trimester, not applicable or unspecified: Secondary | ICD-10-CM

## 2023-11-25 DIAGNOSIS — Z9884 Bariatric surgery status: Secondary | ICD-10-CM | POA: Diagnosis not present

## 2023-11-25 DIAGNOSIS — Z3480 Encounter for supervision of other normal pregnancy, unspecified trimester: Secondary | ICD-10-CM | POA: Diagnosis present

## 2023-11-25 NOTE — Progress Notes (Signed)
 Patient information  Patient Name: Wendy Johnston  Patient MRN:   [id number]  Referring practice: [facility] Referring Provider: Salomon Mast  MFM CONSULT  Wendy Johnston is a 33 y.o. G2P1001 at [redacted]w[redacted]d here for ultrasound and consultation. Patient Active Problem List   Diagnosis Date Noted   Family history of birth defect (FOB with esophageal atresia) 11/25/2023   Polyhydramnios affecting pregnancy 11/06/2023   Pruritus 10/19/2023   Supervision of other normal pregnancy, antepartum 06/01/2023   B12 deficiency 07/08/2021   Previous gastric bypass affecting pregnancy, antepartum 03/27/2021   Obesity affecting pregnancy 03/27/2021   Iron deficiency anemia 11/26/2020   PCOS (polycystic ovarian syndrome) 01/04/2017   Nadean LYNN Kirkland has a pregnancy with the complications mentioned in the problem list. During today's visit we focused on the following concerns:   RE polyhydramnios:   Polyhydramnios was seen on today's ultrasound. There cardiac anatomy appears normal. There is no evidence of chorioangioma. The fetal movement is normal without evidence of arthrogryposis. The fetal stomach and bladder appears normal. The fetal palate (although limited in views) and neck appears to be normal without evidence of mass. There is no evidence of of lower spine or pelvic abnormalities. Notably, the patients husband had to "have his esophagus connected after birth". I discussed this could have been esophageal atresia with or without a tracheal fistula. This is not a genetically inherited condition but is a multifactorial sporadic condition involving genetic predisposition and environmental exposures (alcohol, smoking, medications, infections, and agricultural chemicals). The presence of a normal appear stomach makes esophogeal atresia unlikely but a TEF is still possible although unlikely as well and there are no sonographic clues other than polyhydramnios.  The patient also did not do a glucose  challenge test due to her history of Roux-en-Y gastric surgery.  However, she performed 2 weeks of blood sugar assessment and nearly all values were normal both early in the pregnancy and during the time of a normal Glucola.  This makes glucose intolerance very unlikely to be the cause of polyhydramnios today.  I discussed the diagnosis, management, prognosis and clinical implications of polyhydramnios and the clinical implications during pregnancy. Amniotic fluid is made from the fetal kidneys and at the normal fluid ranges from 5 to 25 cm on amniotic fluid index.  Potential causes of elevated amniotic fluid in pregnancy. I discussed that most the time the cause is unknown but the most common cause that is identifiable is maternal diabetes.  Also discussed that there is a potential for birth defects or neurologic conditions that can result in elevated amniotic fluid. Potential complications associated with polyhydramnios include malpresentation, increased risk of cord prolapse, increased discomfort during the end of pregnancy, increased risk of preterm contractions and increased risk of stillbirth.  In particular the risk of fetal anomalies and neonatal abnormalities increases with the degree of polyhydramnios.  Approximately 1% of newborns and 5 to 10% of fetuses with mild polyhydramnios will have an abnormality (tracheoesophageal fistula are among the most common abnormalities associated with polyhydramnios).  This increases to 2-5 times a risk with moderate and severe polyhydramnios.  Currently there is no treatment of polyhydramnios if it is idiopathic or due to fetal abnormalities.  If the elevation in fluid is related to maternal diabetes, better glucose control should reduce the degree of polyhydramnios from osmotic diuresis.  Sonographic findings Single intrauterine pregnancy at 33w 5d  Fetal cardiac activity:  Observed and appears normal. Presentation: Cephalic. The anatomic structures that were  well seen  appear normal without evidence of soft markers. Due to poor acoustic windows some structures remain suboptimally visualized. Fetal biometry shows the estimated fetal weight at the 40 percentile.  Amniotic fluid: Polyhydramnios.  MVP: 9.65 cm. Placenta: Anterior Fundal. Adnexa: No abnormality visualized.  There are limitations of prenatal ultrasound such as the inability to detect certain abnormalities due to poor visualization. Various factors such as fetal position, gestational age and maternal body habitus may increase the difficulty in visualizing the fetal anatomy.   Recommendations -EDD is Estimated Date of Delivery: 01/08/24 based on LMP (1 day different from early ultrasound at 9 weeks 3 days) -Serial growth ultrasounds to assess fetal growth and look for potential causes of polyhydramnios and fetus -Antenatal testing is not indicated for isolated moderate polyhydramnios -Delivery timing does not need to be adjusted and delivery at 39 weeks is reasonable -Amniotic fluid reduction is usually reserved for severe probably with extreme maternal discomfort, not indicated at this time  Review of Systems: A review of systems was performed and was negative except per HPI   Vitals and Physical Exam    11/25/2023   10:12 AM 11/25/2023    8:54 AM 11/19/2023    2:50 PM  Vitals with BMI  Weight   191 lbs 11 oz  Systolic 126 145 409  Diastolic 66 73 78  Pulse  81 74    Sitting comfortably on the sonogram table Nonlabored breathing Normal rate and rhythm Abdomen is nontender  Past pregnancies OB History  Gravida Para Term Preterm AB Living  2 1 1  0 0 1  SAB IAB Ectopic Multiple Live Births  0 0 0 0 1    # Outcome Date GA Lbr Len/2nd Weight Sex Type Anes PTL Lv  2 Current           1 Term 11/23/21   7 lb 4.8 oz (3.31 kg) M Vag-Spont   LIV    I spent 45 minutes reviewing the patients chart, including labs and images as well as counseling the patient about her medical  conditions. Greater than 50% of the time was spent in direct face-to-face patient counseling.  Braxton Feathers, DO Maternal fetal medicine, [place]   11/25/2023  12:55 PM

## 2023-11-30 ENCOUNTER — Ambulatory Visit: Attending: Maternal & Fetal Medicine

## 2023-11-30 ENCOUNTER — Encounter: Payer: Self-pay | Admitting: *Deleted

## 2023-11-30 ENCOUNTER — Ambulatory Visit: Admitting: *Deleted

## 2023-11-30 VITALS — BP 131/74 | HR 86

## 2023-11-30 DIAGNOSIS — O99843 Bariatric surgery status complicating pregnancy, third trimester: Secondary | ICD-10-CM | POA: Insufficient documentation

## 2023-11-30 DIAGNOSIS — Z8279 Family history of other congenital malformations, deformations and chromosomal abnormalities: Secondary | ICD-10-CM | POA: Insufficient documentation

## 2023-11-30 DIAGNOSIS — Z3480 Encounter for supervision of other normal pregnancy, unspecified trimester: Secondary | ICD-10-CM | POA: Insufficient documentation

## 2023-11-30 DIAGNOSIS — Z3A34 34 weeks gestation of pregnancy: Secondary | ICD-10-CM | POA: Diagnosis not present

## 2023-11-30 DIAGNOSIS — O403XX0 Polyhydramnios, third trimester, not applicable or unspecified: Secondary | ICD-10-CM | POA: Insufficient documentation

## 2023-11-30 DIAGNOSIS — O99283 Endocrine, nutritional and metabolic diseases complicating pregnancy, third trimester: Secondary | ICD-10-CM | POA: Insufficient documentation

## 2023-11-30 DIAGNOSIS — E282 Polycystic ovarian syndrome: Secondary | ICD-10-CM | POA: Diagnosis not present

## 2023-11-30 DIAGNOSIS — O409XX0 Polyhydramnios, unspecified trimester, not applicable or unspecified: Secondary | ICD-10-CM | POA: Insufficient documentation

## 2023-12-04 ENCOUNTER — Ambulatory Visit: Admitting: Certified Nurse Midwife

## 2023-12-04 ENCOUNTER — Encounter: Payer: Self-pay | Admitting: Certified Nurse Midwife

## 2023-12-04 VITALS — BP 119/74 | HR 74 | Wt 192.9 lb

## 2023-12-04 DIAGNOSIS — O403XX0 Polyhydramnios, third trimester, not applicable or unspecified: Secondary | ICD-10-CM | POA: Diagnosis not present

## 2023-12-04 DIAGNOSIS — Z3480 Encounter for supervision of other normal pregnancy, unspecified trimester: Secondary | ICD-10-CM

## 2023-12-04 DIAGNOSIS — Z3A35 35 weeks gestation of pregnancy: Secondary | ICD-10-CM | POA: Diagnosis not present

## 2023-12-04 DIAGNOSIS — O409XX0 Polyhydramnios, unspecified trimester, not applicable or unspecified: Secondary | ICD-10-CM

## 2023-12-04 NOTE — Assessment & Plan Note (Signed)
 Going to [place] for a hockey game. Reviewed travel precautions. Reviewed kick counts and preterm labor warning signs. Instructed to call office or come to hospital with persistent headache, vision changes, regular contractions, leaking of fluid, decreased fetal movement or vaginal bleeding.

## 2023-12-04 NOTE — Assessment & Plan Note (Signed)
 AFI on 3/20 - 28.36. Has follow up antenatal testing scheduled at [facility]

## 2023-12-04 NOTE — Progress Notes (Signed)
    Return Prenatal Note   Assessment/Plan   Plan  33 y.o. G2P1001 at [redacted]w[redacted]d presents for follow-up OB visit. Reviewed prenatal record including previous visit note.  Polyhydramnios affecting pregnancy AFI on 3/20 - 28.36. Has follow up antenatal testing scheduled at [facility]  Supervision of other normal pregnancy, antepartum Going to [place] for a hockey game. Reviewed travel precautions. Reviewed kick counts and preterm labor warning signs. Instructed to call office or come to hospital with persistent headache, vision changes, regular contractions, leaking of fluid, decreased fetal movement or vaginal bleeding.    No orders of the defined types were placed in this encounter.  No follow-ups on file.   Future Appointments  Date Time Provider Department Center  12/11/2023  3:00 PM [facility] PROVIDER 1 [facility]  12/11/2023  3:30 PM [facility] US5 [facility]  12/18/2023  8:30 AM [facility] NURSE [facility]  12/18/2023  8:45 AM [facility] NST [facility]  12/18/2023  3:55 PM Dominic, Courtney Heys, CNM [facility] None  12/24/2023  8:15 AM [facility] NURSE [facility]  12/24/2023  8:30 AM [facility] US4 [facility]    For next visit:  continue with routine prenatal care     Subjective   33 y.o. G2P1001 at [redacted]w[redacted]d presents for this follow-up prenatal visit.  Patient improved back pain but some pain under the ribs Patient reports: Movement: Present Contractions: Not present  Objective   Flow sheet Vitals: Pulse Rate: 74 BP: 119/74 Fundal Height: 37 cm Fetal Heart Rate (bpm): 140 Total weight gain: 32 lb 14.4 oz (14.9 kg)  General Appearance  No acute distress, well appearing, and well nourished Pulmonary   Normal work of breathing Neurologic   Alert and oriented to person, place, and time Psychiatric   Mood and affect within normal limits  Oley Balm, CNM  03/28/252:03 PM

## 2023-12-06 ENCOUNTER — Other Ambulatory Visit: Payer: Self-pay

## 2023-12-06 ENCOUNTER — Encounter: Payer: Self-pay | Admitting: Obstetrics and Gynecology

## 2023-12-06 ENCOUNTER — Observation Stay
Admission: EM | Admit: 2023-12-06 | Discharge: 2023-12-07 | Disposition: A | Discharge status: Home / Self Care | Attending: Obstetrics and Gynecology | Admitting: Obstetrics and Gynecology

## 2023-12-06 DIAGNOSIS — O98513 Other viral diseases complicating pregnancy, third trimester: Secondary | ICD-10-CM | POA: Diagnosis not present

## 2023-12-06 DIAGNOSIS — O4703 False labor before 37 completed weeks of gestation, third trimester: Secondary | ICD-10-CM | POA: Diagnosis present

## 2023-12-06 DIAGNOSIS — J101 Influenza due to other identified influenza virus with other respiratory manifestations: Secondary | ICD-10-CM | POA: Diagnosis not present

## 2023-12-06 DIAGNOSIS — J45909 Unspecified asthma, uncomplicated: Secondary | ICD-10-CM | POA: Diagnosis not present

## 2023-12-06 DIAGNOSIS — Z1152 Encounter for screening for COVID-19: Secondary | ICD-10-CM | POA: Diagnosis not present

## 2023-12-06 DIAGNOSIS — O479 False labor, unspecified: Principal | ICD-10-CM | POA: Diagnosis present

## 2023-12-06 DIAGNOSIS — Z3A35 35 weeks gestation of pregnancy: Secondary | ICD-10-CM | POA: Diagnosis not present

## 2023-12-06 DIAGNOSIS — O99513 Diseases of the respiratory system complicating pregnancy, third trimester: Secondary | ICD-10-CM | POA: Diagnosis not present

## 2023-12-06 LAB — CBC WITH DIFFERENTIAL/PLATELET
Abs Immature Granulocytes: 0.19 10*3/uL — ABNORMAL HIGH (ref 0.00–0.07)
Basophils Absolute: 0 10*3/uL (ref 0.0–0.1)
Basophils Relative: 0 %
Eosinophils Absolute: 0 10*3/uL (ref 0.0–0.5)
Eosinophils Relative: 0 %
HCT: 35.6 % — ABNORMAL LOW (ref 36.0–46.0)
Hemoglobin: 12.4 g/dL (ref 12.0–15.0)
Immature Granulocytes: 2 %
Lymphocytes Relative: 5 %
Lymphs Abs: 0.6 10*3/uL — ABNORMAL LOW (ref 0.7–4.0)
MCH: 28.2 pg (ref 26.0–34.0)
MCHC: 34.8 g/dL (ref 30.0–36.0)
MCV: 81.1 fL (ref 80.0–100.0)
Monocytes Absolute: 0.7 10*3/uL (ref 0.1–1.0)
Monocytes Relative: 6 %
Neutro Abs: 9.3 10*3/uL — ABNORMAL HIGH (ref 1.7–7.7)
Neutrophils Relative %: 87 %
Platelets: 149 10*3/uL — ABNORMAL LOW (ref 150–400)
RBC: 4.39 MIL/uL (ref 3.87–5.11)
RDW: 14.3 % (ref 11.5–15.5)
WBC: 10.7 10*3/uL — ABNORMAL HIGH (ref 4.0–10.5)
nRBC: 0 % (ref 0.0–0.2)

## 2023-12-06 LAB — URINALYSIS, COMPLETE (UACMP) WITH MICROSCOPIC
Bilirubin Urine: NEGATIVE
Glucose, UA: NEGATIVE mg/dL
Ketones, ur: 20 mg/dL — AB
Nitrite: NEGATIVE
Protein, ur: 30 mg/dL — AB
Specific Gravity, Urine: 1.028 (ref 1.005–1.030)
pH: 5 (ref 5.0–8.0)

## 2023-12-06 LAB — COMPREHENSIVE METABOLIC PANEL WITH GFR
ALT: 14 U/L (ref 0–44)
AST: 24 U/L (ref 15–41)
Albumin: 2.8 g/dL — ABNORMAL LOW (ref 3.5–5.0)
Alkaline Phosphatase: 78 U/L (ref 38–126)
Anion gap: 10 (ref 5–15)
BUN: 9 mg/dL (ref 6–20)
CO2: 17 mmol/L — ABNORMAL LOW (ref 22–32)
Calcium: 8.7 mg/dL — ABNORMAL LOW (ref 8.9–10.3)
Chloride: 106 mmol/L (ref 98–111)
Creatinine, Ser: 0.52 mg/dL (ref 0.44–1.00)
GFR, Estimated: >60 mL/min (ref >60)
Glucose, Bld: 80 mg/dL (ref 70–99)
Potassium: 3.7 mmol/L (ref 3.5–5.1)
Sodium: 133 mmol/L — ABNORMAL LOW (ref 135–145)
Total Bilirubin: 0.5 mg/dL (ref 0.0–1.2)
Total Protein: 6 g/dL — ABNORMAL LOW (ref 6.5–8.1)

## 2023-12-06 MED ORDER — ACETAMINOPHEN 500 MG PO TABS
1000.0000 mg | ORAL_TABLET | Freq: Four times a day (QID) | ORAL | Status: DC | PRN
  Filled 2023-12-06: qty 2

## 2023-12-06 MED ORDER — ACETAMINOPHEN 500 MG PO TABS
500.0000 mg | ORAL_TABLET | Freq: Once | ORAL | Status: AC
  Administered 2023-12-06: 500 mg via ORAL

## 2023-12-06 MED ORDER — LACTATED RINGERS IV BOLUS
1000.0000 mL | Freq: Once | INTRAVENOUS | Status: AC
  Administered 2023-12-06: 1000 mL via INTRAVENOUS

## 2023-12-06 NOTE — OB Triage Note (Signed)
 Patient G2P1 35w is seen at [facility] . Patient today c/o flu like symptoms that started this morning when she woke up, such as body aches, headache, and congestion. Patient also states BP was elevated at home and has had some swelling in her legs as well as blurry vision and a headache. Patient to objective assessment has no swelling in legs. BP WNL on assessment. Patient is febrile 101.1 orally. Provider notified. FHR 165 good variability.

## 2023-12-07 DIAGNOSIS — J111 Influenza due to unidentified influenza virus with other respiratory manifestations: Secondary | ICD-10-CM

## 2023-12-07 DIAGNOSIS — O4703 False labor before 37 completed weeks of gestation, third trimester: Secondary | ICD-10-CM | POA: Diagnosis not present

## 2023-12-07 DIAGNOSIS — Z3A35 35 weeks gestation of pregnancy: Secondary | ICD-10-CM

## 2023-12-07 DIAGNOSIS — O99513 Diseases of the respiratory system complicating pregnancy, third trimester: Secondary | ICD-10-CM | POA: Diagnosis not present

## 2023-12-07 DIAGNOSIS — O479 False labor, unspecified: Principal | ICD-10-CM | POA: Diagnosis present

## 2023-12-07 LAB — RESP PANEL BY RT-PCR (RSV, FLU A&B, COVID)  RVPGX2
Influenza A by PCR: POSITIVE — AB
Influenza B by PCR: NEGATIVE
Resp Syncytial Virus by PCR: NEGATIVE
SARS Coronavirus 2 by RT PCR: NEGATIVE

## 2023-12-07 LAB — PROTEIN / CREATININE RATIO, URINE
Creatinine, Urine: 202 mg/dL
Protein Creatinine Ratio: 0.07 mg/mg{creat} (ref 0.00–0.15)
Total Protein, Urine: 15 mg/dL

## 2023-12-07 MED ORDER — SALINE SPRAY 0.65 % NA SOLN: 1.0000 | NASAL | Status: DC | PRN

## 2023-12-07 MED ORDER — LACTATED RINGERS IV BOLUS
1000.0000 mL | Freq: Once | INTRAVENOUS | Status: AC
  Administered 2023-12-07: 1000 mL via INTRAVENOUS

## 2023-12-07 MED ORDER — SALINE SPRAY 0.65 % NA SOLN
1.0000 | NASAL | Status: DC | PRN
  Administered 2023-12-07: 1 via NASAL
  Filled 2023-12-07: qty 44

## 2023-12-07 MED ORDER — OSELTAMIVIR PHOSPHATE 75 MG PO CAPS: 75.0000 mg | ORAL_CAPSULE | Freq: Two times a day (BID) | ORAL | 0 refills | Status: AC

## 2023-12-07 MED ORDER — OSELTAMIVIR PHOSPHATE 75 MG PO CAPS
75.0000 mg | ORAL_CAPSULE | Freq: Two times a day (BID) | ORAL | Status: DC
  Administered 2023-12-07: 75 mg via ORAL
  Filled 2023-12-07: qty 1

## 2023-12-07 NOTE — Discharge Summary (Signed)
 Patient ID: Wendy Johnston MRN: [id number] DOB/AGE: 05-09-1991 32 y.o.   Admit date: 12/06/2023 Admitting provider: Linzie Collin, MD Discharge date: 12/07/2023     Admission Diagnoses:  1) intrauterine pregnancy at [redacted]w[redacted]d  2) Contractions 3) Body aches, headache   Discharge Diagnoses:  Principal Problem:   Uterine contractions Flu A +  Not in labor   History of Present Illness: The patient is a 33 y.o. female G2P1001 at [redacted]w[redacted]d who presents for uterine contractions that began in the afternoon of 3/31. Wendy Johnston woke up yesterday morning and was "feeling bad" with body aches, chills, and headache and blurry vision. She took several doses of tylenol with no relief. Her contractions began increasing in intensity which is what ultimately brought her into triage. She denies LOF, VB, and endorses good fetal movement.         Past Medical History:  Diagnosis Date   Anemia     Anxiety     Asthma     Depression     PCOS (polycystic ovarian syndrome)     Uterine adenomyoma      JUVENILE CYSTIC               Past Surgical History:  Procedure Laterality Date   GASTRIC ROUX-EN-Y       LAPAROSCOPIC GASTRIC BYPASS   02/24/2017   WISDOM TOOTH EXTRACTION   2015          Medications Ordered Prior to Encounter  No current facility-administered medications on file prior to encounter.          Current Outpatient Medications on File Prior to Encounter  Medication Sig Dispense Refill   albuterol (PROVENTIL) (2.5 MG/3ML) 0.083% nebulizer solution Take 2.5 mg by nebulization every 4 (four) hours as needed for wheezing or shortness of breath.       Albuterol-Budesonide 90-80 MCG/ACT AERO         aspirin EC 81 MG tablet Take 1 tablet (81 mg total) by mouth daily. Take after 12 weeks for prevention of preeclampssia later in pregnancy 300 tablet 2   calcium carbonate (TUMS EX) 750 MG chewable tablet Chew 1 tablet by mouth 3 (three) times daily. Prn       Ferrous Sulfate (IRON PO) Take  2 tablets by mouth daily. (Patient not taking: Reported on 11/25/2023)       Fluticasone-Umeclidin-Vilant (TRELEGY ELLIPTA) 100-62.5-25 MCG/ACT AEPB Take 1 Inhalation by mouth daily.       magnesium oxide (MAG-OX) 400 MG tablet Take 400 mg by mouth daily.       montelukast (SINGULAIR) 10 MG tablet         omeprazole (PRILOSEC) 40 MG capsule Take 40 mg by mouth daily.       Prenatal MV & Min w/FA-DHA (PRENATAL GUMMIES PO) Take by mouth.       sertraline (ZOLOFT) 50 MG tablet Take 1 tablet (50 mg total) by mouth daily. 30 tablet 11        Allergies       Allergies  Allergen Reactions   Nsaids        Patient has history of gastric bypass.          Social History         Socioeconomic History   Marital status: Married      Spouse name: FRANCOIS   Number of children: 1   Years of education: 16   Highest education level: Not on file  Occupational History   Occupation: THIRD  GRADE SCHOOL TEACHER  Tobacco Use   Smoking status: Never   Smokeless tobacco: Never  Vaping Use   Vaping status: Never Used  Substance and Sexual Activity   Alcohol use: No   Drug use: No   Sexual activity: Yes      Partners: Male      Birth control/protection: None  Other Topics Concern   Not on file  Social History Narrative   Not on file    Social Drivers of Health        Financial Resource Strain: Low Risk  (06/01/2023)    Overall Financial Resource Strain (CARDIA)     Difficulty of Paying Living Expenses: Not hard at all  Food Insecurity: No Food Insecurity (06/01/2023)    Hunger Vital Sign     Worried About Running Out of Food in the Last Year: Never true     Ran Out of Food in the Last Year: Never true  Transportation Needs: No Transportation Needs (06/01/2023)    PRAPARE - Therapist, art (Medical): No     Lack of Transportation (Non-Medical): No  Physical Activity: Sufficiently Active (06/01/2023)    Exercise Vital Sign     Days of Exercise per Week: 3 days      Minutes of Exercise per Session: 60 min  Stress: No Stress Concern Present (06/01/2023)    [employer] of Occupational Health - Occupational Stress Questionnaire     Feeling of Stress : Not at all  Social Connections: Moderately Integrated (06/01/2023)    Social Connection and Isolation Panel [NHANES]     Frequency of Communication with Friends and Family: More than three times a week     Frequency of Social Gatherings with Friends and Family: Twice a week     Attends Religious Services: More than 4 times per year     Active Member of [employer] or Organizations: No     Attends Banker Meetings: Never     Marital Status: Married  Catering manager Violence: Not At Risk (06/01/2023)    Humiliation, Afraid, Rape, and Kick questionnaire     Fear of Current or Ex-Partner: No     Emotionally Abused: No     Physically Abused: No     Sexually Abused: No           Family History  Problem Relation Age of Onset   Cervical cancer Mother 30        total hysterectomy   Cancer Father 53        pancreatic   Healthy Brother     Diabetes Maternal Grandfather     Healthy Maternal Grandmother     Heart attack Paternal Grandmother     Heart attack Paternal Grandfather            Review of Systems  Constitutional:  Positive for chills and fever.  HENT:  Positive for congestion.   Eyes:  Positive for blurred vision.  Musculoskeletal:  Positive for myalgias.  All other systems reviewed and are negative.     Physical Exam: BP 130/72   Pulse (!) 121   Temp 99.7 F (37.6 C) (Axillary)   LMP 04/03/2023 (Exact Date)   Physical Exam Constitutional:      Appearance: Normal appearance.  Genitourinary:     Vulva normal.  HENT:     Head: Normocephalic and atraumatic.     Nose: Congestion present.  Cardiovascular:     Rate and Rhythm: Regular  rhythm. Tachycardia present.     Heart sounds: Normal heart sounds.     Comments: Tachycardia on admission- now resolved Pulmonary:      Effort: Pulmonary effort is normal.     Breath sounds: Normal breath sounds.  Abdominal:     Palpations: Abdomen is soft.     Comments: Gravid  Neurological:     Mental Status: She is alert and oriented to person, place, and time.  Skin:    General: Skin is warm and dry.  Psychiatric:        Mood and Affect: Mood normal.        Behavior: Behavior normal.      Consults: None   Significant Findings/ Diagnostic Studies: labs: Resp Panel Flu A+, CBC, CMP, UP/C, and UA benign.    Procedures: NST- reactive   Hospital Course: The patient was admitted to Labor and Delivery Triage for observation. She was placed on EFM which originally showed fetal and maternal tachycardia- now resolved s/p 2L of IVF and 500 mg tylenol. Wendy Johnston was febrile on admission (38.4) but is now afebrile (99.7). Labs were sent including CBC, CMP, respiratory panel, UA, and UP/C. Her respiratory panel came back positive for Flu A. We discussed treatment with tamiflu, and Wendy Johnston verbalizes ok with this plan. She reports she is "feeling better" at this time. Cervical exam performed in light of contractions, which was 0.5/thick/ballotable. Given this improvement and resolution of fetal tachycardia and fever, we discussed plan for discharge home. Return precautions given, and Wendy Johnston verbalizes understanding.    Discharge Condition: stable   Disposition: Discharge disposition: 01-Home or Self Care             Diet: Regular diet   Discharge Activity: Activity as tolerated     Allergies as of 12/07/2023         Reactions    Nsaids      Patient has history of gastric bypass.              Medication List       TAKE these medications     albuterol (2.5 MG/3ML) 0.083% nebulizer solution Commonly known as: PROVENTIL Take 2.5 mg by nebulization every 4 (four) hours as needed for wheezing or shortness of breath.    Albuterol-Budesonide 90-80 MCG/ACT Aero    aspirin EC 81 MG tablet Take 1 tablet (81 mg total)  by mouth daily. Take after 12 weeks for prevention of preeclampssia later in pregnancy    calcium carbonate 750 MG chewable tablet Commonly known as: TUMS EX Chew 1 tablet by mouth 3 (three) times daily. Prn    IRON PO Take 2 tablets by mouth daily.    magnesium oxide 400 MG tablet Commonly known as: MAG-OX Take 400 mg by mouth daily.    montelukast 10 MG tablet Commonly known as: SINGULAIR    omeprazole 40 MG capsule Commonly known as: PRILOSEC Take 40 mg by mouth daily.    oseltamivir 75 MG capsule Commonly known as: TAMIFLU Take 1 capsule (75 mg total) by mouth 2 (two) times daily for 5 days.    PRENATAL GUMMIES PO Take by mouth.    sertraline 50 MG tablet Commonly known as: ZOLOFT Take 1 tablet (50 mg total) by mouth daily.    sodium chloride 0.65 % Soln nasal spray Commonly known as: OCEAN Place 1 spray into both nostrils as needed for congestion.    Trelegy Ellipta 100-62.5-25 MCG/ACT Aepb Generic drug: Fluticasone-Umeclidin-Vilant Take 1 Inhalation by mouth daily.  Total time spent taking care of this patient: 30 minutes   Signed: Cindra Eves, SNM Ellouise Newer [facility], CNM  12/07/2023, 2:05 AM

## 2023-12-07 NOTE — Final Progress Note (Signed)
 Physician Final Progress Note  Patient ID: Wendy Johnston MRN: [id number] DOB/AGE: [birth year] 33 y.o.  Admit date: 12/06/2023 Admitting provider: Linzie Collin, MD Discharge date: 12/07/2023   Admission Diagnoses:  1) intrauterine pregnancy at [redacted]w[redacted]d  2) Contractions 3) Body aches, headache  Discharge Diagnoses:  Principal Problem:   Uterine contractions Flu A +  Not in labor  History of Present Illness: The patient is a 33 y.o. female G2P1001 at [redacted]w[redacted]d who presents for uterine contractions that began in the afternoon of 3/31. Wendy Johnston woke up yesterday morning and was "feeling bad" with body aches, chills, and headache and blurry vision. She took several doses of tylenol with no relief. Her contractions began increasing in intensity which is what ultimately brought her into triage. She denies LOF, VB, and endorses good fetal movement.    Past Medical History:  Diagnosis Date   Anemia    Anxiety    Asthma    Depression    PCOS (polycystic ovarian syndrome)    Uterine adenomyoma    JUVENILE CYSTIC    Past Surgical History:  Procedure Laterality Date   GASTRIC ROUX-EN-Y     LAPAROSCOPIC GASTRIC BYPASS  02/24/2017   WISDOM TOOTH EXTRACTION  2015    No current facility-administered medications on file prior to encounter.   Current Outpatient Medications on File Prior to Encounter  Medication Sig Dispense Refill   albuterol (PROVENTIL) (2.5 MG/3ML) 0.083% nebulizer solution Take 2.5 mg by nebulization every 4 (four) hours as needed for wheezing or shortness of breath.     Albuterol-Budesonide 90-80 MCG/ACT AERO      aspirin EC 81 MG tablet Take 1 tablet (81 mg total) by mouth daily. Take after 12 weeks for prevention of preeclampssia later in pregnancy 300 tablet 2   calcium carbonate (TUMS EX) 750 MG chewable tablet Chew 1 tablet by mouth 3 (three) times daily. Prn     Ferrous Sulfate (IRON PO) Take 2 tablets by mouth daily. (Patient not taking: Reported on  11/25/2023)     Fluticasone-Umeclidin-Vilant (TRELEGY ELLIPTA) 100-62.5-25 MCG/ACT AEPB Take 1 Inhalation by mouth daily.     magnesium oxide (MAG-OX) 400 MG tablet Take 400 mg by mouth daily.     montelukast (SINGULAIR) 10 MG tablet      omeprazole (PRILOSEC) 40 MG capsule Take 40 mg by mouth daily.     Prenatal MV & Min w/FA-DHA (PRENATAL GUMMIES PO) Take by mouth.     sertraline (ZOLOFT) 50 MG tablet Take 1 tablet (50 mg total) by mouth daily. 30 tablet 11    Allergies  Allergen Reactions   Nsaids     Patient has history of gastric bypass.      Social History   Socioeconomic History   Marital status: Married    Spouse name: FRANCOIS   Number of children: 1   Years of education: 16   Highest education level: Not on file  Occupational History   Occupation: THIRD GRADE SCHOOL TEACHER  Tobacco Use   Smoking status: Never   Smokeless tobacco: Never  Vaping Use   Vaping status: Never Used  Substance and Sexual Activity   Alcohol use: No   Drug use: No   Sexual activity: Yes    Partners: Male    Birth control/protection: None  Other Topics Concern   Not on file  Social History Narrative   Not on file   Social Drivers of Health   Financial Resource Strain: Low Risk  (06/01/2023)   Overall  Financial Resource Strain (CARDIA)    Difficulty of Paying Living Expenses: Not hard at all  Food Insecurity: No Food Insecurity (06/01/2023)   Hunger Vital Sign    Worried About Running Out of Food in the Last Year: Never true    Ran Out of Food in the Last Year: Never true  Transportation Needs: No Transportation Needs (06/01/2023)   PRAPARE - Administrator, Civil Service (Medical): No    Lack of Transportation (Non-Medical): No  Physical Activity: Sufficiently Active (06/01/2023)   Exercise Vital Sign    Days of Exercise per Week: 3 days    Minutes of Exercise per Session: 60 min  Stress: No Stress Concern Present (06/01/2023)   [employer] of Occupational Health  - Occupational Stress Questionnaire    Feeling of Stress : Not at all  Social Connections: Moderately Integrated (06/01/2023)   Social Connection and Isolation Panel [NHANES]    Frequency of Communication with Friends and Family: More than three times a week    Frequency of Social Gatherings with Friends and Family: Twice a week    Attends Religious Services: More than 4 times per year    Active Member of [employer] or Organizations: No    Attends Banker Meetings: Never    Marital Status: Married  Catering manager Violence: Not At Risk (06/01/2023)   Humiliation, Afraid, Rape, and Kick questionnaire    Fear of Current or Ex-Partner: No    Emotionally Abused: No    Physically Abused: No    Sexually Abused: No    Family History  Problem Relation Age of Onset   Cervical cancer Mother 30       total hysterectomy   Cancer Father 39       pancreatic   Healthy Brother    Diabetes Maternal Grandfather    Healthy Maternal Grandmother    Heart attack Paternal Grandmother    Heart attack Paternal Grandfather      Review of Systems  Constitutional:  Positive for chills and fever.  HENT:  Positive for congestion.   Eyes:  Positive for blurred vision.  Musculoskeletal:  Positive for myalgias.  All other systems reviewed and are negative.    Physical Exam: BP 130/72   Pulse (!) 121   Temp 99.7 F (37.6 C) (Axillary)   LMP 04/03/2023 (Exact Date)   Physical Exam Constitutional:      Appearance: Normal appearance.  Genitourinary:     Vulva normal.  HENT:     Head: Normocephalic and atraumatic.     Nose: Congestion present.  Cardiovascular:     Rate and Rhythm: Regular rhythm. Tachycardia present.     Heart sounds: Normal heart sounds.     Comments: Tachycardia on admission- now resolved Pulmonary:     Effort: Pulmonary effort is normal.     Breath sounds: Normal breath sounds.  Abdominal:     Palpations: Abdomen is soft.     Comments: Gravid  Neurological:      Mental Status: She is alert and oriented to person, place, and time.  Skin:    General: Skin is warm and dry.  Psychiatric:        Mood and Affect: Mood normal.        Behavior: Behavior normal.    Consults: None  Significant Findings/ Diagnostic Studies: labs: Resp Panel Flu A+, CBC, CMP, UP/C, and UA benign.   Procedures: NST- reactive  Hospital Course: The patient was admitted to Labor and  Delivery Triage for observation. She was placed on EFM which originally showed fetal and maternal tachycardia- now resolved s/p 2L of IVF and 500 mg tylenol. Wendy Johnston was febrile on admission (38.4) but is now afebrile (99.7). Labs were sent including CBC, CMP, respiratory panel, UA, and UP/C. Her respiratory panel came back positive for Flu A. We discussed treatment with tamiflu, and Wendy Johnston verbalizes ok with this plan. She reports she is "feeling better" at this time. Cervical exam performed in light of contractions, which was 0.5/thick/ballotable. Given this improvement and resolution of fetal tachycardia and fever, we discussed plan for discharge home. Return precautions given, and Wendy Johnston verbalizes understanding.   Discharge Condition: stable  Disposition: Discharge disposition: 01-Home or Self Care       Diet: Regular diet  Discharge Activity: Activity as tolerated   Allergies as of 12/07/2023       Reactions   Nsaids    Patient has history of gastric bypass.          Medication List     TAKE these medications    albuterol (2.5 MG/3ML) 0.083% nebulizer solution Commonly known as: PROVENTIL Take 2.5 mg by nebulization every 4 (four) hours as needed for wheezing or shortness of breath.   Albuterol-Budesonide 90-80 MCG/ACT Aero   aspirin EC 81 MG tablet Take 1 tablet (81 mg total) by mouth daily. Take after 12 weeks for prevention of preeclampssia later in pregnancy   calcium carbonate 750 MG chewable tablet Commonly known as: TUMS EX Chew 1 tablet by mouth 3 (three)  times daily. Prn   IRON PO Take 2 tablets by mouth daily.   magnesium oxide 400 MG tablet Commonly known as: MAG-OX Take 400 mg by mouth daily.   montelukast 10 MG tablet Commonly known as: SINGULAIR   omeprazole 40 MG capsule Commonly known as: PRILOSEC Take 40 mg by mouth daily.   oseltamivir 75 MG capsule Commonly known as: TAMIFLU Take 1 capsule (75 mg total) by mouth 2 (two) times daily for 5 days.   PRENATAL GUMMIES PO Take by mouth.   sertraline 50 MG tablet Commonly known as: ZOLOFT Take 1 tablet (50 mg total) by mouth daily.   sodium chloride 0.65 % Soln nasal spray Commonly known as: OCEAN Place 1 spray into both nostrils as needed for congestion.   Trelegy Ellipta 100-62.5-25 MCG/ACT Aepb Generic drug: Fluticasone-Umeclidin-Vilant Take 1 Inhalation by mouth daily.         Total time spent taking care of this patient: 30 minutes  Signed: Cindra Eves, SNM Ellouise Newer [facility], CNM  12/07/2023, 2:05 AM

## 2023-12-07 NOTE — OB Triage Note (Signed)
 Patient discharged with paperwork and education on understanding preterm labor precautions. Patient given take home nasal spray, pt knows follow up appointments. Pt wheeled down to lobby

## 2023-12-11 ENCOUNTER — Ambulatory Visit

## 2023-12-11 ENCOUNTER — Other Ambulatory Visit

## 2023-12-11 ENCOUNTER — Ambulatory Visit: Attending: Nurse Practitioner

## 2023-12-18 ENCOUNTER — Observation Stay
Admission: EM | Admit: 2023-12-18 | Discharge: 2023-12-18 | Disposition: A | Discharge status: Home / Self Care | Admitting: Obstetrics and Gynecology

## 2023-12-18 ENCOUNTER — Ambulatory Visit

## 2023-12-18 ENCOUNTER — Ambulatory Visit (HOSPITAL_BASED_OUTPATIENT_CLINIC_OR_DEPARTMENT_OTHER)

## 2023-12-18 ENCOUNTER — Other Ambulatory Visit: Payer: Self-pay

## 2023-12-18 ENCOUNTER — Ambulatory Visit: Admitting: Licensed Practical Nurse

## 2023-12-18 ENCOUNTER — Encounter: Payer: Self-pay | Admitting: Obstetrics and Gynecology

## 2023-12-18 ENCOUNTER — Other Ambulatory Visit
Admission: RE | Admit: 2023-12-18 | Discharge: 2023-12-18 | Disposition: A | Discharge status: Home / Self Care | Source: Ambulatory Visit | Attending: Obstetrics and Gynecology | Admitting: Obstetrics and Gynecology

## 2023-12-18 ENCOUNTER — Ambulatory Visit: Admitting: Obstetrics and Gynecology

## 2023-12-18 VITALS — BP 140/78 | HR 73 | Wt 193.1 lb

## 2023-12-18 VITALS — BP 146/76 | HR 80

## 2023-12-18 DIAGNOSIS — E669 Obesity, unspecified: Secondary | ICD-10-CM

## 2023-12-18 DIAGNOSIS — R030 Elevated blood-pressure reading, without diagnosis of hypertension: Secondary | ICD-10-CM | POA: Diagnosis present

## 2023-12-18 DIAGNOSIS — O99843 Bariatric surgery status complicating pregnancy, third trimester: Secondary | ICD-10-CM | POA: Diagnosis not present

## 2023-12-18 DIAGNOSIS — O133 Gestational [pregnancy-induced] hypertension without significant proteinuria, third trimester: Secondary | ICD-10-CM

## 2023-12-18 DIAGNOSIS — Z113 Encounter for screening for infections with a predominantly sexual mode of transmission: Secondary | ICD-10-CM | POA: Insufficient documentation

## 2023-12-18 DIAGNOSIS — Z3A37 37 weeks gestation of pregnancy: Secondary | ICD-10-CM

## 2023-12-18 DIAGNOSIS — O99840 Bariatric surgery status complicating pregnancy, unspecified trimester: Secondary | ICD-10-CM

## 2023-12-18 DIAGNOSIS — Z3403 Encounter for supervision of normal first pregnancy, third trimester: Secondary | ICD-10-CM

## 2023-12-18 DIAGNOSIS — O99213 Obesity complicating pregnancy, third trimester: Secondary | ICD-10-CM

## 2023-12-18 DIAGNOSIS — O409XX0 Polyhydramnios, unspecified trimester, not applicable or unspecified: Secondary | ICD-10-CM

## 2023-12-18 DIAGNOSIS — O403XX0 Polyhydramnios, third trimester, not applicable or unspecified: Secondary | ICD-10-CM

## 2023-12-18 DIAGNOSIS — O139 Gestational [pregnancy-induced] hypertension without significant proteinuria, unspecified trimester: Secondary | ICD-10-CM | POA: Insufficient documentation

## 2023-12-18 DIAGNOSIS — Z3480 Encounter for supervision of other normal pregnancy, unspecified trimester: Principal | ICD-10-CM

## 2023-12-18 DIAGNOSIS — Z3685 Encounter for antenatal screening for Streptococcus B: Secondary | ICD-10-CM

## 2023-12-18 LAB — COMPREHENSIVE METABOLIC PANEL WITH GFR
ALT: 12 U/L (ref 0–44)
AST: 23 U/L (ref 15–41)
Albumin: 2.8 g/dL — ABNORMAL LOW (ref 3.5–5.0)
Alkaline Phosphatase: 93 U/L (ref 38–126)
Anion gap: 7 (ref 5–15)
BUN: 12 mg/dL (ref 6–20)
CO2: 22 mmol/L (ref 22–32)
Calcium: 8.7 mg/dL — ABNORMAL LOW (ref 8.9–10.3)
Chloride: 106 mmol/L (ref 98–111)
Creatinine, Ser: 0.89 mg/dL (ref 0.44–1.00)
GFR, Estimated: >60 mL/min (ref >60)
Glucose, Bld: 86 mg/dL (ref 70–99)
Potassium: 4.1 mmol/L (ref 3.5–5.1)
Sodium: 135 mmol/L (ref 135–145)
Total Bilirubin: 0.5 mg/dL (ref 0.0–1.2)
Total Protein: 6.6 g/dL (ref 6.5–8.1)

## 2023-12-18 LAB — PROTEIN / CREATININE RATIO, URINE
Creatinine, Urine: 63 mg/dL
Total Protein, Urine: <6 mg/dL

## 2023-12-18 LAB — CBC
HCT: 36.7 % (ref 36.0–46.0)
Hemoglobin: 12.5 g/dL (ref 12.0–15.0)
MCH: 27.8 pg (ref 26.0–34.0)
MCHC: 34.1 g/dL (ref 30.0–36.0)
MCV: 81.7 fL (ref 80.0–100.0)
Platelets: 275 10*3/uL (ref 150–400)
RBC: 4.49 MIL/uL (ref 3.87–5.11)
RDW: 14 % (ref 11.5–15.5)
WBC: 10.1 10*3/uL (ref 4.0–10.5)
nRBC: 0 % (ref 0.0–0.2)

## 2023-12-18 NOTE — Progress Notes (Signed)
    Return Prenatal Note   Subjective   33 y.o. G2P1001 at [redacted]w[redacted]d presents for this follow-up prenatal visit.  Patient Doing ok, recovering from the Flu-has lingering nasal congestion. BP elevated today, denies HA or Visual disturbances, has had pain on her ribs for a few weeks.  Patient reports: Movement: Present Contractions: Not present  Objective   Flow sheet Vitals: Pulse Rate: 73 BP: (!) 140/78 Fundal Height: 40 cm Fetal Heart Rate (bpm): 128 Total weight gain: 33 lb 1.6 oz (15 kg)  General Appearance  No acute distress, well appearing, and well nourished Pulmonary   Normal work of breathing Neurologic   Alert and oriented to person, place, and time Psychiatric   Mood and affect within normal limits  Assessment/Plan   Plan  33 y.o. G2P1001 at [redacted]w[redacted]d presents for follow-up OB visit. Reviewed prenatal record including previous visit note.  Gestational hypertension -Has had an elevated BP on 3/17, now meets criteria for GHTN  -Pt reports home Blood pressures are in the 140's -Instructed to go to L and D for PIH work up   Supervision of other normal pregnancy, antepartum -36wk labs collected   Polyhydramnios affecting pregnancy -RNST today at MFM  -AFI: 28.36 on 3/24 -Next [place] 4/17      Orders Placed This Encounter  Procedures   Culture, beta strep (group b only)   No follow-ups on file.   Future Appointments  Date Time Provider Department Center  12/24/2023  8:00 AM [facility] PROVIDER 1 [facility]  12/24/2023  8:30 AM [facility] US4 [facility]  12/24/2023  3:15 PM [place], Irving Burton, CNM [facility] None    For next visit:  will make plan once evaluated in triage      Ellouise Newer [facility], CNM  04/11/254:57 PM

## 2023-12-18 NOTE — Addendum Note (Signed)
 Addended by: Noralee Space on: 12/18/2023 05:52 PM   Modules accepted: Level of Service

## 2023-12-18 NOTE — OB Triage Note (Signed)
 Discharge home. Left floor ambulatory by herself. IOL scheduled for GHTN. Elaina Hoops

## 2023-12-18 NOTE — Assessment & Plan Note (Signed)
-  36wk labs collected

## 2023-12-18 NOTE — OB Triage Note (Signed)
 Sent over from [facility] office for PIH work up. Elaina Hoops

## 2023-12-18 NOTE — Assessment & Plan Note (Addendum)
-  Has had an elevated BP on 3/17, now meets criteria for GHTN  -Pt reports home Blood pressures are in the 140's -Instructed to go to L and D for PIH work up

## 2023-12-18 NOTE — OB Triage Note (Signed)
 LABOR & DELIVERY OB TRIAGE NOTE  SUBJECTIVE  HPI Sashay Felling is a 33 y.o. G2P1001 at [redacted]w[redacted]d who presents to Labor & Delivery for evaluation of elevated blood pressures. Her pregnancy is otherwise complicated by anemia, hx of gastric bypass surgery, elevated BMI, and polyhydramnios.  She was seen in clinic today for a routine ROB and found to have mild range blood pressures so was sent to [facility] triage for further evaluation.   She denies HA, vision changes, RUQ pain, contractions, LOF, vaginal bleeding. She endorses normal fetal movement.     OB History     Gravida  2   Para  1   Term  1   Preterm  0   AB  0   Living  1      SAB  0   IAB  0   Ectopic  0   Multiple  0   Live Births  1           OBJECTIVE  BP 129/79   Pulse (!) 58   Ht 5\' 1"  (1.549 m)   Wt 87.6 kg   LMP 04/03/2023 (Exact Date)   BMI 36.49 kg/m   General:A&Ox4 Heart: normal heart rate, no signs of edema Lungs: normal work of breathing Abdomen: gravid, soft, non-tender Cervical exam:   deferred  NST I reviewed the NST and it was reactive.  Baseline: 125 bpm Variability: moderate Accelerations: greater than 2 15x15 Decelerations:none Toco: irregular contractions Category I  ASSESSMENT Impression  1) Pregnancy at G2P1001, [redacted]w[redacted]d, Estimated Date of Delivery: 01/08/24 2) Reassuring maternal/fetal status 3) Meets criteria for diagnosis of gHTN with two mild range blood pressures separated by more than 4 hours. Bps initially mild range in triage, then normotensive with continuous cycling.  4) PIH labs WNL, no preeclampsia at this time.   PLAN 1) Reviewed patient with Dr. Valentino Saxon who recommends induction for gHTN in one week. Induction scheduled for 4/19 at 0800.  2) Message sent to clinic to schedule ROB with NST on 4/14. Needs GBS testing which was not noted while she was in triage, will plan to collect at office visit on 4/14. 3) Patient discharged in stable condition with  preeclampsia precautions.    Lindalou Hose [place], CNM  12/18/23  9:05 PM

## 2023-12-18 NOTE — Progress Notes (Signed)
 Consult note in NST report.

## 2023-12-18 NOTE — Assessment & Plan Note (Deleted)
-  RNST today at MFM  -Next [place] 4/17

## 2023-12-18 NOTE — Procedures (Addendum)
 Wendy Johnston Mar 06, 1991 [redacted]w[redacted]d  Fetus A Non-Stress Test Interpretation for 12/18/23  Indication: Polyhydramnios and Obesity  Fetal Heart Rate A Mode: External Baseline Rate (A): 145 bpm Variability: Moderate Accelerations: 15 x 15 Decelerations: None Multiple birth?: No  Uterine Activity Mode: Palpation, Toco Contraction Frequency (min): None Resting Tone Palpated: Relaxed  Interpretation (Fetal Testing) Nonstress Test Interpretation: Reactive Comments: Reviewed with Dr Judeth Cornfield  MFM Note Modified BPP Patient is here for NST monitoring. NST is reactive. I performed a bedside ultrasound. Mild polyhydramnios is seen. Good fetal heart activity is seen. AFI is 26 cm.  Cephalic presentation. Patient reports good fetal movements. I reassured the patient of the findings. Reassuring Modified BPP is associated with a very low likelihood of stillbirth (0.8 per 1,000 births in one week). I explained the finding of polyhydramnios.  She is cephalic presentation that is well-placed in the lower segment.  I encouraged her to come to the hospital if she has rupture of membranes (leakage of amniotic fluid).  Cord prolapse is unlikely with mild polyhydramnios and cephalic presentation (not free-floating). I recommend delivery at 39 weeks' gestation. Consultation including face-to-face (more than 50%) counseling 10 minutes.

## 2023-12-18 NOTE — Assessment & Plan Note (Addendum)
-  RNST today at MFM  -AFI: 28.36 on 3/24 -Next [place] 4/17

## 2023-12-21 ENCOUNTER — Ambulatory Visit (INDEPENDENT_AMBULATORY_CARE_PROVIDER_SITE_OTHER): Admitting: Obstetrics

## 2023-12-21 ENCOUNTER — Other Ambulatory Visit

## 2023-12-21 ENCOUNTER — Other Ambulatory Visit (HOSPITAL_COMMUNITY)
Admission: RE | Admit: 2023-12-21 | Discharge: 2023-12-21 | Disposition: A | Discharge status: Home / Self Care | Source: Ambulatory Visit | Attending: Obstetrics | Admitting: Obstetrics

## 2023-12-21 ENCOUNTER — Encounter: Payer: Self-pay | Admitting: Obstetrics

## 2023-12-21 VITALS — BP 139/84 | HR 76 | Wt 195.0 lb

## 2023-12-21 DIAGNOSIS — O133 Gestational [pregnancy-induced] hypertension without significant proteinuria, third trimester: Secondary | ICD-10-CM | POA: Diagnosis present

## 2023-12-21 DIAGNOSIS — Z3685 Encounter for antenatal screening for Streptococcus B: Secondary | ICD-10-CM

## 2023-12-21 DIAGNOSIS — Z3A37 37 weeks gestation of pregnancy: Secondary | ICD-10-CM | POA: Insufficient documentation

## 2023-12-21 DIAGNOSIS — Z3403 Encounter for supervision of normal first pregnancy, third trimester: Secondary | ICD-10-CM | POA: Diagnosis present

## 2023-12-21 DIAGNOSIS — Z113 Encounter for screening for infections with a predominantly sexual mode of transmission: Secondary | ICD-10-CM

## 2023-12-21 DIAGNOSIS — O403XX0 Polyhydramnios, third trimester, not applicable or unspecified: Secondary | ICD-10-CM | POA: Insufficient documentation

## 2023-12-21 NOTE — Addendum Note (Signed)
 Addended by: Abram Abraham on: 12/21/2023 03:59 PM   Modules accepted: Orders

## 2023-12-21 NOTE — Assessment & Plan Note (Addendum)
-   Anticipatory guidance reviewed  - Discussed membrane sweep, if dilated, at next appointment before IOL - GBS recollected  -Reviewed labor warning signs. Instructed to call office or come to hospital with persistent headache, vision changes, regular contractions, leaking of fluid, decreased fetal movement or vaginal bleeding.

## 2023-12-21 NOTE — Assessment & Plan Note (Addendum)
-   NST today  - US  4/17

## 2023-12-21 NOTE — Addendum Note (Signed)
 Addended by: Abram Abraham on: 12/21/2023 03:19 PM   Modules accepted: Orders

## 2023-12-21 NOTE — Progress Notes (Cosign Needed)
    Return Prenatal Note   Assessment/Plan   Plan  33 y.o. G2P1001 at [redacted]w[redacted]d presents for follow-up OB visit. Reviewed prenatal record including previous visit note.  Encounter for supervision of normal first pregnancy in third trimester - Anticipatory guidance reviewed  - Discussed membrane sweep, if dilated, at next appointment before IOL - GBS recollected  -Reviewed labor warning signs. Instructed to call office or come to hospital with persistent headache, vision changes, regular contractions, leaking of fluid, decreased fetal movement or vaginal bleeding.    Polyhydramnios affecting pregnancy in third trimester - NST today  - US  4/17   No orders of the defined types were placed in this encounter.  No follow-ups on file.   Future Appointments  Date Time Provider Department Center  12/24/2023  8:00 AM [facility] PROVIDER 1 [facility]  12/24/2023  8:30 AM [facility] US4 [facility]  12/24/2023  3:15 PM [place], Sherline Distel, CNM [facility] None    For next visit:  Routine prenatal care    Subjective  Wendy Johnston is feeling well overall, she has had normal to mild range BP's at home, no severe range BP's. She is feeling good about her induction on Saturday. Denies HA, visual changes, epigastric pain. Denies LOF, VB.   Movement: Present Contractions: Not present  Objective   Flow sheet Vitals: Pulse Rate: 76 BP: 139/84 Fundal Height: 39 cm Total weight gain: 15.9 kg  Wendy Johnston 1990-09-24 [redacted]w[redacted]d  Non-Stress Test Interpretation for 12/21/23  Indication: GHTTN, Polyhrdramnios   Fetal Heart Rate A Mode: External Baseline Rate (A): 125 bpm Variability: Moderate Accelerations: 15 x 15 Decelerations: None Scalp Stimulation: Negative Multiple birth?: No  Uterine Activity Mode: Toco Contraction Frequency (min): none  Interpretation (Fetal Testing) Nonstress Test Interpretation: Reactive Overall Impression: Non-reassuring    General Appearance  No acute  distress, well appearing, and well nourished Pulmonary   Normal work of breathing Neurologic   Alert and oriented to person, place, and time Psychiatric   Mood and affect within normal limits  Rosealee Concha, [facility]  12/21/23 2:12 PM

## 2023-12-21 NOTE — Patient Instructions (Signed)

## 2023-12-22 LAB — CERVICOVAGINAL ANCILLARY ONLY
Chlamydia: NEGATIVE
Comment: NEGATIVE
Comment: NORMAL
Neisseria Gonorrhea: NEGATIVE

## 2023-12-23 LAB — CERVICOVAGINAL ANCILLARY ONLY
Chlamydia: NEGATIVE
Comment: NEGATIVE
Comment: NORMAL
Neisseria Gonorrhea: NEGATIVE

## 2023-12-23 LAB — STREP GP B NAA: Strep Gp B NAA: NEGATIVE

## 2023-12-24 ENCOUNTER — Ambulatory Visit

## 2023-12-24 ENCOUNTER — Ambulatory Visit (INDEPENDENT_AMBULATORY_CARE_PROVIDER_SITE_OTHER): Admitting: Certified Nurse Midwife

## 2023-12-24 ENCOUNTER — Ambulatory Visit (HOSPITAL_BASED_OUTPATIENT_CLINIC_OR_DEPARTMENT_OTHER): Admitting: Obstetrics and Gynecology

## 2023-12-24 ENCOUNTER — Other Ambulatory Visit: Payer: Self-pay | Admitting: Maternal & Fetal Medicine

## 2023-12-24 ENCOUNTER — Ambulatory Visit: Attending: Maternal & Fetal Medicine

## 2023-12-24 ENCOUNTER — Encounter: Payer: Self-pay | Admitting: Certified Nurse Midwife

## 2023-12-24 ENCOUNTER — Ambulatory Visit: Admitting: *Deleted

## 2023-12-24 VITALS — BP 144/89 | HR 70 | Wt 193.6 lb

## 2023-12-24 VITALS — BP 123/66 | HR 86

## 2023-12-24 DIAGNOSIS — O133 Gestational [pregnancy-induced] hypertension without significant proteinuria, third trimester: Secondary | ICD-10-CM | POA: Insufficient documentation

## 2023-12-24 DIAGNOSIS — Z3A37 37 weeks gestation of pregnancy: Secondary | ICD-10-CM | POA: Insufficient documentation

## 2023-12-24 DIAGNOSIS — Z8279 Family history of other congenital malformations, deformations and chromosomal abnormalities: Secondary | ICD-10-CM | POA: Insufficient documentation

## 2023-12-24 DIAGNOSIS — O409XX0 Polyhydramnios, unspecified trimester, not applicable or unspecified: Secondary | ICD-10-CM | POA: Insufficient documentation

## 2023-12-24 DIAGNOSIS — O99213 Obesity complicating pregnancy, third trimester: Secondary | ICD-10-CM | POA: Insufficient documentation

## 2023-12-24 DIAGNOSIS — Z362 Encounter for other antenatal screening follow-up: Secondary | ICD-10-CM | POA: Insufficient documentation

## 2023-12-24 DIAGNOSIS — Z3403 Encounter for supervision of normal first pregnancy, third trimester: Secondary | ICD-10-CM

## 2023-12-24 DIAGNOSIS — O403XX0 Polyhydramnios, third trimester, not applicable or unspecified: Secondary | ICD-10-CM | POA: Insufficient documentation

## 2023-12-24 DIAGNOSIS — O99843 Bariatric surgery status complicating pregnancy, third trimester: Secondary | ICD-10-CM | POA: Diagnosis not present

## 2023-12-24 LAB — POCT URINALYSIS DIPSTICK OB
Bilirubin, UA: NEGATIVE
Glucose, UA: NEGATIVE
Ketones, UA: NEGATIVE
Leukocytes, UA: NEGATIVE
Nitrite, UA: NEGATIVE
POC,PROTEIN,UA: NEGATIVE
Spec Grav, UA: 1.01 (ref 1.010–1.025)
Urobilinogen, UA: 0.2 U/dL
pH, UA: 6 (ref 5.0–8.0)

## 2023-12-24 NOTE — Progress Notes (Signed)
 After review, MFM consult with provider is not indicated for today  Cassandria Clever, MD 12/24/2023 5:59 PM  [facility]

## 2023-12-24 NOTE — Progress Notes (Signed)
    Return Prenatal Note   Assessment/Plan   Plan  33 y.o. G2P1001 at [redacted]w[redacted]d presents for follow-up OB visit. Reviewed prenatal record including previous visit note.  Gestational hypertension Elevated BP today. UPC sent. No symptoms of pre-e. Has IOL scheduled for Saturday  Polyhydramnios affecting pregnancy in third trimester AFI 28 today at MFM  Encounter for supervision of normal first pregnancy in third trimester BPP with NST 6/8 today. 2 off for movement.  Reviewed IOL techniques.  SVE 3/50/-4 today with membrane sweep.  Reviewed labor warning signs and expectations for birth. Instructed to call office or come to hospital with persistent headache, vision changes, regular contractions, leaking of fluid, decreased fetal movement or vaginal bleeding.     Orders Placed This Encounter  Procedures   Protein / creatinine ratio, urine   POC Urinalysis Dipstick OB   No follow-ups on file.   No future appointments.  For next visit:  Postpartum follow up     Subjective   33 y.o. G2P1001 at [redacted]w[redacted]d presents for this follow-up prenatal visit.  Patient no concerns today. Questions about IOL Patient reports: Movement: Present Contractions: Irregular  Objective   Flow sheet Vitals: Pulse Rate: 70 BP: (!) 144/89 Fundal Height: 40 cm Fetal Heart Rate (bpm): 145 Presentation: Vertex (US  today) Dilation: 2.5 Effacement (%): 50 Station: Ballotable Total weight gain: 33 lb 9.6 oz (15.2 kg)  General Appearance  No acute distress, well appearing, and well nourished Pulmonary   Normal work of breathing Neurologic   Alert and oriented to person, place, and time Psychiatric   Mood and affect within normal limits  Donato Fu, CNM  04/17/253:38 PM

## 2023-12-24 NOTE — Procedures (Signed)
 Wendy Johnston [birth year] [redacted]w[redacted]d  Fetus A Non-Stress Test Interpretation for 12/24/23  Indication: Polyhydramnios and Obesity  Fetal Heart Rate A Mode: External Baseline Rate (A): 120 bpm Variability: Moderate Accelerations: 15 x 15 Decelerations: None Multiple birth?: No  Uterine Activity Mode: Toco Contraction Frequency (min): None Resting Tone Palpated: Relaxed  Interpretation (Fetal Testing) Nonstress Test Interpretation: Reactive Comments: Tracing reviewed by Dr. Arnie Bibber

## 2023-12-24 NOTE — Procedures (Deleted)
 Wendy Johnston 1991-03-27 [redacted]w[redacted]d  Fetus A Non-Stress Test Interpretation for 12/24/23  Indication: Polyhydramnios and Obesity  Fetal Heart Rate A Mode: External Baseline Rate (A): 125 bpm Variability: Moderate Accelerations: 15 x 15 Decelerations: None Multiple birth?: No  Uterine Activity Mode: Toco Contraction Frequency (min): None Resting Tone Palpated: Relaxed  Interpretation (Fetal Testing) Nonstress Test Interpretation: Reactive Comments: Tracing reviewed by Dr. Arnie Bibber

## 2023-12-24 NOTE — Assessment & Plan Note (Signed)
 AFI 28 today at MFM

## 2023-12-24 NOTE — Assessment & Plan Note (Signed)
 Elevated BP today. UPC sent. No symptoms of pre-e. Has IOL scheduled for Saturday

## 2023-12-24 NOTE — Assessment & Plan Note (Signed)
 BPP with NST 6/8 today. 2 off for movement.  Reviewed IOL techniques.  SVE 3/50/-4 today with membrane sweep.  Reviewed labor warning signs and expectations for birth. Instructed to call office or come to hospital with persistent headache, vision changes, regular contractions, leaking of fluid, decreased fetal movement or vaginal bleeding.

## 2023-12-25 LAB — PROTEIN / CREATININE RATIO, URINE
Creatinine, Urine: 55.8 mg/dL
Protein, Ur: 7.9 mg/dL
Protein/Creat Ratio: 142 mg/g{creat} (ref 0–200)

## 2023-12-25 LAB — CULTURE, BETA STREP (GROUP B ONLY): Strep Gp B Culture: NEGATIVE

## 2023-12-26 ENCOUNTER — Encounter: Payer: Self-pay | Admitting: Obstetrics and Gynecology

## 2023-12-26 ENCOUNTER — Inpatient Hospital Stay
Admission: EM | Admit: 2023-12-26 | Discharge: 2023-12-27 | DRG: 807 | Disposition: A | Discharge status: Home / Self Care | Attending: Certified Nurse Midwife | Admitting: Certified Nurse Midwife

## 2023-12-26 ENCOUNTER — Other Ambulatory Visit: Payer: Self-pay

## 2023-12-26 DIAGNOSIS — O99844 Bariatric surgery status complicating childbirth: Secondary | ICD-10-CM | POA: Diagnosis present

## 2023-12-26 DIAGNOSIS — O133 Gestational [pregnancy-induced] hypertension without significant proteinuria, third trimester: Secondary | ICD-10-CM

## 2023-12-26 DIAGNOSIS — Z3403 Encounter for supervision of normal first pregnancy, third trimester: Principal | ICD-10-CM

## 2023-12-26 DIAGNOSIS — O139 Gestational [pregnancy-induced] hypertension without significant proteinuria, unspecified trimester: Secondary | ICD-10-CM | POA: Diagnosis present

## 2023-12-26 DIAGNOSIS — O134 Gestational [pregnancy-induced] hypertension without significant proteinuria, complicating childbirth: Secondary | ICD-10-CM | POA: Diagnosis present

## 2023-12-26 DIAGNOSIS — Z3A38 38 weeks gestation of pregnancy: Secondary | ICD-10-CM | POA: Diagnosis not present

## 2023-12-26 DIAGNOSIS — O403XX0 Polyhydramnios, third trimester, not applicable or unspecified: Secondary | ICD-10-CM | POA: Diagnosis present

## 2023-12-26 LAB — PROTEIN / CREATININE RATIO, URINE
Creatinine, Urine: 204 mg/dL
Protein Creatinine Ratio: 0.08 mg/mg{creat} (ref 0.00–0.15)
Total Protein, Urine: 17 mg/dL

## 2023-12-26 LAB — CBC
HCT: 35.4 % — ABNORMAL LOW (ref 36.0–46.0)
Hemoglobin: 12.2 g/dL (ref 12.0–15.0)
MCH: 28.4 pg (ref 26.0–34.0)
MCHC: 34.5 g/dL (ref 30.0–36.0)
MCV: 82.3 fL (ref 80.0–100.0)
Platelets: 161 10*3/uL (ref 150–400)
RBC: 4.3 MIL/uL (ref 3.87–5.11)
RDW: 14 % (ref 11.5–15.5)
WBC: 6 10*3/uL (ref 4.0–10.5)
nRBC: 0 % (ref 0.0–0.2)

## 2023-12-26 LAB — COMPREHENSIVE METABOLIC PANEL WITH GFR
ALT: 12 U/L (ref 0–44)
AST: 23 U/L (ref 15–41)
Albumin: 2.4 g/dL — ABNORMAL LOW (ref 3.5–5.0)
Alkaline Phosphatase: 95 U/L (ref 38–126)
Anion gap: 7 (ref 5–15)
BUN: 10 mg/dL (ref 6–20)
CO2: 19 mmol/L — ABNORMAL LOW (ref 22–32)
Calcium: 8.3 mg/dL — ABNORMAL LOW (ref 8.9–10.3)
Chloride: 107 mmol/L (ref 98–111)
Creatinine, Ser: 0.62 mg/dL (ref 0.44–1.00)
GFR, Estimated: >60 mL/min (ref >60)
Glucose, Bld: 141 mg/dL — ABNORMAL HIGH (ref 70–99)
Potassium: 3.6 mmol/L (ref 3.5–5.1)
Sodium: 133 mmol/L — ABNORMAL LOW (ref 135–145)
Total Bilirubin: 0.5 mg/dL (ref 0.0–1.2)
Total Protein: 5.7 g/dL — ABNORMAL LOW (ref 6.5–8.1)

## 2023-12-26 LAB — TYPE AND SCREEN
ABO/RH(D): A POS
Antibody Screen: NEGATIVE

## 2023-12-26 MED ORDER — DOCUSATE SODIUM 100 MG PO CAPS
100.0000 mg | ORAL_CAPSULE | Freq: Two times a day (BID) | ORAL | Status: DC
  Administered 2023-12-27: 100 mg via ORAL
  Filled 2023-12-26: qty 1

## 2023-12-26 MED ORDER — LIDOCAINE HCL (PF) 1 % IJ SOLN
INTRAMUSCULAR | Status: AC
  Filled 2023-12-26: qty 30

## 2023-12-26 MED ORDER — PRENATAL MULTIVITAMIN CH: 1.0000 | ORAL_TABLET | Freq: Every day | ORAL | Status: DC

## 2023-12-26 MED ORDER — ONDANSETRON HCL 4 MG/2ML IJ SOLN: 4.0000 mg | Freq: Four times a day (QID) | INTRAMUSCULAR | Status: DC | PRN

## 2023-12-26 MED ORDER — COCONUT OIL OIL: 1.0000 | TOPICAL_OIL | Status: DC | PRN

## 2023-12-26 MED ORDER — DIBUCAINE (PERIANAL) 1 % EX OINT
1.0000 | TOPICAL_OINTMENT | CUTANEOUS | Status: DC | PRN
  Filled 2023-12-26: qty 28

## 2023-12-26 MED ORDER — ONDANSETRON HCL 4 MG PO TABS: 4.0000 mg | ORAL_TABLET | ORAL | Status: DC | PRN

## 2023-12-26 MED ORDER — MISOPROSTOL 200 MCG PO TABS
ORAL_TABLET | ORAL | Status: AC
  Filled 2023-12-26: qty 4

## 2023-12-26 MED ORDER — FERROUS SULFATE 325 (65 FE) MG PO TABS
325.0000 mg | ORAL_TABLET | Freq: Every day | ORAL | Status: DC
  Administered 2023-12-27: 325 mg via ORAL
  Filled 2023-12-26: qty 1

## 2023-12-26 MED ORDER — OXYCODONE HCL 5 MG PO TABS: 10.0000 mg | ORAL_TABLET | ORAL | Status: DC | PRN

## 2023-12-26 MED ORDER — ACETAMINOPHEN 325 MG PO TABS
650.0000 mg | ORAL_TABLET | ORAL | Status: DC | PRN
  Administered 2023-12-26 – 2023-12-27 (×4): 650 mg via ORAL
  Filled 2023-12-26 (×6): qty 2

## 2023-12-26 MED ORDER — ONDANSETRON HCL 4 MG/2ML IJ SOLN: 4.0000 mg | INTRAMUSCULAR | Status: DC | PRN

## 2023-12-26 MED ORDER — LACTATED RINGERS IV SOLN
INTRAVENOUS | Status: DC
Start: 2023-12-26 — End: 2023-12-28

## 2023-12-26 MED ORDER — OXYTOCIN 10 UNIT/ML IJ SOLN
INTRAMUSCULAR | Status: AC
  Filled 2023-12-26: qty 2

## 2023-12-26 MED ORDER — OXYTOCIN-SODIUM CHLORIDE 30-0.9 UT/500ML-% IV SOLN: 1.0000 m[IU]/min | INTRAVENOUS | Status: DC

## 2023-12-26 MED ORDER — AMMONIA AROMATIC IN INHA
RESPIRATORY_TRACT | Status: AC
  Filled 2023-12-26: qty 10

## 2023-12-26 MED ORDER — OXYTOCIN BOLUS FROM INFUSION
333.0000 mL | Freq: Once | INTRAVENOUS | Status: AC
  Administered 2023-12-26: 333 mL via INTRAVENOUS

## 2023-12-26 MED ORDER — MISOPROSTOL 25 MCG QUARTER TABLET: 25.0000 ug | ORAL_TABLET | Freq: Once | ORAL | Status: DC

## 2023-12-26 MED ORDER — SIMETHICONE 80 MG PO CHEW: 80.0000 mg | CHEWABLE_TABLET | ORAL | Status: DC | PRN

## 2023-12-26 MED ORDER — OXYTOCIN-SODIUM CHLORIDE 30-0.9 UT/500ML-% IV SOLN
INTRAVENOUS | Status: AC
  Administered 2023-12-26: 2 m[IU]/min via INTRAVENOUS
  Filled 2023-12-26: qty 500

## 2023-12-26 MED ORDER — OXYTOCIN-SODIUM CHLORIDE 30-0.9 UT/500ML-% IV SOLN: 2.5000 [IU]/h | INTRAVENOUS | Status: DC

## 2023-12-26 MED ORDER — WITCH HAZEL-GLYCERIN EX PADS
1.0000 | MEDICATED_PAD | CUTANEOUS | Status: DC | PRN
  Filled 2023-12-26: qty 100

## 2023-12-26 MED ORDER — BENZOCAINE-MENTHOL 20-0.5 % EX AERO
1.0000 | INHALATION_SPRAY | CUTANEOUS | Status: DC | PRN
  Filled 2023-12-26: qty 56

## 2023-12-26 MED ORDER — SENNOSIDES-DOCUSATE SODIUM 8.6-50 MG PO TABS
2.0000 | ORAL_TABLET | ORAL | Status: DC
  Administered 2023-12-26: 2 via ORAL
  Filled 2023-12-26: qty 2

## 2023-12-26 MED ORDER — LACTATED RINGERS IV SOLN: 500.0000 mL | INTRAVENOUS | Status: DC | PRN

## 2023-12-26 MED ORDER — OXYTOCIN 10 UNIT/ML IJ SOLN: 10.0000 [IU] | Freq: Once | INTRAMUSCULAR | Status: DC

## 2023-12-26 MED ORDER — TERBUTALINE SULFATE 1 MG/ML IJ SOLN: 0.2500 mg | Freq: Once | INTRAMUSCULAR | Status: DC | PRN

## 2023-12-26 MED ORDER — OXYCODONE-ACETAMINOPHEN 5-325 MG PO TABS: 1.0000 | ORAL_TABLET | ORAL | Status: DC | PRN

## 2023-12-26 MED ORDER — OXYCODONE-ACETAMINOPHEN 5-325 MG PO TABS: 2.0000 | ORAL_TABLET | ORAL | Status: DC | PRN

## 2023-12-26 MED ORDER — LIDOCAINE HCL (PF) 1 % IJ SOLN: 30.0000 mL | INTRAMUSCULAR | Status: DC | PRN

## 2023-12-26 MED ORDER — OXYCODONE HCL 5 MG PO TABS: 5.0000 mg | ORAL_TABLET | ORAL | Status: DC | PRN

## 2023-12-26 MED ORDER — SOD CITRATE-CITRIC ACID 500-334 MG/5ML PO SOLN: 30.0000 mL | ORAL | Status: DC | PRN

## 2023-12-26 NOTE — H&P (Signed)
 History and Physical   HPI  Wendy Johnston is a 33 y.o. G2P1001 at [redacted]w[redacted]d Estimated Date of Delivery: 01/08/24 who is being admitted for induction of labor for gestational hypertension.    OB History  OB History  Gravida Para Term Preterm AB Living  2 1 1  0 0 1  SAB IAB Ectopic Multiple Live Births  0 0 0 0 1    # Outcome Date GA Lbr Len/2nd Weight Sex Type Anes PTL Lv  2 Current           1 Term 11/23/21   3310 g M Vag-Spont   LIV     Name: [name]    PROBLEM LIST  Pregnancy complications or risks: Patient Active Problem List   Diagnosis Date Noted   Gestational hypertension 12/18/2023   Family history of birth defect (FOB with esophageal atresia) 11/25/2023   Polyhydramnios affecting pregnancy in third trimester 11/06/2023   Pruritus 10/19/2023   Encounter for supervision of normal first pregnancy in third trimester 06/01/2023   B12 deficiency 07/08/2021   Previous gastric bypass affecting pregnancy, antepartum 03/27/2021   Obesity affecting pregnancy 03/27/2021   Iron  deficiency anemia 11/26/2020   PCOS (polycystic ovarian syndrome) 01/04/2017    Prenatal labs and studies: ABO, Rh: --/--/PENDING (04/19 0857) Antibody: PENDING (04/19 0857) Rubella: 1.22 (10/21 1445) RPR: Non Reactive (02/10 1613)  HBsAg: Negative (10/21 1445)  HIV: Non Reactive (02/10 1613)  WUJ:WJXBJYNW/-- (04/14 1641)   Past Medical History:  Diagnosis Date   Anemia    Anxiety    Asthma    Depression    PCOS (polycystic ovarian syndrome)    Uterine adenomyoma    JUVENILE CYSTIC     Past Surgical History:  Procedure Laterality Date   GASTRIC ROUX-EN-Y     LAPAROSCOPIC GASTRIC BYPASS  02/24/2017   WISDOM TOOTH EXTRACTION  2015     Medications    Current Discharge Medication List     CONTINUE these medications which have NOT CHANGED   Details  albuterol (PROVENTIL) (2.5 MG/3ML) 0.083% nebulizer solution Take 2.5 mg by nebulization every 4 (four) hours as  needed for wheezing or shortness of breath.    Albuterol-Budesonide 90-80 MCG/ACT AERO     aspirin  EC 81 MG tablet Take 1 tablet (81 mg total) by mouth daily. Take after 12 weeks for prevention of preeclampssia later in pregnancy Qty: 300 tablet, Refills: 2   Associated Diagnoses: Obesity affecting pregnancy in third trimester, unspecified obesity type    calcium carbonate (TUMS EX) 750 MG chewable tablet Chew 1 tablet by mouth 3 (three) times daily. Prn    Ferrous Sulfate  (IRON  PO) Take 2 tablets by mouth daily.    Fluticasone-Umeclidin-Vilant (TRELEGY ELLIPTA) 100-62.5-25 MCG/ACT AEPB Take 1 Inhalation by mouth daily.    magnesium  oxide (MAG-OX) 400 MG tablet Take 400 mg by mouth daily.    montelukast (SINGULAIR) 10 MG tablet     omeprazole (PRILOSEC) 40 MG capsule Take 40 mg by mouth daily.    Prenatal MV & Min w/FA-DHA (PRENATAL GUMMIES PO) Take by mouth.    sertraline  (ZOLOFT ) 50 MG tablet Take 1 tablet (50 mg total) by mouth daily. Qty: 30 tablet, Refills: 11   Associated Diagnoses: Supervision of other normal pregnancy, antepartum    sodium chloride  (OCEAN) 0.65 % SOLN nasal spray Place 1 spray into both nostrils as needed for congestion.         Allergies  Nsaids  Review of Systems  Constitutional: negative Eyes: negative Ears, nose, mouth, throat, and face: negative Respiratory: negative Cardiovascular: negative Gastrointestinal: negative Genitourinary:negative Integument/breast: negative Hematologic/lymphatic: negative Musculoskeletal:negative Neurological: negative Behavioral/Psych: negative Endocrine: negative Allergic/Immunologic: negative  Physical Exam  BP (!) 142/85 (BP Location: Left Arm)   Pulse 77   Temp 97.8 F (36.6 C) (Oral)   Resp 16   Ht 5\' 1"  (1.549 m)   Wt 87.8 kg   LMP 04/03/2023 (Exact Date)   BMI 36.58 kg/m   Lungs:  CTA B Cardio: RRR without M/R/G Abd: Soft, gravid, NT Presentation: cephalic EXT: No C/C/ 1+  Edema DTRs: 2+ B CERVIX: Dilation: 3 Effacement (%): 70 Cervical Position: Posterior Station: -3 Presentation: Vertex Exam by:: Hildy Lowers, CNM  See Prenatal records for more detailed PE.     FHR:  Baseline: 130 bpm, Variability: Good {> 6 bpm), Accelerations: Reactive, and Decelerations: Absent  Toco: Uterine Contractions: irritability   Test Results  Results for orders placed or performed during the hospital encounter of 12/26/23 (from the past 24 hours)  CBC     Status: Abnormal   Collection Time: 12/26/23  8:57 AM  Result Value Ref Range   WBC 6.0 4.0 - 10.5 K/uL   RBC 4.30 3.87 - 5.11 MIL/uL   Hemoglobin 12.2 12.0 - 15.0 g/dL   HCT 16.1 (L) 09.6 - 04.5 %   MCV 82.3 80.0 - 100.0 fL   MCH 28.4 26.0 - 34.0 pg   MCHC 34.5 30.0 - 36.0 g/dL   RDW 40.9 81.1 - 91.4 %   Platelets 161 150 - 400 K/uL   nRBC 0.0 0.0 - 0.2 %  Type and screen     Status: None (Preliminary result)   Collection Time: 12/26/23  8:57 AM  Result Value Ref Range   ABO/RH(D) PENDING    Antibody Screen PENDING    Sample Expiration      12/29/2023,2359 Performed at [facility], [address] [place], [place] [address]    Group B Strep negative  Assessment   G2P1001 at [redacted]w[redacted]d Estimated Date of Delivery: 01/08/24  The fetus is reassuring.   Patient Active Problem List   Diagnosis Date Noted   Gestational hypertension 12/18/2023   Family history of birth defect (FOB with esophageal atresia) 11/25/2023   Polyhydramnios affecting pregnancy in third trimester 11/06/2023   Pruritus 10/19/2023   Encounter for supervision of normal first pregnancy in third trimester 06/01/2023   B12 deficiency 07/08/2021   Previous gastric bypass affecting pregnancy, antepartum 03/27/2021   Obesity affecting pregnancy 03/27/2021   Iron  deficiency anemia 11/26/2020   PCOS (polycystic ovarian syndrome) 01/04/2017    Plan  1. Admit to L&D :   2. EFM:-- Category 1 3. IV pain medication or Epidural if  desired.   4. Admission labs  5. Anticipate NSVD 6.Dr. Alvia Awkward notified of admission and plan of care  Alise Appl, [facility]  12/26/2023 9:38 AM

## 2023-12-26 NOTE — Progress Notes (Signed)
 LABOR NOTE   Wendy Johnston 32 y.o.GP@ at [redacted]w[redacted]d  SUBJECTIVE:  Pt sitting on birth ball beside bed. She denies pain .  Analgesia: Labor support without medications  OBJECTIVE:  BP (!) 142/85 (BP Location: Left Arm)   Pulse 77   Temp 97.8 F (36.6 C) (Oral)   Resp 16   Ht 5\' 1"  (1.549 m)   Wt 87.8 kg   LMP 04/03/2023 (Exact Date)   BMI 36.58 kg/m  No intake/output data recorded.   CERVIX: deferred SVE:   Dilation: 3 Effacement (%): 70 Station: -3 Exam by:: Hildy Lowers, CNM CONTRACTIONS: regular, every 4-5 minutes FHR: Fetal heart tracing reviewed. Baseline: 135 bpm, Variability: Good {> 6 bpm), Accelerations: Reactive, and Decelerations: Absent Category I    Labs: Lab Results  Component Value Date   WBC 6.0 12/26/2023   HGB 12.2 12/26/2023   HCT 35.4 (L) 12/26/2023   MCV 82.3 12/26/2023   PLT 161 12/26/2023    ASSESSMENT: 1) Labor curve reviewed.       Progress: Not in labor.     Membranes: intact           Principal Problem:   Gestational hypertension     Overview: -  Urine collected today         PLAN: continue present management   Alise Appl, CNM  12/26/2023 11:37 AM

## 2023-12-26 NOTE — Discharge Summary (Signed)
 Postpartum Discharge Summary  Date of Service updated***     Patient Name: Wendy Johnston DOB: [birth year] MRN: [id number]  Date of admission: 12/26/2023 Delivery date:12/26/2023 Delivering provider: Alise Appl Date of discharge: 12/26/2023  Admitting diagnosis: Gestational hypertension [O13.9] Intrauterine pregnancy: [redacted]w[redacted]d     Secondary diagnosis:  Principal Problem:   Gestational hypertension Polyhydramnios  Additional problems: none    Discharge diagnosis: Term Pregnancy Delivered and Gestational Hypertension                                              Post partum procedures:{Postpartum procedures:23558} Augmentation: AROM and Pitocin  Complications: None  Hospital course: Induction of Labor With Vaginal Delivery   33 y.o. yo G2P1001 at [redacted]w[redacted]d was admitted to the hospital 12/26/2023 for induction of labor.  Indication for induction: Gestational hypertension.  Patient had an labor course uncomplicated. Membrane Rupture Time/Date: 5:06 PM,12/26/2023  Delivery Method:Vaginal, Spontaneous Operative Delivery:N/A Episiotomy: None Lacerations:  1st degree vaginal, right periurethral . Hemostatic no repair  Details of delivery can be found in separate delivery note.  Patient had a postpartum course complicated by***. Patient is discharged home 12/26/23.  Newborn Data: Birth date:12/26/2023 Birth time:6:29 PM Gender:Female Living status:Living Apgars:8 ,9  Weight:   Magnesium  Sulfate received: No BMZ received: No Rhophylac:N/A MMR:N/A T-DaP:Given prenatally Flu: N/A RSV Vaccine received: No Transfusion:No Immunizations administered: Immunization History  Administered Date(s) Administered   Influenza, Seasonal, Injecte, Preservative Fre 10/01/2023   Moderna Covid-19 Vaccine Bivalent Booster 65yrs & up 10/23/2020   Moderna Sars-Covid-2 Vaccination 11/03/2019, 12/01/2019   Tdap 09/12/2021, 10/19/2023    Physical exam  Vitals:   12/26/23 1533 12/26/23 1555  12/26/23 1700 12/26/23 1800  BP: (!) 168/83 133/80 133/82 (!) 157/91  Pulse: (!) 59 61 61 64  Resp:   16   Temp:   97.9 F (36.6 C)   TempSrc:   Oral   Weight:      Height:       General: {Exam; general:21111117} Lochia: {Desc; appropriate/inappropriate:30686::"appropriate"} Uterine Fundus: {Desc; firm/soft:30687} Incision: {Exam; incision:21111123} DVT Evaluation: {Exam; dvt:2111122} Labs: Lab Results  Component Value Date   WBC 6.0 12/26/2023   HGB 12.2 12/26/2023   HCT 35.4 (L) 12/26/2023   MCV 82.3 12/26/2023   PLT 161 12/26/2023      Latest Ref Rng & Units 12/26/2023    8:57 AM  CMP  Glucose 70 - 99 mg/dL 829   BUN 6 - 20 mg/dL 10   Creatinine 5.62 - 1.00 mg/dL 1.30   Sodium 865 - 784 mmol/L 133   Potassium 3.5 - 5.1 mmol/L 3.6   Chloride 98 - 111 mmol/L 107   CO2 22 - 32 mmol/L 19   Calcium 8.9 - 10.3 mg/dL 8.3   Total Protein 6.5 - 8.1 g/dL 5.7   Total Bilirubin 0.0 - 1.2 mg/dL 0.5   Alkaline Phos 38 - 126 U/L 95   AST 15 - 41 U/L 23   ALT 0 - 44 U/L 12    Edinburgh Score:    06/01/2023    3:33 PM  Edinburgh Postnatal Depression Scale Screening Tool  I have been able to laugh and see the funny side of things. 0  I have looked forward with enjoyment to things. 0  I have blamed myself unnecessarily when things went wrong. 0  I have been anxious or worried  for no good reason. 0  I have felt scared or panicky for no good reason. 0  Things have been getting on top of me. 0  I have been so unhappy that I have had difficulty sleeping. 0  I have felt sad or miserable. 0  I have been so unhappy that I have been crying. 0  The thought of harming myself has occurred to me. 0  Edinburgh Postnatal Depression Scale Total 0      After visit meds:  Allergies as of 12/26/2023       Reactions   Nsaids    Patient has history of gastric bypass.       Med Rec must be completed prior to using this [facility]***        Discharge home in stable  condition Infant Feeding: {Baby feeding:23562} Infant Disposition:{CHL IP OB HOME WITH ZOXWRU:04540} Discharge instruction: per After Visit Summary and Postpartum booklet. Activity: Advance as tolerated. Pelvic rest for 6 weeks.  Diet: {OB diet:21111121} Anticipated Birth Control: {Birth Control:23956} Postpartum Appointment:{Outpatient follow up:23559} Additional Postpartum F/U: {PP Procedure:23957} Future Appointments:No future appointments. Follow up Visit:      12/26/2023 Alise Appl, CNM

## 2023-12-26 NOTE — Progress Notes (Signed)
 LABOR NOTE   Adina Puzzo 33 y.o.GP@ at [redacted]w[redacted]d  SUBJECTIVE:  Feeling rectal pressure  Analgesia: Labor support without medications  OBJECTIVE:  BP 133/80   Pulse 61   Temp 97.9 F (36.6 C)   Resp 16   Ht 5\' 1"  (1.549 m)   Wt 87.8 kg   LMP 04/03/2023 (Exact Date)   BMI 36.58 kg/m  No intake/output data recorded.  She has shown cervical change. CERVIX: 6-7 cm:  80%:   -1:   mid position:   soft SVE:   Dilation: 6.5 Effacement (%): 80 Station: -1 Exam by:: Hildy Lowers, CNM CONTRACTIONS: regular, every 2-3 minutes FHR: Fetal heart tracing reviewed. Baseline: 125 bpm, Variability: Good {> 6 bpm), Accelerations: Reactive, and Decelerations: Absent Category I    Labs: Lab Results  Component Value Date   WBC 6.0 12/26/2023   HGB 12.2 12/26/2023   HCT 35.4 (L) 12/26/2023   MCV 82.3 12/26/2023   PLT 161 12/26/2023    ASSESSMENT: 1) Labor curve reviewed.       Progress: latent labor     Membranes: ruptured, clear fluid, with blood stain    Principal Problem:   Gestational hypertension     Overview: -  Urine collected today         PLAN: continue present management   Alise Appl, CNM  12/26/2023 5:11 PM

## 2023-12-26 NOTE — Progress Notes (Signed)
 LABOR NOTE   Wendy Johnston 33 y.o.GP@ at [redacted]w[redacted]d  SUBJECTIVE:  Feeling cramping with contractions Analgesia: Labor support without medications  OBJECTIVE:  BP (!) 141/91   Pulse 66   Temp 97.9 F (36.6 C)   Resp 16   Ht 5\' 1"  (1.549 m)   Wt 87.8 kg   LMP 04/03/2023 (Exact Date)   BMI 36.58 kg/m  No intake/output data recorded.  She has shown cervical change. CERVIX: 4-5cm:  80%:   -2:   posterior:   soft SVE:   Dilation: 4.5 Effacement (%): 80 Station: -2 Exam by:: Hildy Lowers, CNM CONTRACTIONS: regular, every 2 minutes FHR: Fetal heart tracing reviewed. Baseline: 140 bpm, Variability: Good {> 6 bpm), Accelerations: Reactive, and Decelerations: Absent Category I    Labs: Lab Results  Component Value Date   WBC 6.0 12/26/2023   HGB 12.2 12/26/2023   HCT 35.4 (L) 12/26/2023   MCV 82.3 12/26/2023   PLT 161 12/26/2023    ASSESSMENT: 1) Labor curve reviewed.       Progress: Active phase labor.     Membranes: intact            Principal Problem:   Gestational hypertension     Overview: -  Urine collected today         PLAN: expectant management. Dr. Alvia Awkward aware of progress.    Alise Appl, CNM  12/26/2023 2:30 PM

## 2023-12-27 LAB — CBC
HCT: 29.7 % — ABNORMAL LOW (ref 36.0–46.0)
Hemoglobin: 10.2 g/dL — ABNORMAL LOW (ref 12.0–15.0)
MCH: 28 pg (ref 26.0–34.0)
MCHC: 34.3 g/dL (ref 30.0–36.0)
MCV: 81.6 fL (ref 80.0–100.0)
Platelets: 179 10*3/uL (ref 150–400)
RBC: 3.64 MIL/uL — ABNORMAL LOW (ref 3.87–5.11)
RDW: 14 % (ref 11.5–15.5)
WBC: 10.3 10*3/uL (ref 4.0–10.5)
nRBC: 0 % (ref 0.0–0.2)

## 2023-12-27 LAB — RPR: RPR Ser Ql: NONREACTIVE

## 2023-12-27 MED ORDER — LABETALOL HCL 100 MG PO TABS: 100.0000 mg | ORAL_TABLET | Freq: Two times a day (BID) | ORAL | 0 refills | Status: DC

## 2023-12-27 MED ORDER — LABETALOL HCL 100 MG PO TABS
100.0000 mg | ORAL_TABLET | Freq: Two times a day (BID) | ORAL | Status: DC
  Administered 2023-12-27: 100 mg via ORAL
  Filled 2023-12-27 (×2): qty 1

## 2023-12-27 MED ORDER — ACETAMINOPHEN 325 MG PO TABS: 650.0000 mg | ORAL_TABLET | ORAL | Status: AC | PRN

## 2023-12-27 NOTE — Lactation Note (Signed)
 This note was copied from a baby's chart. Lactation Consultation Note  Patient Name: Wendy Johnston [id number] Date: 12/27/2023 Age:33 hours Reason for consult: Initial assessment;Early term 37-38.6wks;Maternal endocrine disorder   Maternal Data Has patient been taught Hand Expression?: Yes Does the patient have breastfeeding experience prior to this delivery?: Yes How long did the patient breastfeed?: 2 weeks then milk just dried up  Initial assessment w/ a 18hr old baby Wendy "[name]" and P2 patient.  This was a SVD.  Patient w/ hx of PCOS, gastric bypass surgery in 2018, B12 deficiency, and gHTN.  Patient stated that she really wanted to breastfeed by had a poor experience with her first. She verbalized to [facility] that her breast became engorged and then 2 days later her milk was gone.  She stated that infant was admitted for dehydration and not gaining weight.  She was pumping and not getting any milk out.  She stated she took supplements but nothing helped.  Her biggest goal is to bond and put infant to the breast.  Patient stated the only thing I didn't try which I was with this infant was an SNS.   Feeding Mother's Current Feeding Choice: Breast Milk and Formula  LC walked into patient attempting to wake infant for a feeding.  Infant started moving around, and patient placed him on the rt breast first in cradle hold.  Patient has good positioning with infant at the breast as well getting infant to latch.  Infant actively sucked for about 10 minutes, no audible swallows were heard.    After trying for 10 minutes, patient put him to the left breast in football hold.  She was able to latch infant where he actively fed.  LC brought in a SNS and taught family how to use.  During this time infant took in 12ml of formulat at the breast w/ the SNS.    LATCH Score Latch: Grasps breast easily, tongue down, lips flanged, rhythmical sucking.  Audible Swallowing: None  Type of Nipple: Everted at  rest and after stimulation  Comfort (Breast/Nipple): Soft / non-tender  Hold (Positioning): No assistance needed to correctly position infant at breast.  LATCH Score: 8  Lactation Tools Discussed/Used Tools: Pump;Supplemental Nutrition System Breast pump type: Double-Electric Breast Pump Pump Education: Setup, frequency, and cleaning Reason for Pumping: Low milk supply  Interventions Interventions: Breast feeding basics reviewed;Assisted with latch;Hand express;Adjust position;Support pillows;DEBP;Education  LC provided education on the following;  milk production expectations, hunger cues, day 1/2 wet/dirty diapers, hand expression, cluster feeding, benefits of STS and arousing infant for a feeding.  Lactation informed patient of feeding infant at least 8 or more times w/in a 24hr period but not exceeding 3hrs. Patient verbalized understanding.   Discharge Pump: Personal;Hands Heydi Swango;Manual;DEBP  Consult Status Consult Status: Follow-up Follow-up type: In-patient    Shatara Stanek S Gissel Keilman 12/27/2023, 3:02 PM

## 2023-12-27 NOTE — Plan of Care (Signed)
 Pt. V/o of all Education. She is alert and oriented with pleasant affect. Color good, skin w&d and she denies any c/o. She demonstrates ability to care for self and Infant.

## 2023-12-27 NOTE — Progress Notes (Signed)
 Discharged to home as per order. Pt. V/o of all education.

## 2023-12-31 ENCOUNTER — Other Ambulatory Visit: Payer: Self-pay | Admitting: Licensed Practical Nurse

## 2023-12-31 ENCOUNTER — Telehealth: Payer: Self-pay

## 2023-12-31 ENCOUNTER — Encounter: Payer: Self-pay | Admitting: Licensed Practical Nurse

## 2023-12-31 MED ORDER — LABETALOL HCL 200 MG PO TABS: 200.0000 mg | ORAL_TABLET | Freq: Two times a day (BID) | ORAL | 3 refills | Status: DC

## 2023-12-31 NOTE — Telephone Encounter (Signed)
 Pt called triage she is 5 day postpartum. Was told by emily if her BP got over 150 to call and let her know so she can up her labetalol . Pt states she has no h/a or blurred vision but her bp has been 160/95 and 151/93.

## 2024-01-01 ENCOUNTER — Ambulatory Visit (INDEPENDENT_AMBULATORY_CARE_PROVIDER_SITE_OTHER): Admitting: Certified Nurse Midwife

## 2024-01-01 ENCOUNTER — Encounter: Payer: Self-pay | Admitting: Certified Nurse Midwife

## 2024-01-01 NOTE — Progress Notes (Signed)
   Postpartum Targeted Visit   Subjective:    Subjective  Wendy Johnston is a 33 y.o. female who presents for a postpartum visit. She is2 weeks  postpartum following a spontaneous vaginal delivery. I have fully reviewed the prenatal and intrapartum course. .  Anesthesia: none.    Postpartum course has been normal. She has no pain and no other concerns. Baby's course has been normal. Baby is feeding by  both breast and bottle - pumped mil and formula . She is getting 2-4 ounces a day when she is pumping. Her milk dried up with last kid around this time. She is happy with where she is at right now and knows to call if she desires Reglan .  Bleeding  normal today. Passed a large clot yesterday.  . Bowel function is normal. Bladder function is abnormal: endorses some leakage .  Aaron Aas  Postpartum depression screening: negative  Rayelle was discharged home on labetalol  100mg  BID. Called yesterday with increased pressures in the 150s/90s. Increased dosage to 200mg . Blood pressure controlled today.      Review of Systems Pertinent positives noted in HPI. Remainder of comprehensive ROS otherwise negative.     Objective:      Objective  There were no vitals taken for this visit.  General:  alert, cooperative, and no distress        Assessment/Plan:    1. Encounter for screening for maternal depression - Screening Negative today. Will rescreen at 6 week postpartum visit.     2. Postpartum state - Overall doing well. Continue routine postpartum home care.    3. Lactating mother - Will call if supply diminishes and desires Reglan   4. Gestational hypertension -continue on Labetalol  200mg  BID until 6 weeks visit -monitor BP at home daily. Call with perisitant elevated pressures.  -Pre-eclampsia precautions given.      Donato Fu, CNM

## 2024-01-02 ENCOUNTER — Other Ambulatory Visit: Payer: Self-pay

## 2024-01-02 ENCOUNTER — Ambulatory Visit
Admission: EM | Admit: 2024-01-02 | Discharge: 2024-01-03 | Disposition: A | Discharge status: Home / Self Care | Attending: Obstetrics and Gynecology | Admitting: Obstetrics and Gynecology

## 2024-01-02 ENCOUNTER — Encounter: Payer: Self-pay | Admitting: Emergency Medicine

## 2024-01-02 DIAGNOSIS — O135 Gestational [pregnancy-induced] hypertension without significant proteinuria, complicating the puerperium: Secondary | ICD-10-CM | POA: Diagnosis not present

## 2024-01-02 DIAGNOSIS — Z79899 Other long term (current) drug therapy: Secondary | ICD-10-CM | POA: Insufficient documentation

## 2024-01-02 DIAGNOSIS — N939 Abnormal uterine and vaginal bleeding, unspecified: Secondary | ICD-10-CM

## 2024-01-02 LAB — CBC WITH DIFFERENTIAL/PLATELET
Abs Immature Granulocytes: 0.26 10*3/uL — ABNORMAL HIGH (ref 0.00–0.07)
Basophils Absolute: 0.1 10*3/uL (ref 0.0–0.1)
Basophils Relative: 1 %
Eosinophils Absolute: 0.2 10*3/uL (ref 0.0–0.5)
Eosinophils Relative: 2 %
HCT: 35.6 % — ABNORMAL LOW (ref 36.0–46.0)
Hemoglobin: 11.8 g/dL — ABNORMAL LOW (ref 12.0–15.0)
Immature Granulocytes: 2 %
Lymphocytes Relative: 19 %
Lymphs Abs: 2.3 10*3/uL (ref 0.7–4.0)
MCH: 27.6 pg (ref 26.0–34.0)
MCHC: 33.1 g/dL (ref 30.0–36.0)
MCV: 83.2 fL (ref 80.0–100.0)
Monocytes Absolute: 0.6 10*3/uL (ref 0.1–1.0)
Monocytes Relative: 5 %
Neutro Abs: 8.6 10*3/uL — ABNORMAL HIGH (ref 1.7–7.7)
Neutrophils Relative %: 71 %
Platelets: 358 10*3/uL (ref 150–400)
RBC: 4.28 MIL/uL (ref 3.87–5.11)
RDW: 13.4 % (ref 11.5–15.5)
WBC: 12 10*3/uL — ABNORMAL HIGH (ref 4.0–10.5)
nRBC: 0 % (ref 0.0–0.2)

## 2024-01-02 LAB — COMPREHENSIVE METABOLIC PANEL WITH GFR
ALT: 21 U/L (ref 0–44)
AST: 21 U/L (ref 15–41)
Albumin: 3.2 g/dL — ABNORMAL LOW (ref 3.5–5.0)
Alkaline Phosphatase: 87 U/L (ref 38–126)
Anion gap: 10 (ref 5–15)
BUN: 14 mg/dL (ref 6–20)
CO2: 23 mmol/L (ref 22–32)
Calcium: 9 mg/dL (ref 8.9–10.3)
Chloride: 103 mmol/L (ref 98–111)
Creatinine, Ser: 0.56 mg/dL (ref 0.44–1.00)
GFR, Estimated: >60 mL/min (ref >60)
Glucose, Bld: 92 mg/dL (ref 70–99)
Potassium: 4.1 mmol/L (ref 3.5–5.1)
Sodium: 136 mmol/L (ref 135–145)
Total Bilirubin: 0.5 mg/dL (ref 0.0–1.2)
Total Protein: 6.9 g/dL (ref 6.5–8.1)

## 2024-01-02 LAB — URINALYSIS, ROUTINE W REFLEX MICROSCOPIC
Bacteria, UA: NONE SEEN
RBC / HPF: >50 RBC/hpf (ref 0–5)
Squamous Epithelial / HPF: 0 /HPF (ref 0–5)
WBC, UA: >50 WBC/hpf (ref 0–5)

## 2024-01-02 LAB — PROTEIN / CREATININE RATIO, URINE
Creatinine, Urine: 227 mg/dL
Protein Creatinine Ratio: 1.24 mg/mg{creat} — ABNORMAL HIGH (ref 0.00–0.15)
Total Protein, Urine: 282 mg/dL

## 2024-01-02 LAB — HCG, QUANTITATIVE, PREGNANCY: hCG, Beta Chain, Quant, S: 45 m[IU]/mL — ABNORMAL HIGH (ref <5)

## 2024-01-02 MED ORDER — TRANEXAMIC ACID-NACL 1000-0.7 MG/100ML-% IV SOLN
1000.0000 mg | INTRAVENOUS | Status: AC
  Administered 2024-01-02: 1000 mg via INTRAVENOUS
  Filled 2024-01-02: qty 100

## 2024-01-02 MED ORDER — LACTATED RINGERS IV SOLN: INTRAVENOUS | Status: DC

## 2024-01-02 MED ORDER — LABETALOL HCL 5 MG/ML IV SOLN: 10.0000 mg | Freq: Once | INTRAVENOUS | Status: DC

## 2024-01-02 MED ORDER — LABETALOL HCL 100 MG PO TABS
200.0000 mg | ORAL_TABLET | Freq: Once | ORAL | Status: AC
  Administered 2024-01-02: 200 mg via ORAL
  Filled 2024-01-02: qty 2

## 2024-01-02 MED ORDER — NORETHIN-ETH ESTRADIOL-FE 0.4-35 MG-MCG PO CHEW
2.0000 | CHEWABLE_TABLET | Freq: Every day | ORAL | Status: DC
  Administered 2024-01-03: 2 via ORAL
  Filled 2024-01-02 (×2): qty 2

## 2024-01-02 MED ORDER — NORETHIN-ETH ESTRADIOL-FE 0.4-35 MG-MCG PO CHEW
1.0000 | CHEWABLE_TABLET | Freq: Once | ORAL | Status: AC
  Administered 2024-01-02: 1 via ORAL
  Filled 2024-01-02: qty 1

## 2024-01-02 MED ORDER — ACETAMINOPHEN 500 MG PO TABS
1000.0000 mg | ORAL_TABLET | Freq: Four times a day (QID) | ORAL | Status: DC | PRN
  Administered 2024-01-02 – 2024-01-03 (×2): 1000 mg via ORAL
  Filled 2024-01-02 (×2): qty 2

## 2024-01-02 MED ORDER — NORETHIN-ETH ESTRADIOL-FE 0.4-35 MG-MCG PO CHEW: 1.0000 | CHEWABLE_TABLET | Freq: Once | ORAL | Status: DC

## 2024-01-02 NOTE — OB Triage Note (Signed)
 Patient is one week postpartum presents with complaint of clots and bleeding. Patient states that she has had a limited amount of bleeding since leaving the hospital, less than her normal periods until today. Patient states that she felt like she had "peed herself" then she realized it was blood and she said that she has had several "coin size clots". Patient changed her pad in the ED and needed a new one when she arrived to L&D, 90 EBL. New pad given to patient. VSS. Will continue to monitor.

## 2024-01-02 NOTE — ED Provider Notes (Signed)
 [facility] Provider Note    Event Date/Time   First MD Initiated Contact with Patient 01/02/24 1959     (approximate)   History   Postpartum Complications   HPI  Wendy Johnston is a 33 y.o. female 7 days postpartum who comes in with vaginal bleeding.  Patient reports having passed a clot a few days ago she let her OB doctor know about it but they said just continue to monitor.  Today she has had increasing vaginal bleeding with lots more blood and clots.  States is now slowed down a bit and she is really not having too many symptoms.  She does report that her blood pressure has continued to be elevated.  She is got gestational hypertension not preeclampsia and is on labetalol  100 twice daily but they recently changed her to 200 twice daily.  States that she has not yet taken her nighttime dose.  She denies any headaches, blurry vision, other symptoms related to this.  Physical Exam   Triage Vital Signs: ED Triage Vitals  Encounter Vitals Group     BP 01/02/24 1927 (!) 162/102     Systolic BP Percentile --      Diastolic BP Percentile --      Pulse Rate 01/02/24 1927 63     Resp 01/02/24 1927 18     Temp 01/02/24 1927 98.2 F (36.8 C)     Temp Source 01/02/24 1927 Oral     SpO2 01/02/24 1927 98 %     Weight 01/02/24 1926 174 lb 13.2 oz (79.3 kg)     Height 01/02/24 1926 5\' 1"  (1.549 m)     Head Circumference --      Peak Flow --      Pain Score 01/02/24 1925 2     Pain Loc --      Pain Education --      Exclude from Growth Chart --     Most recent vital signs: Vitals:   01/02/24 1927  BP: (!) 162/102  Pulse: 63  Resp: 18  Temp: 98.2 F (36.8 C)  SpO2: 98%     General: Awake, no distress.  CV:  Good peripheral perfusion.  Resp:  Normal effort.  Abd:  No distention.  Other:  + vaginal bleeding.    ED Results / Procedures / Treatments   Labs (all labs ordered are listed, but only abnormal results are displayed) Labs  Reviewed  CBC WITH DIFFERENTIAL/PLATELET - Abnormal; Notable for the following components:      Result Value   WBC 12.0 (*)    Hemoglobin 11.8 (*)    HCT 35.6 (*)    Neutro Abs 8.6 (*)    Abs Immature Granulocytes 0.26 (*)    All other components within normal limits  COMPREHENSIVE METABOLIC PANEL WITH GFR - Abnormal; Notable for the following components:   Albumin 3.2 (*)    All other components within normal limits  HCG, QUANTITATIVE, PREGNANCY  PROTEIN / CREATININE RATIO, URINE  URINALYSIS, ROUTINE W REFLEX MICROSCOPIC  POC URINE PREG, ED     RADIOLOGY Pending    PROCEDURES:  Critical Care performed: No  Procedures   MEDICATIONS ORDERED IN ED: Medications  labetalol  (NORMODYNE ) injection 10 mg (has no administration in time range)     IMPRESSION / MDM / ASSESSMENT AND PLAN / ED COURSE  I reviewed the triage vital signs and the nursing notes.   Patient's presentation is most consistent with acute presentation  with potential threat to life or bodily function.  suspect retained products of conception and gestational hypertension versus preeclampsia.  I had ordered 10 of IV labetalol  but patient had another large amount of blood from her vagina so I will call OB to see if they want patient transported up there first.    8:34 PM  CMP reassuring CBC reassuring no evidence of help syndrome.  This could all be gestational hypertension versus preeclampsia.  I have ordered the labetalol  but given patient is cleared to go up to OB by Climmie Damme cowen I discussed with patient try to place an IV and giving the labetalol  before going up patient preferred to go directly up to OB.  I discussed the case with the OB team who accepted patient to go upstairs.  they are aware that we have not given any IV medication for her blood pressure.  She has no other signs or symptoms of severe features other than the elevated blood pressure and this is a one-time check not 4 hours apart.  We also need  a urine test to see if this is actual preeclampsia versus gestational hypertension.  I discussed with patient and she prefers to go directly up to [facility] and not have an IV placed here and the OB team is aware of her high blood pressure and that we have not given any medications for this yet.  They stated that they would take care of her upstairs.  A nurse is at bedside and is going to wheel her upstairs. She seems stable for transport there at this time.      FINAL CLINICAL IMPRESSION(S) / ED DIAGNOSES   Final diagnoses:  None     Rx / DC Orders   ED Discharge Orders     None        Note:  This document was prepared using Dragon voice recognition software and may include unintentional dictation errors.   Lubertha Rush, MD 01/02/24 2034

## 2024-01-02 NOTE — H&P (Signed)
 History and Physical   HPI  Wendy Johnston is a 33 y.o. [phone] who had a NSVB exactly seven days ago. She presents today for heavy bleeding to the ER. Her lochia previous to Thursday was scant. Then two days ago she passed one fist sized clot but total lochia remained light. Then this evening around 1830 she felt like she had wet herself but upon going to the bathroom made the realization that she had bled heavily and her clothing was saturated in blood. The toilet bowel also seemed dark with blood. She then came to the ER. While in the ER she had a second episode of gushing, where she passed at least 6-7 small clots and sat on the toilet, the toilet bowel was dark from the blood. She was initially being assessed by the ER physician who then called this CNM and plan was made to transfer patient to OB/triage and for me to resume care. Patient immediately put on new pads/underwear and was wheeled to OB/triage. In that short time she bled through her pads, underwear and pants. The toilet yet again was dark with blood, and as she wiped there were several small clots. What could be measured was, and it totaled 90g although the majority of her blood loss was not measured at this point.  Additionally her initial blood pressure in the ER was 162/102. She was inducted for [facility]. About 18 hours postpartum she was started on labetalol  100mg  BID for the first time. Yesterday in clinic her dose was increased to 200mg  BID due to systolic pressures in the 150s. She took her morning dose this AM but has not yet taken her evening dose despite typically taking it around 1900. Denies visual changes, HA, RUQ pain.  Her birth was unremarkable with a QBL of . She did not require suturing.    OB History  OB History  Gravida Para Term Preterm AB Living  2 2 2  0 0 2  SAB IAB Ectopic Multiple Live Births  0 0 0 0 2    # Outcome Date GA Lbr Len/2nd Weight Sex Type Anes PTL Lv  2 Term 12/26/23 [redacted]w[redacted]d 03:57  / 00:05 2950 g M Vag-Spont None  LIV     Name: Wendy Johnston     Apgar1: 8  Apgar5: 9  1 Term 11/23/21   3310 g Melven Stable Vag-Spont   LIV     Name: [name]    PROBLEM LIST  Pregnancy complications or risks: Patient Active Problem List   Diagnosis Date Noted   Excessive postpartum bleeding 01/02/2024   Gestational hypertension 12/18/2023   Family history of birth defect (FOB with esophageal atresia) 11/25/2023   Polyhydramnios affecting pregnancy in third trimester 11/06/2023   Pruritus 10/19/2023   Encounter for supervision of normal first pregnancy in third trimester 06/01/2023   Postpartum care and examination 11/25/2021   Labor and delivery, indication for care 11/22/2021   B12 deficiency 07/08/2021   Previous gastric bypass affecting pregnancy, antepartum 03/27/2021   Obesity affecting pregnancy 03/27/2021   Iron  deficiency anemia 11/26/2020   PCOS (polycystic ovarian syndrome) 01/04/2017    H&H:  Hemoglobin  Date Value Ref Range Status  01/02/2024 11.8 (L) 12.0 - 15.0 g/dL Final  45/40/9811 91.4 (L) 12.0 - 15.0 g/dL Final  78/29/5621 30.8 12.0 - 15.0 g/dL Final  65/78/4696 29.5 12.0 - 15.0 g/dL Final  28/41/3244 01.0 11.1 - 15.9 g/dL Final  27/25/3664 40.3 (L) 12.0 - 15.0 g/dL Final  06/29/2023 12.0 11.1 - 15.9 g/dL Final  40/98/1191 47.8 12.0 - 15.0 g/dL Final  29/56/2130 86.5 12.0 - 15.0 g/dL Final  78/46/9629 52.8 11.1 - 15.9 g/dL Final  41/32/4401 02.7 11.1 - 15.9 g/dL Final     Past Medical History:  Diagnosis Date   Anemia    Anxiety    Asthma    Depression    PCOS (polycystic ovarian syndrome)    Uterine adenomyoma    JUVENILE CYSTIC     Past Surgical History:  Procedure Laterality Date   GASTRIC ROUX-EN-Y     LAPAROSCOPIC GASTRIC BYPASS  02/24/2017   WISDOM TOOTH EXTRACTION  2015     Medications    Current Discharge Medication List     CONTINUE these medications which have NOT CHANGED   Details  acetaminophen  (TYLENOL ) 325  MG tablet Take 2 tablets (650 mg total) by mouth every 4 (four) hours as needed (for pain scale < 4  OR  temperature  >/=  100.5 F).    albuterol (PROVENTIL) (2.5 MG/3ML) 0.083% nebulizer solution Take 2.5 mg by nebulization every 4 (four) hours as needed for wheezing or shortness of breath.    labetalol  (NORMODYNE ) 200 MG tablet Take 1 tablet (200 mg total) by mouth 2 (two) times daily. Qty: 60 tablet, Refills: 3    montelukast (SINGULAIR) 10 MG tablet     Prenatal MV & Min w/FA-DHA (PRENATAL GUMMIES PO) Take by mouth.    sertraline  (ZOLOFT ) 50 MG tablet Take 1 tablet (50 mg total) by mouth daily. Qty: 30 tablet, Refills: 11   Associated Diagnoses: Supervision of other normal pregnancy, antepartum         Allergies  Nsaids  Review of Systems  Pertinent items are noted in HPI.  Physical Exam  BP (!) 145/88 (BP Location: Right Arm)   Pulse 63   Temp 98.4 F (36.9 C) (Oral)   Resp 16   Ht 5\' 1"  (1.549 m)   Wt 79.3 kg   LMP 04/03/2023 (Exact Date)   SpO2 98%   Breastfeeding Yes   BMI 33.03 kg/m   Lungs:  CTA B Cardio: S1S2, RRR Abd: Soft, FF U-3 DTRs: 2+ B Lochia: episodes of gushing that then cease, with some walnut sized clots   Test Results  Results for orders placed or performed during the hospital encounter of 01/02/24 (from the past 24 hours)  CBC with Differential     Status: Abnormal   Collection Time: 01/02/24  7:28 PM  Result Value Ref Range   WBC 12.0 (H) 4.0 - 10.5 K/uL   RBC 4.28 3.87 - 5.11 MIL/uL   Hemoglobin 11.8 (L) 12.0 - 15.0 g/dL   HCT 25.3 (L) 66.4 - 40.3 %   MCV 83.2 80.0 - 100.0 fL   MCH 27.6 26.0 - 34.0 pg   MCHC 33.1 30.0 - 36.0 g/dL   RDW 47.4 25.9 - 56.3 %   Platelets 358 150 - 400 K/uL   nRBC 0.0 0.0 - 0.2 %   Neutrophils Relative % 71 %   Neutro Abs 8.6 (H) 1.7 - 7.7 K/uL   Lymphocytes Relative 19 %   Lymphs Abs 2.3 0.7 - 4.0 K/uL   Monocytes Relative 5 %   Monocytes Absolute 0.6 0.1 - 1.0 K/uL   Eosinophils Relative 2 %    Eosinophils Absolute 0.2 0.0 - 0.5 K/uL   Basophils Relative 1 %   Basophils Absolute 0.1 0.0 - 0.1 K/uL   Immature Granulocytes 2 %  Abs Immature Granulocytes 0.26 (H) 0.00 - 0.07 K/uL  Comprehensive metabolic panel     Status: Abnormal   Collection Time: 01/02/24  7:28 PM  Result Value Ref Range   Sodium 136 135 - 145 mmol/L   Potassium 4.1 3.5 - 5.1 mmol/L   Chloride 103 98 - 111 mmol/L   CO2 23 22 - 32 mmol/L   Glucose, Bld 92 70 - 99 mg/dL   BUN 14 6 - 20 mg/dL   Creatinine, Ser 1.61 0.44 - 1.00 mg/dL   Calcium 9.0 8.9 - 09.6 mg/dL   Total Protein 6.9 6.5 - 8.1 g/dL   Albumin 3.2 (L) 3.5 - 5.0 g/dL   AST 21 15 - 41 U/L   ALT 21 0 - 44 U/L   Alkaline Phosphatase 87 38 - 126 U/L   Total Bilirubin 0.5 0.0 - 1.2 mg/dL   GFR, Estimated >04 >54 mL/min   Anion gap 10 5 - 15  hCG, quantitative, pregnancy     Status: Abnormal   Collection Time: 01/02/24  7:28 PM  Result Value Ref Range   hCG, Beta Chain, Quant, S 45 (H) <5 mIU/mL     Assessment  G2P2002 7 days s/p NSVB GHTN- on labetalol  Heavy postpartum bleeding Hemodynamically stable  Patient Active Problem List   Diagnosis Date Noted   Excessive postpartum bleeding 01/02/2024   Gestational hypertension 12/18/2023   Family history of birth defect (FOB with esophageal atresia) 11/25/2023   Polyhydramnios affecting pregnancy in third trimester 11/06/2023   Pruritus 10/19/2023   Encounter for supervision of normal first pregnancy in third trimester 06/01/2023   Postpartum care and examination 11/25/2021   Labor and delivery, indication for care 11/22/2021   B12 deficiency 07/08/2021   Previous gastric bypass affecting pregnancy, antepartum 03/27/2021   Obesity affecting pregnancy 03/27/2021   Iron  deficiency anemia 11/26/2020   PCOS (polycystic ovarian syndrome) 01/04/2017    Plan  Consulted with Dr. Butch Cashing regarding plan of care. Will give TXA now, one OCP pill, run IV fluids, continue to monitor bleeding as  it is now reduced Place in observation Repeat CBC in AM If bleeding has ceased by morning will d/c home if stable, and have patient take two OCP pills daily for four days to help continue with stabilization of bleeding  Discussed plans with patient who is in agreement with plan of care   Lou Rounds, CNM, FNP 01/02/2024 9:37 PM

## 2024-01-02 NOTE — ED Notes (Signed)
 Called L&D due to pt being 1 week PP and was advised she be started down here for evaluation and provider contact OB if needed.

## 2024-01-02 NOTE — ED Triage Notes (Addendum)
 Pt presents to the ED via POV with complaints of heavy vaginal bleeding 1 week post-partum. Pt states she got out of bed and felt a "gush of blood" and she notes having a large amount of blood in the commode. Rates the cramping 2/10. Of note, pt delivered here last week. A&Ox4 at this time. Denies dizziness, CP or SOB.

## 2024-01-03 ENCOUNTER — Encounter: Payer: Self-pay | Admitting: Oncology

## 2024-01-03 ENCOUNTER — Observation Stay: Admitting: Certified Registered"

## 2024-01-03 ENCOUNTER — Encounter
Admission: EM | Disposition: A | Discharge status: Home / Self Care | Payer: Self-pay | Source: Home / Self Care | Attending: Emergency Medicine

## 2024-01-03 ENCOUNTER — Encounter: Payer: Self-pay | Admitting: Family

## 2024-01-03 ENCOUNTER — Other Ambulatory Visit: Payer: Self-pay

## 2024-01-03 DIAGNOSIS — Z30430 Encounter for insertion of intrauterine contraceptive device: Secondary | ICD-10-CM | POA: Diagnosis not present

## 2024-01-03 HISTORY — PX: INTRAUTERINE DEVICE (IUD) INSERTION: SHX5877

## 2024-01-03 HISTORY — PX: DILATION AND CURETTAGE OF UTERUS: SHX78

## 2024-01-03 LAB — CBC WITH DIFFERENTIAL/PLATELET
Abs Immature Granulocytes: 0.21 10*3/uL — ABNORMAL HIGH (ref 0.00–0.07)
Basophils Absolute: 0.1 10*3/uL (ref 0.0–0.1)
Basophils Relative: 1 %
Eosinophils Absolute: 0.2 10*3/uL (ref 0.0–0.5)
Eosinophils Relative: 2 %
HCT: 29.5 % — ABNORMAL LOW (ref 36.0–46.0)
Hemoglobin: 10.1 g/dL — ABNORMAL LOW (ref 12.0–15.0)
Immature Granulocytes: 2 %
Lymphocytes Relative: 19 %
Lymphs Abs: 1.9 10*3/uL (ref 0.7–4.0)
MCH: 27.8 pg (ref 26.0–34.0)
MCHC: 34.2 g/dL (ref 30.0–36.0)
MCV: 81.3 fL (ref 80.0–100.0)
Monocytes Absolute: 0.5 10*3/uL (ref 0.1–1.0)
Monocytes Relative: 5 %
Neutro Abs: 7 10*3/uL (ref 1.7–7.7)
Neutrophils Relative %: 71 %
Platelets: 294 10*3/uL (ref 150–400)
RBC: 3.63 MIL/uL — ABNORMAL LOW (ref 3.87–5.11)
RDW: 13.4 % (ref 11.5–15.5)
WBC: 9.9 10*3/uL (ref 4.0–10.5)
nRBC: 0 % (ref 0.0–0.2)

## 2024-01-03 LAB — TYPE AND SCREEN
ABO/RH(D): A POS
Antibody Screen: NEGATIVE

## 2024-01-03 SURGERY — DILATION AND CURETTAGE
Anesthesia: General | Site: Vagina

## 2024-01-03 MED ORDER — EPHEDRINE 5 MG/ML INJ
INTRAVENOUS | Status: AC
  Filled 2024-01-03: qty 5

## 2024-01-03 MED ORDER — PHENYLEPHRINE 80 MCG/ML (10ML) SYRINGE FOR IV PUSH (FOR BLOOD PRESSURE SUPPORT)
PREFILLED_SYRINGE | INTRAVENOUS | Status: AC
  Filled 2024-01-03: qty 10

## 2024-01-03 MED ORDER — LACTATED RINGERS IV SOLN: INTRAVENOUS | Status: DC

## 2024-01-03 MED ORDER — 0.9 % SODIUM CHLORIDE (POUR BTL) OPTIME
TOPICAL | Status: DC | PRN
  Administered 2024-01-03: 500 mL

## 2024-01-03 MED ORDER — HYDROCODONE-ACETAMINOPHEN 5-325 MG PO TABS
ORAL_TABLET | ORAL | Status: AC
  Filled 2024-01-03: qty 1

## 2024-01-03 MED ORDER — ONDANSETRON HCL 4 MG/2ML IJ SOLN
INTRAMUSCULAR | Status: DC | PRN
  Administered 2024-01-03: 4 mg via INTRAVENOUS

## 2024-01-03 MED ORDER — PROPOFOL 10 MG/ML IV BOLUS
INTRAVENOUS | Status: AC
  Filled 2024-01-03: qty 20

## 2024-01-03 MED ORDER — POVIDONE-IODINE 10 % EX SWAB: 2.0000 | Freq: Once | CUTANEOUS | Status: DC

## 2024-01-03 MED ORDER — OXYTOCIN 10 UNIT/ML IJ SOLN: INTRAMUSCULAR | Status: DC | PRN

## 2024-01-03 MED ORDER — OXYTOCIN 10 UNIT/ML IJ SOLN
INTRAMUSCULAR | Status: DC | PRN
  Administered 2024-01-03: 20 [IU]

## 2024-01-03 MED ORDER — NORETHIN-ETH ESTRADIOL-FE 0.4-35 MG-MCG PO CHEW: CHEWABLE_TABLET | ORAL | 0 refills | Status: DC

## 2024-01-03 MED ORDER — DEXAMETHASONE SODIUM PHOSPHATE 10 MG/ML IJ SOLN
INTRAMUSCULAR | Status: AC
  Filled 2024-01-03: qty 1

## 2024-01-03 MED ORDER — ONDANSETRON HCL 4 MG/2ML IJ SOLN
INTRAMUSCULAR | Status: AC
  Filled 2024-01-03: qty 2

## 2024-01-03 MED ORDER — LIDOCAINE HCL (PF) 2 % IJ SOLN
INTRAMUSCULAR | Status: DC | PRN
  Administered 2024-01-03: 50 mg

## 2024-01-03 MED ORDER — PHENYLEPHRINE 80 MCG/ML (10ML) SYRINGE FOR IV PUSH (FOR BLOOD PRESSURE SUPPORT)
PREFILLED_SYRINGE | INTRAVENOUS | Status: DC | PRN
  Administered 2024-01-03: 80 ug via INTRAVENOUS
  Administered 2024-01-03 (×2): 160 ug via INTRAVENOUS

## 2024-01-03 MED ORDER — GLYCOPYRROLATE 0.2 MG/ML IJ SOLN
INTRAMUSCULAR | Status: AC
Start: 2024-01-03 — End: ?
  Filled 2024-01-03: qty 1

## 2024-01-03 MED ORDER — DEXMEDETOMIDINE HCL IN NACL 80 MCG/20ML IV SOLN
INTRAVENOUS | Status: DC | PRN
Start: 2024-01-03 — End: 2024-01-03
  Administered 2024-01-03: 8 ug via INTRAVENOUS

## 2024-01-03 MED ORDER — LABETALOL HCL 100 MG PO TABS: 200.0000 mg | ORAL_TABLET | Freq: Two times a day (BID) | ORAL | Status: DC

## 2024-01-03 MED ORDER — DEXAMETHASONE SODIUM PHOSPHATE 10 MG/ML IJ SOLN
INTRAMUSCULAR | Status: DC | PRN
Start: 2024-01-03 — End: 2024-01-03
  Administered 2024-01-03: 10 mg via INTRAVENOUS

## 2024-01-03 MED ORDER — LEVONORGESTREL 20 MCG/DAY IU IUD
1.0000 | INTRAUTERINE_SYSTEM | INTRAUTERINE | Status: AC
  Administered 2024-01-03: 1 via INTRAUTERINE
  Filled 2024-01-03: qty 1

## 2024-01-03 MED ORDER — FENTANYL CITRATE (PF) 100 MCG/2ML IJ SOLN
INTRAMUSCULAR | Status: DC | PRN
  Administered 2024-01-03 (×2): 50 ug via INTRAVENOUS

## 2024-01-03 MED ORDER — HYDROCODONE-ACETAMINOPHEN 5-325 MG PO TABS: 1.0000 | ORAL_TABLET | Freq: Four times a day (QID) | ORAL | 0 refills | Status: DC | PRN

## 2024-01-03 MED ORDER — PROPOFOL 10 MG/ML IV BOLUS
INTRAVENOUS | Status: DC | PRN
  Administered 2024-01-03: 150 mg via INTRAVENOUS

## 2024-01-03 MED ORDER — GLYCOPYRROLATE 0.2 MG/ML IJ SOLN
INTRAMUSCULAR | Status: DC | PRN
  Administered 2024-01-03: .2 mg via INTRAVENOUS

## 2024-01-03 MED ORDER — NORETHIN-ETH ESTRADIOL-FE 0.4-35 MG-MCG PO CHEW
2.0000 | CHEWABLE_TABLET | Freq: Every day | ORAL | Status: AC
  Administered 2024-01-03: 2 via ORAL

## 2024-01-03 MED ORDER — OXYTOCIN 10 UNIT/ML IJ SOLN
INTRAMUSCULAR | Status: AC
  Filled 2024-01-03: qty 2

## 2024-01-03 MED ORDER — FENTANYL CITRATE (PF) 100 MCG/2ML IJ SOLN
INTRAMUSCULAR | Status: AC
  Filled 2024-01-03: qty 2

## 2024-01-03 MED ORDER — LIDOCAINE HCL (PF) 2 % IJ SOLN
INTRAMUSCULAR | Status: AC
  Filled 2024-01-03: qty 5

## 2024-01-03 MED ORDER — HYDROCODONE-ACETAMINOPHEN 5-325 MG PO TABS
1.0000 | ORAL_TABLET | Freq: Once | ORAL | Status: AC
  Administered 2024-01-03: 1 via ORAL

## 2024-01-03 MED ORDER — LEVONORGESTREL 20 MCG/DAY IU IUD
INTRAUTERINE_SYSTEM | INTRAUTERINE | Status: AC
  Filled 2024-01-03: qty 1

## 2024-01-03 MED ORDER — EPHEDRINE SULFATE-NACL 50-0.9 MG/10ML-% IV SOSY
PREFILLED_SYRINGE | INTRAVENOUS | Status: DC | PRN
Start: 2024-01-03 — End: 2024-01-03
  Administered 2024-01-03: 15 mg via INTRAVENOUS
  Administered 2024-01-03: 25 mg via INTRAVENOUS
  Administered 2024-01-03 (×2): 5 mg via INTRAVENOUS

## 2024-01-03 SURGICAL SUPPLY — 22 items
BASIN GRAD PLASTIC 32OZ STRL (MISCELLANEOUS) IMPLANT
DRSG TELFA 3X8 NADH STRL (GAUZE/BANDAGES/DRESSINGS) ×2 IMPLANT
GLOVE PI ORTHO PRO STRL 7.5 (GLOVE) ×2 IMPLANT
GOWN STRL REUS W/ TWL LRG LVL3 (GOWN DISPOSABLE) ×2 IMPLANT
HANDLE YANKAUER SUCT BULB TIP (MISCELLANEOUS) IMPLANT
KIT PROCEDURE FLUENT (KITS) ×2 IMPLANT
KIT TURNOVER CYSTO (KITS) ×2 IMPLANT
MANIFOLD NEPTUNE II (INSTRUMENTS) ×2 IMPLANT
Mirena IMPLANT
NS IRRIG 500ML POUR BTL (IV SOLUTION) ×2 IMPLANT
PACK DNC HYST (MISCELLANEOUS) ×2 IMPLANT
PAD OB MATERNITY 11 LF (PERSONAL CARE ITEMS) ×2 IMPLANT
PAD PREP OB/GYN DISP 24X41 (PERSONAL CARE ITEMS) ×2 IMPLANT
SCRUB CHG 4% DYNA-HEX 4OZ (MISCELLANEOUS) ×2 IMPLANT
SEAL ROD LENS SCOPE MYOSURE (ABLATOR) ×2 IMPLANT
SET CYSTO W/LG BORE CLAMP LF (SET/KITS/TRAYS/PACK) IMPLANT
SOL .9 NS 3000ML IRR UROMATIC (IV SOLUTION) ×2 IMPLANT
SUT VIC AB 0 SH 27 (SUTURE) IMPLANT
TOWEL OR 17X26 4PK STRL BLUE (TOWEL DISPOSABLE) ×2 IMPLANT
TRAP FLUID SMOKE EVACUATOR (MISCELLANEOUS) ×2 IMPLANT
VACURETTE 12 RIGID CVD (CANNULA) IMPLANT
WATER STERILE IRR 500ML POUR (IV SOLUTION) ×2 IMPLANT

## 2024-01-03 NOTE — Anesthesia Postprocedure Evaluation (Signed)
 Anesthesia Post Note  Patient: Wendy Johnston  Procedure(s) Performed: DILATION AND CURETTAGE (Vagina ) INSERTION, INTRAUTERINE DEVICE (Uterus)  Patient location during evaluation: PACU Anesthesia Type: General Level of consciousness: awake and alert Pain management: pain level controlled Vital Signs Assessment: post-procedure vital signs reviewed and stable Respiratory status: spontaneous breathing, nonlabored ventilation, respiratory function stable and patient connected to nasal cannula oxygen Cardiovascular status: blood pressure returned to baseline and stable Postop Assessment: no apparent nausea or vomiting Anesthetic complications: no   No notable events documented.   Last Vitals:  Vitals:   01/03/24 1119 01/03/24 1130  BP: 104/67 111/68  Pulse: (!) 106 80  Resp: 19 19  Temp:  (!) 36.1 C  SpO2: 97% 98%    Last Pain:  Vitals:   01/03/24 1130  TempSrc:   PainSc: 3                  Vanice Genre

## 2024-01-03 NOTE — Progress Notes (Signed)
  OB/Triage Progress Note   33 y.o. [id number] 8 days s/p NSVB, here with significant postpartum bleeding  Subjective:  Patient feels ok right now, denies dizziness. Has had a headache on/off throughout the night which has improved with tylenol .  Objective:  BP 114/74   Pulse 79   Temp 97.8 F (36.6 C) (Oral)   Resp 14   Ht 5\' 1"  (1.549 m)   Wt 79.3 kg   SpO2 98%   Breastfeeding Yes   BMI 33.03 kg/m   Patient's bleeding had decreased between midnight and 0400 but then started having some clots again. Now she has lost of blood since arriving to OB/triage. She has episodes of bleeding, not constant trickling.  Hgb this morning was 10.1, down from 11.8 on admission PC ratio elevated but likely inaccurate due to blood contamination, other preE labs WNL  Assessment  G2P2002 8 days s/p NSVB Delayed postpartum hemorrhage Mostly normotensive   Plan:   1. Consulted with Dr. Luster Salters and gave updates on patient. Current plan to keep patient NPO, give two OCP pills now, continue monitoring bleeding. If no improvement likely will require D&C 2. Report given to oncoming CNM Alise Appl.  All discussed with patient  Lou Rounds CNM, FNP [facility] 01/03/2024  8:17 AM

## 2024-01-03 NOTE — Anesthesia Preprocedure Evaluation (Signed)
 Anesthesia Evaluation  Patient identified by MRN, date of birth, ID band Patient awake    Reviewed: Allergy & Precautions, H&P , NPO status , Patient's Chart, lab work & pertinent test results, reviewed documented beta blocker date and time   History of Anesthesia Complications Negative for: history of anesthetic complications  Airway Mallampati: III  TM Distance: >3 FB Neck ROM: full    Dental  (+) Dental Advidsory Given, Teeth Intact, Missing, Chipped Permanent retainer on the bottom:   Pulmonary neg shortness of breath, asthma , neg sleep apnea, neg COPD, Recent URI    Pulmonary exam normal breath sounds clear to auscultation       Cardiovascular Exercise Tolerance: Good hypertension, (-) angina (-) Past MI and (-) Cardiac Stents Normal cardiovascular exam(-) dysrhythmias (-) Valvular Problems/Murmurs Rhythm:regular Rate:Normal     Neuro/Psych  PSYCHIATRIC DISORDERS Anxiety Depression    negative neurological ROS     GI/Hepatic Neg liver ROS,GERD  Controlled and Medicated,,  Endo/Other  negative endocrine ROS    Renal/GU negative Renal ROS  negative genitourinary   Musculoskeletal   Abdominal   Peds  Hematology  (+) Blood dyscrasia, anemia   Anesthesia Other Findings Past Medical History: No date: Anemia No date: Anxiety No date: Asthma No date: Depression No date: PCOS (polycystic ovarian syndrome) No date: Uterine adenomyoma     Comment:  JUVENILE CYSTIC   Reproductive/Obstetrics negative OB ROS                             Anesthesia Physical Anesthesia Plan  ASA: 2  Anesthesia Plan: General   Post-op Pain Management:    Induction: Intravenous  PONV Risk Score and Plan: 3 and Ondansetron , Dexamethasone, Midazolam and Treatment may vary due to age or medical condition  Airway Management Planned: LMA  Additional Equipment:   Intra-op Plan:   Post-operative Plan:  Extubation in OR  Informed Consent: I have reviewed the patients History and Physical, chart, labs and discussed the procedure including the risks, benefits and alternatives for the proposed anesthesia with the patient or authorized representative who has indicated his/her understanding and acceptance.     Dental Advisory Given  Plan Discussed with: Anesthesiologist, CRNA and Surgeon  Anesthesia Plan Comments:        Anesthesia Quick Evaluation

## 2024-01-03 NOTE — Interval H&P Note (Signed)
 History and Physical Interval Note:  01/03/2024 10:00 AM  Wendy Johnston  has presented today for surgery, with the diagnosis of Postpartum bleeding 1 week.  The various methods of treatment have been discussed with the patient. After consideration of risks, benefits and other options for treatment, the patient has consented to  Procedure(s): DILATION AND CURETTAGE (N/A) with IUD placement as a surgical intervention.  The patient's history has been reviewed, patient examined, no change in status, stable for surgery.  I have reviewed the patient's chart and labs.  Questions were answered to the patient's satisfaction.     Billy Bue MD

## 2024-01-03 NOTE — Anesthesia Procedure Notes (Signed)
 Procedure Name: LMA Insertion Date/Time: 01/03/2024 10:18 AM  Performed by: Kavin Parsley, CRNAPre-anesthesia Checklist: Patient identified, Patient being monitored, Timeout performed, Emergency Drugs available and Suction available Patient Re-evaluated:Patient Re-evaluated prior to induction Oxygen Delivery Method: Circle system utilized Preoxygenation: Pre-oxygenation with 100% oxygen Induction Type: IV induction Ventilation: Mask ventilation without difficulty LMA: LMA inserted LMA Size: 3.0 Tube type: Oral Number of attempts: 1 Placement Confirmation: positive ETCO2 and breath sounds checked- equal and bilateral Tube secured with: Tape Dental Injury: Teeth and Oropharynx as per pre-operative assessment

## 2024-01-03 NOTE — Transfer of Care (Signed)
 Immediate Anesthesia Transfer of Care Note  Patient: Wendy Johnston  Procedure(s) Performed: DILATION AND CURETTAGE (Vagina ) INSERTION, INTRAUTERINE DEVICE (Uterus)  Patient Location: PACU  Anesthesia Type:General  Level of Consciousness: sedated  Airway & Oxygen Therapy: Patient Spontanous Breathing and Patient connected to face mask oxygen  Post-op Assessment: Report given to RN and Post -op Vital signs reviewed and stable  Post vital signs: Reviewed  Last Vitals:  Vitals Value Taken Time  BP 93/51 01/03/24 1050  Temp    Pulse 65 01/03/24 1053  Resp 16 01/03/24 1053  SpO2 100 % 01/03/24 1053  Vitals shown include unfiled device data.  Last Pain:  Vitals:   01/03/24 0727  TempSrc: Oral  PainSc:       Patients Stated Pain Goal: 0 (01/03/24 0542)  Complications: No notable events documented.

## 2024-01-03 NOTE — Op Note (Signed)
   OPERATIVE NOTE 01/03/2024 10:42 AM  PRE-OPERATIVE DIAGNOSIS:  1) Postpartum bleeding 1 week  POST-OPERATIVE DIAGNOSIS:  1) Likely retained products of conception  OPERATION:  D&C  IUD placement  SURGEON(S): Surgeons and Role:    Zenobia Hila, MD - Primary   ANESTHESIA: Choice  ESTIMATED BLOOD LOSS:  OPERATIVE FINDINGS: There was some apparent retained tissue within the vagina based on curettage and suction curettage specimens obtained. Prior to surgery patient was having burst bleeding from the uterus.  When Pitocin  was run in the IV she had additional brisk bleeding but this became a small amount at the conclusion of the procedure.  SPECIMEN:  ID Type Source Tests Collected by Time Destination  1 : uterine content Tissue Path Tissue SURGICAL PATHOLOGY Zenobia Hila, MD 01/03/2024 1033     COMPLICATIONS: None  DRAINS: None  DISPOSITION: Stable to recovery room  DESCRIPTION OF PROCEDURE:      The patient was prepped and draped in the dorsal lithotomy position and placed under general anesthesia. Her bladder was emptied. The cervix was grasped with a ring forceps.  No dilation was necessary.  A large sharp curette was used to gently curette the entire endometrium.  Significant tissue and bleeding was noted.  A #12 suction curette was placed and a systematic curettage was performed respecting the position and curvature of the cervix and uterus.  A systematic curettage was performed in all quadrants until no additional tissue was noted. The uterus became firm and globular. Pitocin  was run in the IV.  A Mirena IUD was placed in the usual manner.  The strings were cut to the appropriate length.  The ring forcep was removed from the cervix and hemostasis was noted.  Only a small amount of bleeding was noted from the cervical os.  The weighted speculum was removed and the patient went to recovery room in stable condition.  No follow-up provider specified.  Delice Felt, M.D. 01/03/2024 10:42 AM

## 2024-01-03 NOTE — Progress Notes (Signed)
 OB/Triage Progress Note    33 y.o. G2P2002 8 days s/p NSVB, here with significant postpartum bleeding   Subjective:   Pt has passed more blood/clots . EBL now at 980 ml. She is c/o feeling dizzy and having nausea.   Objective:   Vitals:   01/03/24 0805 01/03/24 0835  BP: 114/74 113/63  Pulse: 79 70  Resp:    Temp:    SpO2:     G2P2002 8 days status post vaginal delivery, delayed postpartum hemorrhage.  Vital signs stable, however pt symptomatic with blood loss.   Dr.Evans notified of  EBL and pt status change now symptomatic with blood loss.   Plan:  Dr. Luster Salters contacted Operating Room to schedule D&C procedure this morning. Type and screen ordered. Pt to remain NPO.   Alise Appl, CNM

## 2024-01-04 ENCOUNTER — Encounter: Payer: Self-pay | Admitting: Obstetrics and Gynecology

## 2024-01-04 ENCOUNTER — Encounter: Payer: Self-pay | Admitting: Oncology

## 2024-01-05 ENCOUNTER — Encounter: Payer: Self-pay | Admitting: Obstetrics and Gynecology

## 2024-01-05 LAB — SURGICAL PATHOLOGY

## 2024-01-07 ENCOUNTER — Encounter: Payer: Self-pay | Admitting: Obstetrics and Gynecology

## 2024-01-07 ENCOUNTER — Ambulatory Visit
Admission: RE | Admit: 2024-01-07 | Discharge: 2024-01-07 | Disposition: A | Discharge status: Home / Self Care | Source: Ambulatory Visit | Attending: Certified Nurse Midwife | Admitting: Certified Nurse Midwife

## 2024-01-07 DIAGNOSIS — O9270 Unspecified disorders of lactation: Secondary | ICD-10-CM

## 2024-01-07 NOTE — Discharge Instructions (Addendum)
-  Continue to pump for the next 2 weeks on same schedule if possible, making adjustments as needed to simplify MOB's routine -Consider adding power-pumping sessions with Spectra  to change up pattern, looking for any increase or changes following 72 hours - Prior to/during pumping or nursing, utilize warm compress and hands-on gentle compressions to encourage stimulation and letting down of more milk. Be cautious in the event any inflammation occurs and cease immediately if redness/engorgement/pain begins to occur, following up with ice as needed for management post-feed/pump for relief. -Consider following up with outpatient clinic via phone or consult in 2 weeks-we care about you and your journey and are so inspired by the way you are taking care of yourself alongside your children.

## 2024-01-07 NOTE — Lactation Note (Signed)
 Lactation Consultation Note  Patient Name: Wendy Johnston [id number] Date: 01/07/2024 Age:33 y.o.     Maternal Data   MOB pt reports to outpatient clinic to assess pumping session, infant latch, and suggestions for increasing breast milk supply. MOB states her health is feeling much improved following recent procedure and denies any pain or discomfort at this time. MOB states she has been pumping every 2 hours during daylight hours and every 3 hours throughout the night at a max of 15 minutes per session with 18mm flange and Medela PISA DEBP, removing approx 4 ounces total in a 24 hour period. Infant is being supplemented with Enfamil formula 2-3 ounces every 3 hours. MOB and infant are both reported to be well-nourished, sleeping well, and satisfied with current breastfeeding status; however, MOB would like to increase production of supply and is hopeful that D&C has resolved some of the restrictions.  Feeding  MOB DEBP session observed with visible milk letdown, producing approx 20mL in 10 minute session. Baby latched to both breasts briefly following session, with infant gape, positioning, and intermittent suckling present. MOB reports that infant becomes easily frustrated by the lack of immediate release of milk as with nipple and bottle flow but is frequently putting infant to breast to encourage bonding/oxytocin  and capacity of transitioning to feeding more readily at breast when feasible.     Lactation Tools Discussed/Used   -Flange size appears to be correctly fit for MOB       Consult Status  Complete    Bronson Canny 01/07/2024, 2:08 PM

## 2024-01-12 ENCOUNTER — Ambulatory Visit (INDEPENDENT_AMBULATORY_CARE_PROVIDER_SITE_OTHER): Admitting: Obstetrics and Gynecology

## 2024-01-12 ENCOUNTER — Encounter: Payer: Self-pay | Admitting: Obstetrics and Gynecology

## 2024-01-12 VITALS — BP 117/89 | HR 93 | Ht 61.0 in | Wt 168.4 lb

## 2024-01-12 DIAGNOSIS — Z9889 Other specified postprocedural states: Secondary | ICD-10-CM

## 2024-01-12 MED ORDER — FERROUS SULFATE 325 (65 FE) MG PO TABS: 325.0000 mg | ORAL_TABLET | Freq: Two times a day (BID) | ORAL | 1 refills | Status: DC

## 2024-01-12 NOTE — Progress Notes (Signed)
 HPI:      [name] is a 33 y.o. [phone] who LMP was No LMP recorded.  Subjective:   She presents today 2 weeks postpartum, 1 week postop from retained products of conception.  She also had a Mirena  IUD placed.  She says that she is doing so much better.   She has a history of anemia and feels like she is just slowly getting over this.  Has previously received iron  infusions.  Is willing to take oral iron  but is not currently doing that. Reports small amounts of bleeding only at this time.    Hx: The following portions of the patient's history were reviewed and updated as appropriate:             She  has a past medical history of Anemia, Anxiety, Asthma, Depression, PCOS (polycystic ovarian syndrome), and Uterine adenomyoma. She does not have any pertinent problems on file. She  has a past surgical history that includes Laparoscopic gastric bypass (02/24/2017); Wisdom tooth extraction (2015); Gastric Roux-En-Y; Dilation and curettage of uterus (N/A, 01/03/2024); and Intrauterine device (iud) insertion (N/A, 01/03/2024). Her family history includes Cancer (age of onset: 13) in her father; Cervical cancer (age of onset: 45) in her mother; Diabetes in her maternal grandfather; Healthy in her brother and maternal grandmother; Heart attack in her paternal grandfather and paternal grandmother. She  reports that she has never smoked. She has never used smokeless tobacco. She reports that she does not drink alcohol and does not use drugs. She has a current medication list which includes the following prescription(s): acetaminophen , albuterol, ferrous sulfate , montelukast, prenatal mv & min w/fa-dha, sertraline , and labetalol . She is allergic to nsaids.       Review of Systems:  Review of Systems  Constitutional: Denied constitutional symptoms, night sweats, recent illness, fatigue, fever, insomnia and weight loss.  Eyes: Denied eye symptoms, eye pain, photophobia, vision change and  visual disturbance.  Ears/Nose/Throat/Neck: Denied ear, nose, throat or neck symptoms, hearing loss, nasal discharge, sinus congestion and sore throat.  Cardiovascular: Denied cardiovascular symptoms, arrhythmia, chest pain/pressure, edema, exercise intolerance, orthopnea and palpitations.  Respiratory: Denied pulmonary symptoms, asthma, pleuritic pain, productive sputum, cough, dyspnea and wheezing.  Gastrointestinal: Denied, gastro-esophageal reflux, melena, nausea and vomiting.  Genitourinary: Denied genitourinary symptoms including symptomatic vaginal discharge, pelvic relaxation issues, and urinary complaints.  Musculoskeletal: Denied musculoskeletal symptoms, stiffness, swelling, muscle weakness and myalgia.  Dermatologic: Denied dermatology symptoms, rash and scar.  Neurologic: Denied neurology symptoms, dizziness, headache, neck pain and syncope.  Psychiatric: Denied psychiatric symptoms, anxiety and depression.  Endocrine: Denied endocrine symptoms including hot flashes and night sweats.   Meds:   Current Outpatient Medications on File Prior to Visit  Medication Sig Dispense Refill   acetaminophen  (TYLENOL ) 325 MG tablet Take 2 tablets (650 mg total) by mouth every 4 (four) hours as needed (for pain scale < 4  OR  temperature  >/=  100.5 F).     albuterol (PROVENTIL) (2.5 MG/3ML) 0.083% nebulizer solution Take 2.5 mg by nebulization every 4 (four) hours as needed for wheezing or shortness of breath.     montelukast (SINGULAIR) 10 MG tablet      Prenatal MV & Min w/FA-DHA (PRENATAL GUMMIES PO) Take by mouth.     sertraline  (ZOLOFT ) 50 MG tablet Take 1 tablet (50 mg total) by mouth daily. 30 tablet 11   labetalol  (NORMODYNE ) 200 MG tablet Take 1 tablet (200 mg total) by mouth 2 (two) times daily. (Patient not  taking: Reported on 01/12/2024) 60 tablet 3   No current facility-administered medications on file prior to visit.      Objective:     Vitals:   01/12/24 1537  BP: 117/89   Pulse: 93   Filed Weights   01/12/24 1537  Weight: 168 lb 6.4 oz (76.4 kg)                        Assessment:    [id number] Patient Active Problem List   Diagnosis Date Noted   Excessive postpartum bleeding 01/02/2024   Gestational hypertension 12/18/2023   Family history of birth defect (FOB with esophageal atresia) 11/25/2023   Polyhydramnios affecting pregnancy in third trimester 11/06/2023   Pruritus 10/19/2023   Encounter for supervision of normal first pregnancy in third trimester 06/01/2023   Postpartum care and examination 11/25/2021   Labor and delivery, indication for care 11/22/2021   B12 deficiency 07/08/2021   Previous gastric bypass affecting pregnancy, antepartum 03/27/2021   Obesity affecting pregnancy 03/27/2021   Iron  deficiency anemia 11/26/2020   PCOS (polycystic ovarian syndrome) 01/04/2017     1. Postoperative state   2. Postpartum care and examination     Doing well postop and postpartum.  No issues with IUD   Plan:            1.  Recheck at 4 weeks for 6-week postpartum visit  2.  IUDs discussed.  3.  Begin oral iron  to continue to 6 weeks. Orders No orders of the defined types were placed in this encounter.    Meds ordered this encounter  Medications   ferrous sulfate  325 (65 FE) MG tablet    Sig: Take 1 tablet (325 mg total) by mouth 2 (two) times daily with a meal.    Dispense:  60 tablet    Refill:  1      F/U  Return in about 4 weeks (around 02/09/2024).  Delice Felt, M.D. 01/12/2024 4:04 PM

## 2024-01-12 NOTE — Progress Notes (Signed)
 Patient presents today for 2 week postpartum and 1 week postoperative follow-up. Patient had a vaginal delivery followed by [facility] with IUD insertion on 01/03/24. She states breast feeding and bottle feeding are going well. EPDS score of 3. She states no other questions or concerns at this time.

## 2024-01-18 ENCOUNTER — Encounter: Payer: Self-pay | Admitting: Oncology

## 2024-01-19 ENCOUNTER — Inpatient Hospital Stay: Attending: Oncology

## 2024-01-19 DIAGNOSIS — Z9884 Bariatric surgery status: Secondary | ICD-10-CM | POA: Diagnosis not present

## 2024-01-19 DIAGNOSIS — D509 Iron deficiency anemia, unspecified: Secondary | ICD-10-CM | POA: Insufficient documentation

## 2024-01-19 DIAGNOSIS — D508 Other iron deficiency anemias: Secondary | ICD-10-CM

## 2024-01-19 LAB — CBC WITH DIFFERENTIAL (CANCER CENTER ONLY)
Abs Immature Granulocytes: 0.05 10*3/uL (ref 0.00–0.07)
Basophils Absolute: 0.1 10*3/uL (ref 0.0–0.1)
Basophils Relative: 2 %
Eosinophils Absolute: 0.1 10*3/uL (ref 0.0–0.5)
Eosinophils Relative: 2 %
HCT: 22.9 % — ABNORMAL LOW (ref 36.0–46.0)
Hemoglobin: 7 g/dL — ABNORMAL LOW (ref 12.0–15.0)
Immature Granulocytes: 1 %
Lymphocytes Relative: 34 %
Lymphs Abs: 1.7 10*3/uL (ref 0.7–4.0)
MCH: 24.2 pg — ABNORMAL LOW (ref 26.0–34.0)
MCHC: 30.6 g/dL (ref 30.0–36.0)
MCV: 79.2 fL — ABNORMAL LOW (ref 80.0–100.0)
Monocytes Absolute: 0.5 10*3/uL (ref 0.1–1.0)
Monocytes Relative: 9 %
Neutro Abs: 2.7 10*3/uL (ref 1.7–7.7)
Neutrophils Relative %: 52 %
Platelet Count: 394 10*3/uL (ref 150–400)
RBC: 2.89 MIL/uL — ABNORMAL LOW (ref 3.87–5.11)
RDW: 14.4 % (ref 11.5–15.5)
WBC Count: 5.1 10*3/uL (ref 4.0–10.5)
nRBC: 0 % (ref 0.0–0.2)

## 2024-01-19 LAB — VITAMIN B12: Vitamin B-12: 238 pg/mL (ref 180–914)

## 2024-01-19 LAB — IRON AND TIBC
Iron: 18 ug/dL — ABNORMAL LOW (ref 28–170)
Saturation Ratios: 3 % — ABNORMAL LOW (ref 10.4–31.8)
TIBC: 564 ug/dL — ABNORMAL HIGH (ref 250–450)
UIBC: 546 ug/dL

## 2024-01-19 LAB — FERRITIN: Ferritin: 4 ng/mL — ABNORMAL LOW (ref 11–307)

## 2024-01-19 LAB — FOLATE: Folate: 15.7 ng/mL (ref >5.9)

## 2024-01-20 NOTE — Progress Notes (Signed)
  [facility]  [address] [place],  [place]  [address] [phone]  Clinic Day:  01/21/2024  Referring physician: Freida Jes, FNP  TELEPHONE VISIT  HISTORY OF PRESENT ILLNESS:  The patient is a 33 y.o. female  with iron  deficiency anemia.  She last received IV iron  in December/January 2025.  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  She also had gastric bypass surgery a few years ago.   Her telephone visit today is to rediscuss the status of her anemia.  Since her last visit, the patient has been doing okay.  Her cycles remain heavy, but have not been as severe as previously.  PHYSICAL EXAM: deferred   LABS:    Latest Reference Range & Units 01/19/24 10:48  Iron  28 - 170 ug/dL 18 (L)  UIBC ug/dL 130  TIBC 865 - 784 ug/dL 696 (H)  Saturation Ratios 10.4 - 31.8 % 3 (L)  Ferritin 11 - 307 ng/mL 4 (L)  Folate >5.9 ng/mL 15.7  Vitamin B12 180 - 914 pg/mL 238  WBC 4.0 - 10.5 K/uL 5.1  RBC 3.87 - 5.11 MIL/uL 2.89 (L)  Hemoglobin 12.0 - 15.0 g/dL 7.0 (L)  HCT 29.5 - 28.4 % 22.9 (L)  MCV 80.0 - 100.0 fL 79.2 (L)  MCH 26.0 - 34.0 pg 24.2 (L)  MCHC 30.0 - 36.0 g/dL 13.2  RDW 44.0 - 10.2 % 14.4  Platelets 150 - 400 K/uL 394  nRBC 0.0 - 0.2 % 0.0  Neutrophils % 52  Lymphocytes % 34  Monocytes Relative % 9  Eosinophil % 2  Basophil % 2  Immature Granulocytes % 1  NEUT# 1.7 - 7.7 K/uL 2.7  Lymphs Abs 0.7 - 4.0 K/uL 1.7  Monocyte # 0.1 - 1.0 K/uL 0.5  Eosinophils Absolute 0.0 - 0.5 K/uL 0.1  Basophils Absolute 0.0 - 0.1 K/uL 0.1  Abs Immature Granulocytes 0.00 - 0.07 K/uL 0.05  (L): Data is abnormally low (H): Data is abnormally high  ASSESSMENT & PLAN:  A 33 y.o. female with iron  deficiency anemia, likely related to her previous gastric bypass surgery and heavy cycles.  When evaluating her recent labs, she clearly has redeveloped iron  deficiency anemia.  Based upon this, I  will arrange for her to receive IV iron  over the next few weeks to rapidly replenish her iron  stores and improve her hemoglobin.  I will see her back in 3 months to see how well she responded to her upcoming IV iron .  The patient understands all the plans discussed today and is in agreement with them.   Kristine Tiley Felicia Horde, MD

## 2024-01-21 ENCOUNTER — Inpatient Hospital Stay: Admitting: Oncology

## 2024-01-21 DIAGNOSIS — D509 Iron deficiency anemia, unspecified: Secondary | ICD-10-CM | POA: Diagnosis not present

## 2024-01-21 DIAGNOSIS — D508 Other iron deficiency anemias: Secondary | ICD-10-CM | POA: Diagnosis not present

## 2024-01-22 ENCOUNTER — Inpatient Hospital Stay

## 2024-01-22 VITALS — BP 125/65 | HR 74 | Temp 98.1°F | Resp 18

## 2024-01-22 DIAGNOSIS — D509 Iron deficiency anemia, unspecified: Secondary | ICD-10-CM | POA: Diagnosis not present

## 2024-01-22 MED ORDER — SODIUM CHLORIDE 0.9 % IV SOLN: INTRAVENOUS | Status: DC

## 2024-01-22 MED ORDER — SODIUM CHLORIDE 0.9 % IV SOLN
510.0000 mg | Freq: Once | INTRAVENOUS | Status: AC
  Administered 2024-01-22: 510 mg via INTRAVENOUS
  Filled 2024-01-22: qty 510

## 2024-01-22 NOTE — Patient Instructions (Signed)

## 2024-01-29 ENCOUNTER — Inpatient Hospital Stay

## 2024-01-29 VITALS — BP 119/64 | HR 75 | Temp 98.6°F | Resp 18

## 2024-01-29 DIAGNOSIS — D509 Iron deficiency anemia, unspecified: Secondary | ICD-10-CM | POA: Diagnosis not present

## 2024-01-29 MED ORDER — SODIUM CHLORIDE 0.9 % IV SOLN
510.0000 mg | Freq: Once | INTRAVENOUS | Status: AC
  Administered 2024-01-29: 510 mg via INTRAVENOUS
  Filled 2024-01-29: qty 510

## 2024-01-29 MED ORDER — SODIUM CHLORIDE 0.9 % IV SOLN: INTRAVENOUS | Status: DC

## 2024-01-29 NOTE — Patient Instructions (Signed)

## 2024-02-04 NOTE — Progress Notes (Unsigned)
   OBSTETRICS POSTPARTUM CLINIC PROGRESS NOTE  Subjective:     Wendy Johnston is a 33 y.o. G22P2002 female who presents for a postpartum visit. She is 6 weeks postpartum following a spontaneous vaginal delivery. I have fully reviewed the prenatal and intrapartum course. The delivery was at 38.1 gestational weeks.  Anesthesia: none. Postpartum course was complicated with retained products and secondary postpartum hemorrhage at 2 weeks. Had D&C and iron  infusions. Baby is feeding by bottle - NeuroPro. Bleeding: patient has not not resumed menses. Bowel function is normal. Bladder function is normal. Patient is sexually active with no complaints. Contraception method desired is IUD. Postpartum depression screening: negative.  EDPS score is 5.    The following portions of the patient's history were reviewed and updated as appropriate: allergies, current medications, past family history, past medical history, past social history, past surgical history, and problem list.  Review of Systems Pertinent items are noted in HPI.   Objective:    BP 121/81   Resp 16   Ht 5\' 1"  (1.549 m)   Wt 176 lb 6.4 oz (80 kg)   LMP 04/03/2023 (Exact Date)   Breastfeeding No   BMI 33.33 kg/m   General:  alert and no distress   Breasts:  inspection negative, no nipple discharge or bleeding, no masses or nodularity palpable  Lungs: clear to auscultation bilaterally  Heart:  regular rate and rhythm, S1, S2 normal, no murmur, click, rub or gallop  Abdomen: soft, non-tender; bowel sounds normal; no masses,  no organomegaly.  Well healed Pfannenstiel incision   Vulva:  normal  Vagina: normal vagina, no discharge, exudate, lesion, or erythema  Cervix:  no cervical motion tenderness and no lesions  Corpus: normal size, contour, position, consistency, mobility, non-tender  Adnexa:  normal adnexa and no mass, fullness, tenderness  Rectal Exam: Not performed.        Labs:  Lab Results  Component Value Date    HGB 10.5 (A) 02/05/2024     Assessment:   1. Postpartum care and examination   2. Other iron  deficiency anemia      Plan:    1. Contraception: IUD 2. Will check Hgb for h/o postpartum anemia of less than 10. POCT - 10.5 3. Follow up in: 1 Year for physical or as needed.  4. Return to normal activities without restriction.  5. Plans to removed IUD at one year. Has used provera  and letrozole  to conceive her two children.    Donato Fu, CNM [facility]

## 2024-02-04 NOTE — Patient Instructions (Signed)

## 2024-02-05 ENCOUNTER — Encounter: Payer: Self-pay | Admitting: Oncology

## 2024-02-05 ENCOUNTER — Ambulatory Visit (INDEPENDENT_AMBULATORY_CARE_PROVIDER_SITE_OTHER): Admitting: Certified Nurse Midwife

## 2024-02-05 DIAGNOSIS — D508 Other iron deficiency anemias: Secondary | ICD-10-CM | POA: Diagnosis not present

## 2024-02-05 LAB — POCT HEMOGLOBIN: Hemoglobin: 10.5 g/dL — AB (ref 11–14.6)

## 2024-04-22 ENCOUNTER — Inpatient Hospital Stay: Admitting: Oncology

## 2024-04-22 ENCOUNTER — Inpatient Hospital Stay

## 2024-05-09 NOTE — Progress Notes (Unsigned)
  [facility] [address] [place],  [place]  [address] [phone]   Clinic Day:  05/10/2024  Referring physician: Orlando Dwayne NOVAK, FNP   HISTORY OF PRESENT ILLNESS:  The patient is a 33 y.o. female  with iron  deficiency anemia.  She last received IV iron  in May 2025.  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  She also had gastric bypass surgery a few years ago.  She comes in today reassess her iron  deficiency anemia.  Since her last visit, the patient has been doing okay.  Her cycles remain heavy, but have not been as severe as previously.  PHYSICAL EXAM: deferred   LABS:    Latest Reference Range & Units 01/19/24 10:48  Iron  28 - 170 ug/dL 18 (L)  UIBC ug/dL 453  TIBC 749 - 549 ug/dL 435 (H)  Saturation Ratios 10.4 - 31.8 % 3 (L)  Ferritin 11 - 307 ng/mL 4 (L)  Folate >5.9 ng/mL 15.7  Vitamin B12 180 - 914 pg/mL 238  WBC 4.0 - 10.5 K/uL 5.1  RBC 3.87 - 5.11 MIL/uL 2.89 (L)  Hemoglobin 12.0 - 15.0 g/dL 7.0 (L)  HCT 63.9 - 53.9 % 22.9 (L)  MCV 80.0 - 100.0 fL 79.2 (L)  MCH 26.0 - 34.0 pg 24.2 (L)  MCHC 30.0 - 36.0 g/dL 69.3  RDW 88.4 - 84.4 % 14.4  Platelets 150 - 400 K/uL 394  nRBC 0.0 - 0.2 % 0.0  Neutrophils % 52  Lymphocytes % 34  Monocytes Relative % 9  Eosinophil % 2  Basophil % 2  Immature Granulocytes % 1  NEUT# 1.7 - 7.7 K/uL 2.7  Lymphs Abs 0.7 - 4.0 K/uL 1.7  Monocyte # 0.1 - 1.0 K/uL 0.5  Eosinophils Absolute 0.0 - 0.5 K/uL 0.1  Basophils Absolute 0.0 - 0.1 K/uL 0.1  Abs Immature Granulocytes 0.00 - 0.07 K/uL 0.05  (L): Data is abnormally low (H): Data is abnormally high  ASSESSMENT & PLAN:  A 33 y.o. female with iron  deficiency anemia, likely related to her previous gastric bypass surgery and heavy cycles.  When evaluating her recent labs, she clearly has redeveloped iron  deficiency anemia.  Based upon this, I will arrange for her to receive IV iron  over the  next few weeks to rapidly replenish her iron  stores and improve her hemoglobin.  I will see her back in 3 months to see how well she responded to her upcoming IV iron .  The patient understands all the plans discussed today and is in agreement with them.   Jolette Lana DELENA Kerns, MD

## 2024-05-10 ENCOUNTER — Inpatient Hospital Stay: Attending: Oncology

## 2024-05-10 ENCOUNTER — Inpatient Hospital Stay (HOSPITAL_BASED_OUTPATIENT_CLINIC_OR_DEPARTMENT_OTHER): Admitting: Oncology

## 2024-05-10 VITALS — BP 116/82 | HR 59 | Temp 97.5°F | Resp 16 | Ht 61.0 in | Wt 153.0 lb

## 2024-05-10 DIAGNOSIS — Z9884 Bariatric surgery status: Secondary | ICD-10-CM | POA: Insufficient documentation

## 2024-05-10 DIAGNOSIS — D508 Other iron deficiency anemias: Secondary | ICD-10-CM

## 2024-05-10 DIAGNOSIS — D509 Iron deficiency anemia, unspecified: Secondary | ICD-10-CM | POA: Diagnosis present

## 2024-05-10 DIAGNOSIS — D259 Leiomyoma of uterus, unspecified: Secondary | ICD-10-CM | POA: Insufficient documentation

## 2024-05-10 LAB — CBC WITH DIFFERENTIAL (CANCER CENTER ONLY)
Abs Immature Granulocytes: 0.01 K/uL (ref 0.00–0.07)
Basophils Absolute: 0 K/uL (ref 0.0–0.1)
Basophils Relative: 1 %
Eosinophils Absolute: 0.1 K/uL (ref 0.0–0.5)
Eosinophils Relative: 1 %
HCT: 38.1 % (ref 36.0–46.0)
Hemoglobin: 12.6 g/dL (ref 12.0–15.0)
Immature Granulocytes: 0 %
Lymphocytes Relative: 31 %
Lymphs Abs: 1.7 K/uL (ref 0.7–4.0)
MCH: 26.4 pg (ref 26.0–34.0)
MCHC: 33.1 g/dL (ref 30.0–36.0)
MCV: 79.7 fL — ABNORMAL LOW (ref 80.0–100.0)
Monocytes Absolute: 0.4 K/uL (ref 0.1–1.0)
Monocytes Relative: 7 %
Neutro Abs: 3.2 K/uL (ref 1.7–7.7)
Neutrophils Relative %: 60 %
Platelet Count: 261 K/uL (ref 150–400)
RBC: 4.78 MIL/uL (ref 3.87–5.11)
RDW: 13.4 % (ref 11.5–15.5)
WBC Count: 5.3 K/uL (ref 4.0–10.5)
nRBC: 0 % (ref 0.0–0.2)

## 2024-05-10 LAB — FERRITIN: Ferritin: 40 ng/mL (ref 11–307)

## 2024-05-10 LAB — IRON AND TIBC
Iron: 44 ug/dL (ref 28–170)
Saturation Ratios: 15 % (ref 10.4–31.8)
TIBC: 290 ug/dL (ref 250–450)
UIBC: 246 ug/dL

## 2024-05-10 LAB — VITAMIN B12: Vitamin B-12: 376 pg/mL (ref 180–914)

## 2024-05-10 LAB — FOLATE: Folate: 11.4 ng/mL (ref >5.9)

## 2024-05-10 NOTE — Progress Notes (Unsigned)
 [facility] [address] [place],  [place]  [address] [phone]  Clinic Day:  05/10/2024  Referring physician: Orlando Dwayne NOVAK, FNP   HISTORY OF PRESENT ILLNESS:  The patient is a 33 y.o. female with 33 y.o. female  with iron  deficiency anemia.  She last received IV iron  in May 2025.  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  She also had gastric bypass surgery a few years ago.  She comes in today reassess her iron  deficiency anemia after receiving her IV iron .  Since receiving it, she has felt much better.  She denies having ice cravings, restless leg syndrome, or other symptoms which usually highlight persistence of her iron  deficiency anemia.  PHYSICAL EXAM:  Blood pressure 116/82, pulse (!) 59, temperature (!) 97.5 F (36.4 C), temperature source Oral, resp. rate 16, height 5' 1 (1.549 m), weight 153 lb (69.4 kg), SpO2 100%, not currently breastfeeding. Wt Readings from Last 3 Encounters:  05/10/24 153 lb (69.4 kg)  02/05/24 176 lb 6.4 oz (80 kg)  01/12/24 168 lb 6.4 oz (76.4 kg)   Body mass index is 28.91 kg/m. Performance status (ECOG): 0 - Asymptomatic Physical Exam Constitutional:      Appearance: Normal appearance. She is not ill-appearing.  HENT:     Mouth/Throat:     Mouth: Mucous membranes are moist.     Pharynx: Oropharynx is clear. No oropharyngeal exudate or posterior oropharyngeal erythema.  Cardiovascular:     Rate and Rhythm: Normal rate and regular rhythm.     Heart sounds: No murmur heard.    No friction rub. No gallop.  Pulmonary:     Effort: Pulmonary effort is normal. No respiratory distress.     Breath sounds: Normal breath sounds. No wheezing, rhonchi or rales.  Abdominal:     General: Bowel sounds are normal. There is no distension.     Palpations: Abdomen is soft. There is no mass.     Tenderness: There is no abdominal tenderness.  Musculoskeletal:         General: No swelling.     Right lower leg: No edema.     Left lower leg: No edema.  Lymphadenopathy:     Cervical: No cervical adenopathy.     Upper Body:     Right upper body: No supraclavicular or axillary adenopathy.     Left upper body: No supraclavicular or axillary adenopathy.     Lower Body: No right inguinal adenopathy. No left inguinal adenopathy.  Skin:    General: Skin is warm.     Coloration: Skin is not jaundiced.     Findings: No lesion or rash.  Neurological:     General: No focal deficit present.     Mental Status: She is alert and oriented to person, place, and time. Mental status is at baseline.  Psychiatric:        Mood and Affect: Mood normal.        Behavior: Behavior normal.        Thought Content: Thought content normal.    LABS:      Latest Ref Rng & Units 05/10/2024    4:02 PM 02/05/2024    9:52 AM 01/19/2024   10:48 AM  CBC  WBC 4.0 - 10.5 K/uL 5.3   5.1   Hemoglobin 12.0 - 15.0 g/dL 87.3  89.4  7.0   Hematocrit 36.0 - 46.0 % 38.1   22.9  Platelets 150 - 400 K/uL 261   394       Latest Ref Rng & Units 01/02/2024    7:28 PM 12/26/2023    8:57 AM 12/18/2023    5:01 PM  CMP  Glucose 70 - 99 mg/dL 92  858  86   BUN 6 - 20 mg/dL 14  10  12    Creatinine 0.44 - 1.00 mg/dL 9.43  9.37  9.10   Sodium 135 - 145 mmol/L 136  133  135   Potassium 3.5 - 5.1 mmol/L 4.1  3.6  4.1   Chloride 98 - 111 mmol/L 103  107  106   CO2 22 - 32 mmol/L 23  19  22    Calcium 8.9 - 10.3 mg/dL 9.0  8.3  8.7   Total Protein 6.5 - 8.1 g/dL 6.9  5.7  6.6   Total Bilirubin 0.0 - 1.2 mg/dL 0.5  0.5  0.5   Alkaline Phos 38 - 126 U/L 87  95  93   AST 15 - 41 U/L 21  23  23    ALT 0 - 44 U/L 21  12  12     Iron  b12 folate 6 months studies pending  ASSESSMENT & PLAN:  Assessment/Plan:  A 33 y.o. female with iron  deficiency anemia related to her previous gastric bypass surgery and heavy menstrual cycles.  When evaluating her recent labs, I am very pleased with the improvement in  her hemoglobin and iron  parameters.  Clinically, the patient appears to be doing much better.  I will see her back in 4 months for repeat clinical assessment.  The patient understands all the plans discussed today and is in agreement with them.   Phuong Hillary DELENA Kerns, MD

## 2024-05-11 ENCOUNTER — Telehealth: Payer: Self-pay | Admitting: Oncology

## 2024-05-11 ENCOUNTER — Encounter: Payer: Self-pay | Admitting: Oncology

## 2024-05-11 NOTE — Telephone Encounter (Signed)
 Patient has been scheduled for follow-up visit per 05/10/24 LOS.  LVM notifying pt of appt details, provided my direct number to pt if appt changes need to be made.

## 2024-05-16 ENCOUNTER — Encounter: Payer: Self-pay | Admitting: Oncology

## 2024-09-11 NOTE — Progress Notes (Unsigned)
 " [facility] at [facility] [address] [place],  [place]  [address] [phone]  Clinic Day:  09/12/2024  Referring physician: Orlando Dwayne NOVAK, FNP   HISTORY OF PRESENT ILLNESS:  The patient is a 34 y.o. female with 34 y.o. female  with iron  deficiency anemia.  She last received IV iron  in May 2025.  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  She also had gastric bypass surgery a few years ago.  She comes in today reassess her iron  deficiency anemia.  Since her last visit, the patient has been doing well.  She denies having any overt forms of blood loss or increased fatigue which concerns her for recurrent iron  deficiency anemia.  Of note, this patient has an IUD placed, which has prevented her from having additional menstrual cycles in numerous months.  PHYSICAL EXAM:  Blood pressure 119/75, pulse 67, temperature 98.1 F (36.7 C), temperature source Oral, resp. rate 14, height 5' 1 (1.549 m), weight 138 lb 11.2 oz (62.9 kg), SpO2 100%, not currently breastfeeding. Wt Readings from Last 3 Encounters:  09/12/24 138 lb 11.2 oz (62.9 kg)  05/10/24 153 lb (69.4 kg)  02/05/24 176 lb 6.4 oz (80 kg)   Body mass index is 26.21 kg/m. Performance status (ECOG): 0 - Asymptomatic Physical Exam Constitutional:      Appearance: Normal appearance. She is not ill-appearing.  HENT:     Mouth/Throat:     Mouth: Mucous membranes are moist.     Pharynx: Oropharynx is clear. No oropharyngeal exudate or posterior oropharyngeal erythema.  Cardiovascular:     Rate and Rhythm: Normal rate and regular rhythm.     Heart sounds: No murmur heard.    No friction rub. No gallop.  Pulmonary:     Effort: Pulmonary effort is normal. No respiratory distress.     Breath sounds: Normal breath sounds. No wheezing, rhonchi or rales.  Abdominal:     General: Bowel sounds are normal. There is no distension.     Palpations: Abdomen is  soft. There is no mass.     Tenderness: There is no abdominal tenderness.  Musculoskeletal:        General: No swelling.     Right lower leg: No edema.     Left lower leg: No edema.  Lymphadenopathy:     Cervical: No cervical adenopathy.     Upper Body:     Right upper body: No supraclavicular or axillary adenopathy.     Left upper body: No supraclavicular or axillary adenopathy.     Lower Body: No right inguinal adenopathy. No left inguinal adenopathy.  Skin:    General: Skin is warm.     Coloration: Skin is not jaundiced.     Findings: No lesion or rash.  Neurological:     General: No focal deficit present.     Mental Status: She is alert and oriented to person, place, and time. Mental status is at baseline.  Psychiatric:        Mood and Affect: Mood normal.        Behavior: Behavior normal.        Thought Content: Thought content normal.    LABS:      Latest Ref Rng & Units 09/12/2024    4:03 PM 05/10/2024    4:02 PM 02/05/2024    9:52 AM  CBC  WBC 4.0 - 10.5 K/uL 5.7  5.3    Hemoglobin  12.0 - 15.0 g/dL 87.0  87.3  89.4   Hematocrit 36.0 - 46.0 % 38.4  38.1    Platelets 150 - 400 K/uL 281  261        Latest Ref Rng & Units 01/02/2024    7:28 PM 12/26/2023    8:57 AM 12/18/2023    5:01 PM  CMP  Glucose 70 - 99 mg/dL 92  858  86   BUN 6 - 20 mg/dL 14  10  12    Creatinine 0.44 - 1.00 mg/dL 9.43  9.37  9.10   Sodium 135 - 145 mmol/L 136  133  135   Potassium 3.5 - 5.1 mmol/L 4.1  3.6  4.1   Chloride 98 - 111 mmol/L 103  107  106   CO2 22 - 32 mmol/L 23  19  22    Calcium 8.9 - 10.3 mg/dL 9.0  8.3  8.7   Total Protein 6.5 - 8.1 g/dL 6.9  5.7  6.6   Total Bilirubin 0.0 - 1.2 mg/dL 0.5  0.5  0.5   Alkaline Phos 38 - 126 U/L 87  95  93   AST 15 - 41 U/L 21  23  23    ALT 0 - 44 U/L 21  12  12      Latest Reference Range & Units 09/12/24 16:03  Iron  28 - 170 ug/dL 30  UIBC ug/dL 723  TIBC 749 - 549 ug/dL 694  Saturation Ratios 10.4 - 31.8 % 10 (L)  Ferritin 11 - 307  ng/mL 62  (L): Data is abnormally low  ASSESSMENT & PLAN:  Assessment/Plan:  A 34 y.o. female with iron  deficiency anemia related to her previous gastric bypass surgery and heavy menstrual cycles.  When evaluating her recent labs, I am pleased as her hemoglobin continues to improve.  However, some of her iron  parameters are slightly lower.  For now, her iron  parameters will continue to be followed conservatively.  Clinically, the patient is doing well.  I will see her back in 6 months for repeat clinical assessment.  The patient understands all the plans discussed today and is in agreement with them.  Ivania Teagarden DELENA Kerns, MD       "

## 2024-09-12 ENCOUNTER — Inpatient Hospital Stay

## 2024-09-12 ENCOUNTER — Inpatient Hospital Stay: Attending: Oncology | Admitting: Oncology

## 2024-09-12 VITALS — BP 119/75 | HR 67 | Temp 98.1°F | Resp 14 | Ht 61.0 in | Wt 138.7 lb

## 2024-09-12 DIAGNOSIS — D500 Iron deficiency anemia secondary to blood loss (chronic): Secondary | ICD-10-CM | POA: Diagnosis not present

## 2024-09-12 DIAGNOSIS — D508 Other iron deficiency anemias: Secondary | ICD-10-CM

## 2024-09-12 LAB — CBC WITH DIFFERENTIAL (CANCER CENTER ONLY)
Abs Immature Granulocytes: 0.01 K/uL (ref 0.00–0.07)
Basophils Absolute: 0.1 K/uL (ref 0.0–0.1)
Basophils Relative: 1 %
Eosinophils Absolute: 0.1 K/uL (ref 0.0–0.5)
Eosinophils Relative: 1 %
HCT: 38.4 % (ref 36.0–46.0)
Hemoglobin: 12.9 g/dL (ref 12.0–15.0)
Immature Granulocytes: 0 %
Lymphocytes Relative: 27 %
Lymphs Abs: 1.5 K/uL (ref 0.7–4.0)
MCH: 28.2 pg (ref 26.0–34.0)
MCHC: 33.6 g/dL (ref 30.0–36.0)
MCV: 84 fL (ref 80.0–100.0)
Monocytes Absolute: 0.4 K/uL (ref 0.1–1.0)
Monocytes Relative: 7 %
Neutro Abs: 3.6 K/uL (ref 1.7–7.7)
Neutrophils Relative %: 64 %
Platelet Count: 281 K/uL (ref 150–400)
RBC: 4.57 MIL/uL (ref 3.87–5.11)
RDW: 11.9 % (ref 11.5–15.5)
WBC Count: 5.7 K/uL (ref 4.0–10.5)
nRBC: 0 % (ref 0.0–0.2)

## 2024-09-12 LAB — IRON AND TIBC
Iron: 30 ug/dL (ref 28–170)
Saturation Ratios: 10 % — ABNORMAL LOW (ref 10.4–31.8)
TIBC: 305 ug/dL (ref 250–450)
UIBC: 276 ug/dL

## 2024-09-12 LAB — FERRITIN: Ferritin: 62 ng/mL (ref 11–307)

## 2024-09-13 ENCOUNTER — Encounter: Payer: Self-pay | Admitting: Oncology

## 2025-03-13 ENCOUNTER — Inpatient Hospital Stay

## 2025-03-13 ENCOUNTER — Inpatient Hospital Stay: Admitting: Oncology
